# Patient Record
Sex: Male | Born: 1955 | Race: White | Hispanic: No | Smoking: Former smoker
Health system: Southern US, Community
[De-identification: ages and names within clinical notes are randomized; demographics above are authoritative.]

## PROBLEM LIST (undated history)

## (undated) DIAGNOSIS — E7800 Pure hypercholesterolemia, unspecified: Secondary | ICD-10-CM

## (undated) DIAGNOSIS — C61 Malignant neoplasm of prostate: Secondary | ICD-10-CM

## (undated) DIAGNOSIS — F419 Anxiety disorder, unspecified: Secondary | ICD-10-CM

## (undated) DIAGNOSIS — F319 Bipolar disorder, unspecified: Secondary | ICD-10-CM

## (undated) DIAGNOSIS — C3491 Malignant neoplasm of unspecified part of right bronchus or lung: Secondary | ICD-10-CM

## (undated) DIAGNOSIS — I2510 Atherosclerotic heart disease of native coronary artery without angina pectoris: Secondary | ICD-10-CM

## (undated) DIAGNOSIS — E039 Hypothyroidism, unspecified: Secondary | ICD-10-CM

## (undated) DIAGNOSIS — E230 Hypopituitarism: Secondary | ICD-10-CM

## (undated) DIAGNOSIS — F32A Depression, unspecified: Secondary | ICD-10-CM

## (undated) DIAGNOSIS — R972 Elevated prostate specific antigen [PSA]: Secondary | ICD-10-CM

## (undated) DIAGNOSIS — C801 Malignant (primary) neoplasm, unspecified: Secondary | ICD-10-CM

## (undated) DIAGNOSIS — F329 Major depressive disorder, single episode, unspecified: Secondary | ICD-10-CM

## (undated) HISTORY — PX: JOINT REPLACEMENT: SHX530

## (undated) HISTORY — DX: Bipolar disorder, unspecified: F31.9

## (undated) HISTORY — DX: Atherosclerotic heart disease of native coronary artery without angina pectoris: I25.10

## (undated) HISTORY — DX: Malignant neoplasm of unspecified part of right bronchus or lung: C34.91

## (undated) HISTORY — DX: Hypothyroidism, unspecified: E03.9

## (undated) HISTORY — DX: Anxiety disorder, unspecified: F41.9

## (undated) HISTORY — DX: Hypopituitarism: E23.0

## (undated) HISTORY — DX: Pure hypercholesterolemia, unspecified: E78.00

---

## 1998-03-27 ENCOUNTER — Emergency Department (HOSPITAL_COMMUNITY): Admission: EM | Admit: 1998-03-27 | Discharge: 1998-03-27 | Payer: Self-pay | Admitting: Emergency Medicine

## 2013-01-01 ENCOUNTER — Ambulatory Visit
Admission: RE | Admit: 2013-01-01 | Discharge: 2013-01-01 | Disposition: A | Discharge status: Home / Self Care | Payer: Managed Care, Other (non HMO) | Source: Ambulatory Visit | Attending: Neurosurgery | Admitting: Neurosurgery

## 2013-01-01 ENCOUNTER — Other Ambulatory Visit: Payer: Self-pay | Admitting: Neurosurgery

## 2013-01-01 DIAGNOSIS — R1900 Intra-abdominal and pelvic swelling, mass and lump, unspecified site: Secondary | ICD-10-CM

## 2013-01-01 MED ORDER — IOHEXOL 300 MG/ML  SOLN
100.0000 mL | Freq: Once | INTRAMUSCULAR | Status: AC | PRN
  Administered 2013-01-01: 100 mL via INTRAVENOUS

## 2013-01-01 MED ORDER — IOHEXOL 300 MG/ML  SOLN
30.0000 mL | Freq: Once | INTRAMUSCULAR | Status: AC | PRN
  Administered 2013-01-01: 30 mL via ORAL

## 2013-01-05 ENCOUNTER — Other Ambulatory Visit (HOSPITAL_COMMUNITY): Payer: Self-pay | Admitting: Neurosurgery

## 2013-01-05 DIAGNOSIS — M7989 Other specified soft tissue disorders: Secondary | ICD-10-CM

## 2013-06-02 ENCOUNTER — Ambulatory Visit: Payer: Managed Care, Other (non HMO) | Attending: Orthopedic Surgery | Admitting: Physical Therapy

## 2013-06-02 DIAGNOSIS — M25559 Pain in unspecified hip: Secondary | ICD-10-CM | POA: Insufficient documentation

## 2013-06-02 DIAGNOSIS — IMO0001 Reserved for inherently not codable concepts without codable children: Secondary | ICD-10-CM | POA: Insufficient documentation

## 2013-06-02 DIAGNOSIS — R5381 Other malaise: Secondary | ICD-10-CM | POA: Insufficient documentation

## 2013-06-02 DIAGNOSIS — R262 Difficulty in walking, not elsewhere classified: Secondary | ICD-10-CM | POA: Insufficient documentation

## 2013-06-04 ENCOUNTER — Ambulatory Visit: Payer: Managed Care, Other (non HMO) | Admitting: Physical Therapy

## 2013-06-11 ENCOUNTER — Ambulatory Visit: Payer: Managed Care, Other (non HMO) | Admitting: Physical Therapy

## 2013-06-12 ENCOUNTER — Ambulatory Visit: Payer: Managed Care, Other (non HMO) | Admitting: Physical Therapy

## 2013-06-16 ENCOUNTER — Ambulatory Visit: Payer: Managed Care, Other (non HMO) | Admitting: Physical Therapy

## 2013-06-18 ENCOUNTER — Ambulatory Visit: Payer: Managed Care, Other (non HMO) | Admitting: Physical Therapy

## 2013-06-23 ENCOUNTER — Ambulatory Visit: Payer: Managed Care, Other (non HMO) | Attending: Orthopedic Surgery | Admitting: Physical Therapy

## 2013-06-23 DIAGNOSIS — IMO0001 Reserved for inherently not codable concepts without codable children: Secondary | ICD-10-CM | POA: Insufficient documentation

## 2013-06-23 DIAGNOSIS — R262 Difficulty in walking, not elsewhere classified: Secondary | ICD-10-CM | POA: Insufficient documentation

## 2013-06-23 DIAGNOSIS — R5381 Other malaise: Secondary | ICD-10-CM | POA: Insufficient documentation

## 2013-06-23 DIAGNOSIS — M25559 Pain in unspecified hip: Secondary | ICD-10-CM | POA: Insufficient documentation

## 2013-06-25 ENCOUNTER — Ambulatory Visit: Payer: Managed Care, Other (non HMO) | Admitting: Physical Therapy

## 2013-06-30 ENCOUNTER — Encounter: Payer: Managed Care, Other (non HMO) | Admitting: Physical Therapy

## 2013-07-02 ENCOUNTER — Encounter: Payer: Managed Care, Other (non HMO) | Admitting: Physical Therapy

## 2013-07-07 ENCOUNTER — Encounter: Payer: Managed Care, Other (non HMO) | Admitting: Physical Therapy

## 2013-07-07 ENCOUNTER — Ambulatory Visit: Payer: Managed Care, Other (non HMO)

## 2013-07-09 ENCOUNTER — Encounter: Payer: Managed Care, Other (non HMO) | Admitting: Physical Therapy

## 2013-07-13 DIAGNOSIS — E2740 Unspecified adrenocortical insufficiency: Secondary | ICD-10-CM | POA: Insufficient documentation

## 2013-09-07 ENCOUNTER — Ambulatory Visit: Payer: Managed Care, Other (non HMO) | Attending: Medical Oncology | Admitting: Physical Therapy

## 2013-09-07 DIAGNOSIS — IMO0001 Reserved for inherently not codable concepts without codable children: Secondary | ICD-10-CM | POA: Insufficient documentation

## 2013-09-07 DIAGNOSIS — M25669 Stiffness of unspecified knee, not elsewhere classified: Secondary | ICD-10-CM | POA: Insufficient documentation

## 2013-09-07 DIAGNOSIS — C3490 Malignant neoplasm of unspecified part of unspecified bronchus or lung: Secondary | ICD-10-CM | POA: Insufficient documentation

## 2013-09-07 DIAGNOSIS — M25559 Pain in unspecified hip: Secondary | ICD-10-CM | POA: Insufficient documentation

## 2013-09-17 ENCOUNTER — Ambulatory Visit: Payer: Managed Care, Other (non HMO) | Admitting: Physical Therapy

## 2013-09-18 ENCOUNTER — Ambulatory Visit: Payer: Managed Care, Other (non HMO) | Admitting: Physical Therapy

## 2013-09-24 ENCOUNTER — Ambulatory Visit: Payer: Managed Care, Other (non HMO) | Attending: Medical Oncology | Admitting: Physical Therapy

## 2013-09-24 DIAGNOSIS — C3490 Malignant neoplasm of unspecified part of unspecified bronchus or lung: Secondary | ICD-10-CM | POA: Insufficient documentation

## 2013-09-24 DIAGNOSIS — M25669 Stiffness of unspecified knee, not elsewhere classified: Secondary | ICD-10-CM | POA: Insufficient documentation

## 2013-09-24 DIAGNOSIS — M25559 Pain in unspecified hip: Secondary | ICD-10-CM | POA: Insufficient documentation

## 2013-09-24 DIAGNOSIS — IMO0001 Reserved for inherently not codable concepts without codable children: Secondary | ICD-10-CM | POA: Insufficient documentation

## 2013-09-25 ENCOUNTER — Ambulatory Visit: Payer: Managed Care, Other (non HMO) | Admitting: Physical Therapy

## 2013-09-30 ENCOUNTER — Ambulatory Visit: Payer: Managed Care, Other (non HMO) | Admitting: Physical Therapy

## 2013-10-01 ENCOUNTER — Ambulatory Visit: Payer: Managed Care, Other (non HMO) | Admitting: Physical Therapy

## 2013-10-02 ENCOUNTER — Ambulatory Visit: Payer: Managed Care, Other (non HMO) | Admitting: Physical Therapy

## 2013-10-08 ENCOUNTER — Ambulatory Visit: Payer: Managed Care, Other (non HMO) | Admitting: Physical Therapy

## 2013-10-15 ENCOUNTER — Ambulatory Visit: Payer: Managed Care, Other (non HMO) | Admitting: Physical Therapy

## 2013-10-21 ENCOUNTER — Ambulatory Visit: Payer: Managed Care, Other (non HMO) | Attending: Medical Oncology | Admitting: Physical Therapy

## 2013-10-21 DIAGNOSIS — M25669 Stiffness of unspecified knee, not elsewhere classified: Secondary | ICD-10-CM | POA: Insufficient documentation

## 2013-10-21 DIAGNOSIS — C3490 Malignant neoplasm of unspecified part of unspecified bronchus or lung: Secondary | ICD-10-CM | POA: Insufficient documentation

## 2013-10-21 DIAGNOSIS — M25559 Pain in unspecified hip: Secondary | ICD-10-CM | POA: Insufficient documentation

## 2013-10-21 DIAGNOSIS — IMO0001 Reserved for inherently not codable concepts without codable children: Secondary | ICD-10-CM | POA: Insufficient documentation

## 2013-10-22 ENCOUNTER — Ambulatory Visit: Payer: Managed Care, Other (non HMO) | Admitting: Physical Therapy

## 2013-10-29 ENCOUNTER — Ambulatory Visit: Payer: Managed Care, Other (non HMO) | Admitting: Physical Therapy

## 2013-11-04 ENCOUNTER — Ambulatory Visit: Payer: Managed Care, Other (non HMO) | Admitting: Physical Therapy

## 2013-11-12 ENCOUNTER — Ambulatory Visit: Payer: Managed Care, Other (non HMO) | Admitting: Physical Therapy

## 2013-11-18 ENCOUNTER — Ambulatory Visit: Payer: Managed Care, Other (non HMO) | Attending: Medical Oncology | Admitting: Physical Therapy

## 2013-11-18 DIAGNOSIS — C3490 Malignant neoplasm of unspecified part of unspecified bronchus or lung: Secondary | ICD-10-CM | POA: Insufficient documentation

## 2013-11-18 DIAGNOSIS — IMO0001 Reserved for inherently not codable concepts without codable children: Secondary | ICD-10-CM | POA: Insufficient documentation

## 2013-11-18 DIAGNOSIS — M25669 Stiffness of unspecified knee, not elsewhere classified: Secondary | ICD-10-CM | POA: Insufficient documentation

## 2013-11-18 DIAGNOSIS — M25559 Pain in unspecified hip: Secondary | ICD-10-CM | POA: Insufficient documentation

## 2013-11-25 ENCOUNTER — Ambulatory Visit: Payer: Managed Care, Other (non HMO) | Admitting: Physical Therapy

## 2013-11-27 ENCOUNTER — Ambulatory Visit: Payer: Managed Care, Other (non HMO) | Admitting: Physical Therapy

## 2014-09-08 ENCOUNTER — Ambulatory Visit (INDEPENDENT_AMBULATORY_CARE_PROVIDER_SITE_OTHER): Payer: Managed Care, Other (non HMO) | Admitting: Psychiatry

## 2014-09-08 DIAGNOSIS — F0630 Mood disorder due to known physiological condition, unspecified: Secondary | ICD-10-CM

## 2015-04-18 DIAGNOSIS — C799 Secondary malignant neoplasm of unspecified site: Secondary | ICD-10-CM | POA: Insufficient documentation

## 2016-07-13 ENCOUNTER — Inpatient Hospital Stay (HOSPITAL_COMMUNITY)
Admission: EM | Admit: 2016-07-13 | Discharge: 2016-07-14 | DRG: 054 | Disposition: A | Discharge status: Home / Self Care | Payer: Managed Care, Other (non HMO) | Attending: Internal Medicine | Admitting: Internal Medicine

## 2016-07-13 ENCOUNTER — Encounter (HOSPITAL_COMMUNITY): Payer: Self-pay | Admitting: Emergency Medicine

## 2016-07-13 ENCOUNTER — Emergency Department (HOSPITAL_COMMUNITY): Payer: Managed Care, Other (non HMO)

## 2016-07-13 DIAGNOSIS — Z801 Family history of malignant neoplasm of trachea, bronchus and lung: Secondary | ICD-10-CM

## 2016-07-13 DIAGNOSIS — Z85118 Personal history of other malignant neoplasm of bronchus and lung: Secondary | ICD-10-CM

## 2016-07-13 DIAGNOSIS — C3490 Malignant neoplasm of unspecified part of unspecified bronchus or lung: Secondary | ICD-10-CM

## 2016-07-13 DIAGNOSIS — I81 Portal vein thrombosis: Secondary | ICD-10-CM | POA: Diagnosis present

## 2016-07-13 DIAGNOSIS — D72829 Elevated white blood cell count, unspecified: Secondary | ICD-10-CM | POA: Diagnosis present

## 2016-07-13 DIAGNOSIS — Z79899 Other long term (current) drug therapy: Secondary | ICD-10-CM

## 2016-07-13 DIAGNOSIS — S069X0A Unspecified intracranial injury without loss of consciousness, initial encounter: Secondary | ICD-10-CM | POA: Diagnosis not present

## 2016-07-13 DIAGNOSIS — R270 Ataxia, unspecified: Secondary | ICD-10-CM

## 2016-07-13 DIAGNOSIS — C3491 Malignant neoplasm of unspecified part of right bronchus or lung: Secondary | ICD-10-CM | POA: Diagnosis not present

## 2016-07-13 DIAGNOSIS — I10 Essential (primary) hypertension: Secondary | ICD-10-CM | POA: Diagnosis present

## 2016-07-13 DIAGNOSIS — Z923 Personal history of irradiation: Secondary | ICD-10-CM

## 2016-07-13 DIAGNOSIS — R413 Other amnesia: Secondary | ICD-10-CM | POA: Diagnosis present

## 2016-07-13 DIAGNOSIS — Z803 Family history of malignant neoplasm of breast: Secondary | ICD-10-CM

## 2016-07-13 DIAGNOSIS — R260 Ataxic gait: Secondary | ICD-10-CM | POA: Diagnosis present

## 2016-07-13 DIAGNOSIS — C7931 Secondary malignant neoplasm of brain: Secondary | ICD-10-CM | POA: Diagnosis not present

## 2016-07-13 DIAGNOSIS — W109XXA Fall (on) (from) unspecified stairs and steps, initial encounter: Secondary | ICD-10-CM | POA: Diagnosis present

## 2016-07-13 DIAGNOSIS — C7951 Secondary malignant neoplasm of bone: Secondary | ICD-10-CM | POA: Diagnosis present

## 2016-07-13 DIAGNOSIS — G936 Cerebral edema: Secondary | ICD-10-CM | POA: Diagnosis present

## 2016-07-13 DIAGNOSIS — Z66 Do not resuscitate: Secondary | ICD-10-CM | POA: Diagnosis present

## 2016-07-13 DIAGNOSIS — Z7901 Long term (current) use of anticoagulants: Secondary | ICD-10-CM

## 2016-07-13 DIAGNOSIS — Z79890 Hormone replacement therapy (postmenopausal): Secondary | ICD-10-CM

## 2016-07-13 DIAGNOSIS — G893 Neoplasm related pain (acute) (chronic): Secondary | ICD-10-CM | POA: Diagnosis present

## 2016-07-13 DIAGNOSIS — F17290 Nicotine dependence, other tobacco product, uncomplicated: Secondary | ICD-10-CM | POA: Diagnosis present

## 2016-07-13 DIAGNOSIS — R296 Repeated falls: Secondary | ICD-10-CM | POA: Diagnosis present

## 2016-07-13 DIAGNOSIS — Z7952 Long term (current) use of systemic steroids: Secondary | ICD-10-CM

## 2016-07-13 DIAGNOSIS — C787 Secondary malignant neoplasm of liver and intrahepatic bile duct: Secondary | ICD-10-CM | POA: Diagnosis present

## 2016-07-13 DIAGNOSIS — I619 Nontraumatic intracerebral hemorrhage, unspecified: Secondary | ICD-10-CM

## 2016-07-13 DIAGNOSIS — E2740 Unspecified adrenocortical insufficiency: Secondary | ICD-10-CM | POA: Diagnosis present

## 2016-07-13 HISTORY — DX: Malignant neoplasm of unspecified part of unspecified bronchus or lung: C34.90

## 2016-07-13 HISTORY — DX: Malignant (primary) neoplasm, unspecified: C80.1

## 2016-07-13 LAB — I-STAT CHEM 8, ED
BUN: 44 mg/dL — ABNORMAL HIGH (ref 6–20)
CHLORIDE: 102 mmol/L (ref 101–111)
CREATININE: 1.6 mg/dL — AB (ref 0.61–1.24)
Calcium, Ion: 1.02 mmol/L — ABNORMAL LOW (ref 1.15–1.40)
GLUCOSE: 83 mg/dL (ref 65–99)
HCT: 49 % (ref 39.0–52.0)
Hemoglobin: 16.7 g/dL (ref 13.0–17.0)
POTASSIUM: 4.3 mmol/L (ref 3.5–5.1)
Sodium: 136 mmol/L (ref 135–145)
TCO2: 23 mmol/L (ref 0–100)

## 2016-07-13 LAB — COMPREHENSIVE METABOLIC PANEL
ALBUMIN: 4.2 g/dL (ref 3.5–5.0)
ALK PHOS: 47 U/L (ref 38–126)
ALT: 25 U/L (ref 17–63)
AST: 28 U/L (ref 15–41)
Anion gap: 13 (ref 5–15)
BUN: 27 mg/dL — ABNORMAL HIGH (ref 6–20)
CALCIUM: 8.9 mg/dL (ref 8.9–10.3)
CO2: 20 mmol/L — AB (ref 22–32)
CREATININE: 1.54 mg/dL — AB (ref 0.61–1.24)
Chloride: 102 mmol/L (ref 101–111)
GFR calc non Af Amer: 47 mL/min — ABNORMAL LOW (ref >60)
GFR, EST AFRICAN AMERICAN: 55 mL/min — AB (ref >60)
GLUCOSE: 91 mg/dL (ref 65–99)
Potassium: 3.2 mmol/L — ABNORMAL LOW (ref 3.5–5.1)
SODIUM: 135 mmol/L (ref 135–145)
Total Bilirubin: 0.2 mg/dL — ABNORMAL LOW (ref 0.3–1.2)
Total Protein: 7.2 g/dL (ref 6.5–8.1)

## 2016-07-13 LAB — DIFFERENTIAL
Basophils Absolute: 0 10*3/uL (ref 0.0–0.1)
Basophils Relative: 0 %
Eosinophils Absolute: 0 10*3/uL (ref 0.0–0.7)
Eosinophils Relative: 0 %
LYMPHS ABS: 1.8 10*3/uL (ref 0.7–4.0)
LYMPHS PCT: 15 %
MONO ABS: 1 10*3/uL (ref 0.1–1.0)
Monocytes Relative: 9 %
NEUTROS ABS: 9.1 10*3/uL — AB (ref 1.7–7.7)
Neutrophils Relative %: 76 %

## 2016-07-13 LAB — PROTIME-INR
INR: 0.96
PROTHROMBIN TIME: 12.8 s (ref 11.4–15.2)

## 2016-07-13 LAB — CBG MONITORING, ED: Glucose-Capillary: 96 mg/dL (ref 65–99)

## 2016-07-13 LAB — CBC
HEMATOCRIT: 46 % (ref 39.0–52.0)
HEMOGLOBIN: 16.4 g/dL (ref 13.0–17.0)
MCH: 32.4 pg (ref 26.0–34.0)
MCHC: 35.7 g/dL (ref 30.0–36.0)
MCV: 90.9 fL (ref 78.0–100.0)
PLATELETS: 229 10*3/uL (ref 150–400)
RBC: 5.06 MIL/uL (ref 4.22–5.81)
RDW: 12.7 % (ref 11.5–15.5)
WBC: 12 10*3/uL — AB (ref 4.0–10.5)

## 2016-07-13 LAB — APTT: aPTT: 25 seconds (ref 24–36)

## 2016-07-13 LAB — I-STAT TROPONIN, ED: Troponin i, poc: 0 ng/mL (ref 0.00–0.08)

## 2016-07-13 NOTE — Consult Note (Addendum)
Neurology Consultation Reason for Consult: Altered mental status Referring Physician: Sabra Heck, B  CC: Altered mental status  History is obtained from: Patient, family  HPI: Jeffery Cobb is a 60 y.o. male with a history of brain mets from lung cancer, Not currently undergoing treatment. He was in his normal state of health tonight until around 6 PM at which time, his wife for him fall down stairs. He was not confused prior to this, he was not having any difficulty with his gait. it is unclear what caused him to fall. After the fall, he has been having difficulty with his short-term memory and has been having a slightly unsteady gait.   Because of the acute change, he is brought in as a code stroke. CT scan in the emergency department revealed small punctate areas of hyperdensity associated with his metastasis consistent with tiny foci of hemorrhage.   Also of note, he is on anticoagulation due to portal vein thrombosis.    ROS: A 14 point ROS was performed and is negative except as noted in the HPI.   Past medical history: Lung cancer with metastasis to the brain. This was treated with some type of immunotherapy, currently not undergiong treatment.    Family history: No history of similiar   Social History: uses marijuana  Exam: Current vital signs: BP 110/68 (BP Location: Right Arm)   Pulse 73   Temp 98.9 F (37.2 C) (Oral)   Resp 20   Wt 93.5 kg (206 lb 2.1 oz)   SpO2 98%  Vital signs in last 24 hours: Temp:  [98.9 F (37.2 C)] 98.9 F (37.2 C) (11/24 2122) Pulse Rate:  [73] 73 (11/24 2122) Resp:  [20] 20 (11/24 2122) BP: (110)/(68) 110/68 (11/24 2122) SpO2:  [97 %-98 %] 98 % (11/24 2140) Weight:  [93.5 kg (206 lb 2.1 oz)] 93.5 kg (206 lb 2.1 oz) (11/24 2145)   Physical Exam  Constitutional: Appears well-developed and well-nourished.  Psych: Affect appropriate to situation Eyes: No scleral injection HENT: No OP obstrucion Head: Normocephalic.  Cardiovascular:  Normal rate and regular rhythm.  Respiratory: Effort normal and breath sounds normal to anterior ascultation GI: Soft.  No distension. There is no tenderness.  Skin: WDI  Neuro: Mental Status: Patient is awake, alert, oriented to person, place, month, year, and situation. No signs of aphasia or neglect He is able to give # of quarters in $2.75 He has significant short temr memory impairment, unable to remember getting CT scan 15 minutes after it occurred.  Cranial Nerves: II: Visual Fields are full. Pupils are equal, round, and reactive to light.   III,IV, VI: EOMI without ptosis or diploplia.  V: Facial sensation is symmetric to temperature VII: Facial movement is mild left facial weakness.  VIII: hearing is intact to voice X: Uvula elevates symmetrically XI: Shoulder shrug is symmetric. XII: tongue is midline without atrophy or fasciculations.  Motor: Tone is normal. Bulk is normal. 5/5 strength was present on the right, he has mild left arm weakness.  Sensory: Sensation is symmetric to light touch and temperature in the arms and legs. Cerebellar: FNF  intact on right, consistent with weakness on left.    I have reviewed labs in epic and the results pertinent to this consultation are: Leukocytosis, elevated creatinine  I have reviewed the images obtained: CT head- punctate foci of hemorrhage, mostly associated with previous tumors.   Impression: 60 yo M with punctate foci of hemorrhage and MS change after falling down the stairs.  This was unwitnessed and he has no clear head injury, I strongly suspect that he did hit his head falling down. This would explain both symptoms and scan.   Recommendations: 1) Hold anticoagulation, would discuss with neurosurgery timing for resumption.  2) repeat imaging in the morning, would favor MRI brain w/wo given history.   Roland Rack, MD [facility] [phone]  If 7pm- 7am, please page neurology on call as listed in  [place].

## 2016-07-13 NOTE — ED Provider Notes (Signed)
I saw and evaluated the patient, reviewed the resident's note and I agree with the findings and plan.  Pertinent History: The patient is a 60 year old male, he has a known history of metastatic cancer, he has metastatic lesions to his brain, he had a fall this evening down she stares, there was a head injury involved, he had several falls after that as well. The family noticed that he was having some difficulty with confusion, some difficulty with answering questions appropriately, known to have some facial droop as well at baseline.  Pertinent Exam findings: On exam the patient has the ability to follow all of my commands, there is a slight facial droop, normal heart and lung sounds, follows commands  Neurology was consulted, apparently initially the patient was called a code stroke however at this time the patient appears to have more of a traumatic brain injury. There are some spots of what appear some small hemorrhages surrounding the metastatic lesions, neurology request neurosurgery consultation, admission and repeat CT in the morning. The patient is on Lovenox for portal vein thrombosis  D/W Dr. Saintclair Halsted - agrees to no pursue aggressive treatment.  Will need admission and repeat Ct in the morning.    I personally interpreted the EKG as well as the resident and agree with the interpretation on the resident's chart.  Final diagnoses:  Ataxia  Brain metastases ([place])  History of lung cancer  Multiple falls      Noemi Chapel, MD 07/14/16 1440

## 2016-07-13 NOTE — H&P (Signed)
Jeffery Cobb CZY:[id number] DOB: 06/22/1956 DOA: 07/13/2016     PCP: No primary care provider on file.   Outpatient Specialists: Oncology Hematology  At [facility] Patient coming from:    home Lives With family    Chief Complaint: fall and confusion  HPI: Jeffery Cobb is a 60 y.o. 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 He had a total of 3 falls he is unable to recollect what happened has been having abnormal walking wife states he has fallen down the stairs but then may have had some subsequent falls as well. Prior to falling down the stairs he was not confused. It seems that all the difficulties and confusion have started after the initial fall been having trouble with short-term memory Patient has right-sided droop but that's unchanged from baseline secondary to history of stage IV lung cancer metastatic spread to the brain. Patient is currently on immunotherapy only followed at [facility] patient has chronic hip pain secondary to metastatic spread and has been getting anesthetic as well as steroid injections last time was done yesterday.   Initially brought in as a code stroke which was canceled. Plan of care has been discussed with patient and his wife his wife wishes for patient to be currently admitted to the hospital for monitoring. Patient and his wife postdated concentration care should be on comfort and palliation they would not be interested in aggressive interventions that they're concerned the patient is unstable on his feet. 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 He has metastatic disease to the inguinal lymph nodes, liver, bilateral adrenal glands, brain and right femur. Last treatment 08/2014 with surveillance CTs without evidence of relapse. CT scan 04/11/2016 with no evidence of cancer progression, but new finding of nonocclusive main portal vein thrombus. He was begun on therapeutic Lovenox      IN ER:  Temp (24hrs), Avg:98.9 F (37.2 C), Min:98.9 F (37.2 C), Max:98.9 F (37.2 C)      rr 16 BP 96/63  Na 136 K 4.3 cr 1.6 trop 0.0   CT head: Multifocal frontal lobes metastatic disease small foci of hyperdensity in both frontal poles small foci of hemorrhage associated brain metastases Following Medications were ordered in ER: Medications - No data to display   ER provider discussed case with: Neurology who recommended admission and follow-up MRI in a.m. to evaluate for any evidence of progression of bleeding will hold Lovenox for now. Neurosurgery has also been called and also recommend a repeat imaging  Hospitalist was called for admission for intracranial bleeding secondary to metastatic lung cancer after a fall while on Lovenox  Review of Systems:    Pertinent positives include: confusion, gait abnormality  Constitutional:  No weight loss, night sweats, Fevers, chills, fatigue, weight loss  HEENT:  No headaches, Difficulty swallowing,Tooth/dental problems,Sore throat,  No sneezing, itching, ear ache, nasal congestion, post nasal drip,  Cardio-vascular:  No chest pain, Orthopnea, PND, anasarca, dizziness, palpitations.no Bilateral lower extremity swelling  GI:  No heartburn, indigestion, abdominal pain, nausea, vomiting, diarrhea, change in bowel habits,  loss of appetite, melena, blood in stool, hematemesis Resp:  no shortness of breath at rest. No dyspnea on exertion, No excess mucus, no productive cough, No non-productive cough, No coughing up of blood.No change in color of mucus.No  wheezing. Skin:  no rash or lesions. No jaundice GU:  no dysuria, change in color of urine, no urgency or frequency. No straining to urinate.  No flank pain.  Musculoskeletal:  No joint pain or no joint swelling. No decreased range of motion. No back pain.  Psych:  No change in mood or affect. No depression or anxiety. No memory loss.  Neuro: no localizing neurological complaints, no tingling, no weakness, no double vision, no , no slurred speech, no   As per HPI otherwise 10 point review of systems negative.   Past Medical History: Past Medical History:  Diagnosis Date  . Cancer [facility])    lung   History reviewed. No pertinent surgical history.   Social History:  Ambulatory   independently      reports that he quit smoking about 30 years ago. He uses smokeless tobacco. His alcohol and drug histories are not on file.  Allergies:  No Known Allergies     Family History:   Family History  Problem Relation Age of Onset  . Lung cancer Mother   . Breast cancer Sister     Medications: Prior to Admission medications   Medication Sig Start Date End Date Taking? Authorizing Provider  citalopram (CELEXA) 20 MG tablet Take 30 mg by mouth daily. 06/26/16  Yes Historical Provider, MD  enoxaparin (LOVENOX) 80 MG/0.8ML injection Inject 0.8 mLs into the skin every 12 (twelve) hours. 06/25/16  Yes Historical Provider, MD  hydrocortisone (CORTEF) 5 MG tablet Take 20 mg by mouth every morning.  02/08/15  Yes Historical Provider, MD  LORazepam (ATIVAN) 1 MG tablet Take 1 mg by mouth every 8 (eight) hours as needed for anxiety. 06/08/16  Yes Historical Provider, MD  rosuvastatin (CRESTOR) 5 MG tablet Take 5 mg by mouth daily. 06/22/16  Yes Historical Provider, MD  Testosterone (ANDROGEL) 20.25 MG/1.25GM (1.62%) GEL Apply 2 packets topically See admin instructions. To the shoulders and or upper arms once a day 03/07/16  Yes Historical Provider, MD  testosterone cypionate (DEPOTESTOSTERONE  CYPIONATE) 200 MG/ML injection Inject 1 mL into the muscle See admin instructions. Every 10 days 02/03/16  Yes Historical Provider, MD    Physical Exam: Patient Vitals for the past 24 hrs:  BP Temp Temp src Pulse Resp SpO2 Weight  07/13/16 2230 105/71 - - 69 13 92 % -  07/13/16 2200 108/73 - - 67 16 95 % -  07/13/16 2145 - - - - - - 93.5 kg (206 lb 2.1 oz)  07/13/16 2140 - - - - - 98 % -  07/13/16 2130 106/68 - - 71 15 96 % -  07/13/16 2122 110/68 98.9 F (37.2 C) Oral 73 20 97 % -  07/13/16 2100 - - - - - - 93.5 kg (206 lb 2.1 oz)    1. General:  in No Acute distress 2. Psychological: Alert and   Oriented  3. Head/ENT:     Dry Mucous Membranes                          Head Non traumatic, neck supple  Normal   Dentition 4. SKIN:  decreased Skin turgor,  Skin clean Dry and intact no rash 5. Heart: Regular rate and rhythm no Murmur, Rub or gallop 6. Lungs:  Clear to auscultation bilaterally, no wheezes or crackles   7. Abdomen: Soft, non-tender, Non distended 8. Lower extremities: no clubbing, cyanosis, or edema 9. Neurologically   strength 5 out of 5 in all 4 extremities mild right facial droop 10. MSK: Normal range of motion   body mass index is unknown because there is no height or weight on file.  Labs on Admission:   Labs on Admission: I have personally reviewed following labs and imaging studies  CBC:  Recent Labs Lab 07/13/16 2109 07/13/16 2139  WBC 12.0*  --   NEUTROABS 9.1*  --   HGB 16.4 16.7  HCT 46.0 49.0  MCV 90.9  --   PLT 229  --    Basic Metabolic Panel:  Recent Labs Lab 07/13/16 2109 07/13/16 2139  NA 135 136  K 3.2* 4.3  CL 102 102  CO2 20*  --   GLUCOSE 91 83  BUN 27* 44*  CREATININE 1.54* 1.60*  CALCIUM 8.9  --    GFR: CrCl cannot be calculated (Unknown ideal weight.). Liver Function Tests:  Recent Labs Lab 07/13/16 2109  AST 28  ALT 25  ALKPHOS 47  BILITOT 0.2*  PROT 7.2  ALBUMIN 4.2   No results  for input(s): LIPASE, AMYLASE in the last 168 hours. No results for input(s): AMMONIA in the last 168 hours. Coagulation Profile:  Recent Labs Lab 07/13/16 2109  INR 0.96   Cardiac Enzymes: No results for input(s): CKTOTAL, CKMB, CKMBINDEX, TROPONINI in the last 168 hours. BNP (last 3 results) No results for input(s): PROBNP in the last 8760 hours. HbA1C: No results for input(s): HGBA1C in the last 72 hours. CBG:  Recent Labs Lab 07/13/16 2111  GLUCAP 96   Lipid Profile: No results for input(s): CHOL, HDL, LDLCALC, TRIG, CHOLHDL, LDLDIRECT in the last 72 hours. Thyroid Function Tests: No results for input(s): TSH, T4TOTAL, FREET4, T3FREE, THYROIDAB in the last 72 hours. Anemia Panel: No results for input(s): VITAMINB12, FOLATE, FERRITIN, TIBC, IRON, RETICCTPCT in the last 72 hours. Urine analysis: No results found for: COLORURINE, APPEARANCEUR, LABSPEC, PHURINE, GLUCOSEU, HGBUR, BILIRUBINUR, KETONESUR, PROTEINUR, UROBILINOGEN, NITRITE, LEUKOCYTESUR Sepsis Labs: '@LABRCNTIP'$ (procalcitonin:4,lacticidven:4) )No results found for this or any previous visit (from the past 240 hour(s)).     UA  not ordered  No results found for: HGBA1C  CrCl cannot be calculated (Unknown ideal weight.).  BNP (last 3 results) No results for input(s): PROBNP in the last 8760 hours.   ECG REPORT  Independently reviewed Rate:75  Rhythm: NSR ST&T Change: No acute ischemic changes  QTC 436  Filed Weights   07/13/16 2100 07/13/16 2145  Weight: 93.5 kg (206 lb 2.1 oz) 93.5 kg (206 lb 2.1 oz)     Cultures: No results found for: SDES, [place], CULT, REPTSTATUS   Radiological Exams on Admission: Ct Head Code Stroke W/o Cm  Result Date: 07/13/2016 CLINICAL DATA:  Code stroke.  Confusion and memory difficulty EXAM: CT HEAD WITHOUT CONTRAST TECHNIQUE: Contiguous axial images were obtained from the base of the skull through the vertex without intravenous contrast. COMPARISON:  None.  FINDINGS: Brain: There is focal hypoattenuation within the superior right frontal lobe, both frontal poles and the left frontal lobe white matter, and vasogenic edema pattern compatible with the patient's reported brain metastases. There is a small focus  of hyperdensity in the left frontal pole (axial image 25), right frontal pole (axial image 21 close for and at the right vertex (axial image 28) superimposed on these areas of vasogenic edema. No midline shift or mass effect. No hydrocephalus. No extra-axial collection. Vascular: Mild atherosclerotic calcification of the vertebral and internal carotid arteries at the skullbase. Skull: No skull fracture or focal calvarial lesion. Visualized skull base is normal. Sinuses/Orbits: The visualized portions of the paranasal sinuses and mastoid air cells are free of fluid. No advanced mucosal thickening. The visualized orbits are normal. Other: None ASPECTS ([place] Stroke Program Early CT Score) - Ganglionic level infarction (caudate, lentiform nuclei, internal capsule, insula, M1-M3 cortex): 7 - Supraganglionic infarction (M4-M6 cortex): 3 Total score (0-10 with 10 being normal): 10 IMPRESSION: 1. Multifocal invasion edema within both frontal lobes, consistent with reported brain metastases. 2. Small foci of hyperdensity at both frontal poles and at the right vertex are most consistent with small foci of hemorrhage associated with brain metastases. 3. ASPECTS is 10.  No CT evidence of acute cortical infarct. These results were called by telephone at the time of interpretation on 07/13/2016 at 9:30 pm to Dr. Roland Rack, who verbally acknowledged these results. Electronically Signed   By: Ulyses Jarred M.D.   On: 07/13/2016 21:37    Chart has been reviewed    Assessment/Plan  60 y.o. 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 Metastatic lung cancer (metastasis from lung to other site) /. Brain metastases ([place]) check this point would not concentrate on comfort measures only  . ICH (intracerebral hemorrhage) (HCC) -mild patient and family not interested in any interventions but would like to have his ataxia and instability addressed. We'll have physical therapy evaluate obtain MRI in in a.m. per recommendations of neurology to see for is any progression of a bleeding to help prognostication . Portal vein thrombosis hold Lovenox given active intracranial bleeding patient would like his [facility] oncologist to be notified regarding this  . Short-term memory losspatient states this is chronic but per family at this has worsened since a fall Gait instability and ataxia O have physical therapy evaluate for needs at home    Other plan as per orders.  DVT prophylaxis:  SCD      Code Status:  DNR/DNI concentration on comfort measures   as per patient    Family Communication:   Family not  at  Bedside    Disposition Plan:     To home once workup is complete and patient is stable                        Would benefit from PT/OT eval prior to DC   ordered                                   Consults called: neurology and neurosurgery   Admission status:  obs   Level of care      medical floor           I have spent a total of 56 min on this admission   Anais Denslow 07/14/2016, 1:48 AM  [facility]  Pager [phone]   after 2 AM please page floor coverage PA If 7AM-7PM, please contact the day team taking care of the patient  [email or web address]  Password [email or web address]

## 2016-07-13 NOTE — ED Notes (Signed)
Paged neurosurgery

## 2016-07-13 NOTE — ED Provider Notes (Signed)
Jeffery Cobb Provider Note   CSN: [id number] Arrival date & time: 07/13/16  2106     History   Chief Complaint Chief Complaint  Patient presents with  . Code Stroke    HPI Jeffery Cobb is a 60 y.o. male.  The history is provided by the patient, the spouse and medical records. No language interpreter was used.   Patient is a 60 year old male with past medical history of stage IV lung cancer and hypertension who presents via EMS as a code stroke. Per EMS report patient's wife reported that patient's has been off balance and he has had 3 falls starting at 7:30 PM today, including an unwitnessed fall down a flight of stairs. In addition he appears mildly confused and is having some difficulty with short-term memory, which is new. Patient is currently on Lovenox for portal vein thrombus. 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 However patient has not had any treatment in the past 1.5 years.   Past Medical History:  Diagnosis Date  . Cancer [facility])    lung    Patient Active Problem List   Diagnosis Date Noted  . ICH (intracerebral hemorrhage) ([place]) 07/14/2016  . Portal vein thrombosis 07/14/2016  . Short-term memory loss 07/14/2016  . Brain metastases ([place]) 07/14/2016  . Metastatic lung cancer (metastasis from lung to other site) ([place]) 07/13/2016  . Brain metastasis ([place]) 07/13/2016    History reviewed. No pertinent surgical history.     Home Medications    Prior to Admission medications   Medication Sig Start Date End Date Taking? Authorizing Provider  citalopram (CELEXA) 20 MG tablet Take 30 mg by mouth daily. 06/26/16  Yes Historical Provider, MD  enoxaparin (LOVENOX) 80 MG/0.8ML injection Inject 0.8 mLs into the skin every 12 (twelve) hours. 06/25/16  Yes Historical Provider, MD  hydrocortisone (CORTEF) 5 MG tablet Take 20 mg by mouth every morning.  02/08/15  Yes Historical Provider, MD  LORazepam  (ATIVAN) 1 MG tablet Take 1 mg by mouth every 8 (eight) hours as needed for anxiety. 06/08/16  Yes Historical Provider, MD  rosuvastatin (CRESTOR) 5 MG tablet Take 5 mg by mouth daily. 06/22/16  Yes Historical Provider, MD  Testosterone (ANDROGEL) 20.25 MG/1.25GM (1.62%) GEL Apply 2 packets topically See admin instructions. To the shoulders and or upper arms once a day 03/07/16  Yes Historical Provider, MD  testosterone cypionate (DEPOTESTOSTERONE CYPIONATE) 200 MG/ML injection Inject 1 mL into the muscle See admin instructions. Every 10 days 02/03/16  Yes Historical Provider, MD    Family History Family History  Problem Relation Age of Onset  . Lung cancer Mother   . Breast cancer Sister     Social History Social History  Substance Use Topics  . Smoking status: Former Smoker    Quit date: 1987  . Smokeless tobacco: Current User  . Alcohol use Not on file     Allergies   Patient has no known allergies.   Review of Systems Review of Systems  Constitutional: Negative for chills and fever.  HENT: Negative for ear pain and sore throat.   Eyes: Negative for visual disturbance.  Respiratory: Negative for cough and shortness of breath.   Cardiovascular: Negative for chest pain and palpitations.  Gastrointestinal: Negative for abdominal pain and vomiting.  Genitourinary: Negative for dysuria and hematuria.  Musculoskeletal: Positive for gait problem and neck pain. Negative for arthralgias and back pain.  Skin: Negative for color change and rash.  Neurological: Negative for seizures and syncope.  Hematological:       Currently on Lovenox for portal vein thrombosis  Psychiatric/Behavioral: Positive for confusion (difficulty with short-term memory).  All other systems reviewed and are negative.    Physical Exam Updated Vital Signs BP 96/63   Pulse 70   Temp 97.9 F (36.6 C)   Resp 16   Wt 93.5 kg   SpO2 95%   Physical Exam  Constitutional: He is oriented to person, place,  and time. He appears well-developed and well-nourished.  HENT:  Head: Normocephalic and atraumatic.  Eyes: Conjunctivae are normal.  Neck: Neck supple.  TTP over right cervical paraspinal muscles  Cardiovascular: Normal rate and regular rhythm.   No murmur heard. Pulmonary/Chest: Effort normal and breath sounds normal. No respiratory distress.  Abdominal: Soft. There is no tenderness.  Musculoskeletal: He exhibits no edema.  Small bruise over left hip at injection site  Neurological: He is alert and oriented to person, place, and time. He has normal strength. No sensory deficit.  Mild right facial droop (reportedly baseline per wife). Gait not tested due to ataxia.  Skin: Skin is warm and dry.  Psychiatric: He has a normal mood and affect.  Nursing note and vitals reviewed.    ED Treatments / Results  Labs (all labs ordered are listed, but only abnormal results are displayed) Labs Reviewed  CBC - Abnormal; Notable for the following:       Result Value   WBC 12.0 (*)    All other components within normal limits  DIFFERENTIAL - Abnormal; Notable for the following:    Neutro Abs 9.1 (*)    All other components within normal limits  COMPREHENSIVE METABOLIC PANEL - Abnormal; Notable for the following:    Potassium 3.2 (*)    CO2 20 (*)    BUN 27 (*)    Creatinine, Ser 1.54 (*)    Total Bilirubin 0.2 (*)    GFR calc non Af Amer 47 (*)    GFR calc Af Amer 55 (*)    All other components within normal limits  I-STAT CHEM 8, ED - Abnormal; Notable for the following:    BUN 44 (*)    Creatinine, Ser 1.60 (*)    Calcium, Ion 1.02 (*)    All other components within normal limits  PROTIME-INR  APTT  I-STAT TROPOININ, ED  CBG MONITORING, ED    EKG  EKG Interpretation None       Radiology Ct Head Code Stroke W/o Cm  Result Date: 07/13/2016 CLINICAL DATA:  Code stroke.  Confusion and memory difficulty EXAM: CT HEAD WITHOUT CONTRAST TECHNIQUE: Contiguous axial images were  obtained from the base of the skull through the vertex without intravenous contrast. COMPARISON:  None. FINDINGS: Brain: There is focal hypoattenuation within the superior right frontal lobe, both frontal poles and the left frontal lobe white matter, and vasogenic edema pattern compatible with the patient's reported brain metastases. There is a small focus of hyperdensity in the left frontal pole (axial image 25), right frontal pole (axial image 21 close for and at the right vertex (axial image 28) superimposed on these areas of vasogenic edema. No midline shift or mass effect. No hydrocephalus. No extra-axial collection. Vascular: Mild atherosclerotic calcification of the vertebral and internal carotid arteries at the skullbase. Skull: No skull fracture or focal calvarial lesion. Visualized skull base is normal. Sinuses/Orbits: The visualized portions of the paranasal sinuses and mastoid air cells are free of fluid.  No advanced mucosal thickening. The visualized orbits are normal. Other: None ASPECTS ([place] Stroke Program Early CT Score) - Ganglionic level infarction (caudate, lentiform nuclei, internal capsule, insula, M1-M3 cortex): 7 - Supraganglionic infarction (M4-M6 cortex): 3 Total score (0-10 with 10 being normal): 10 IMPRESSION: 1. Multifocal invasion edema within both frontal lobes, consistent with reported brain metastases. 2. Small foci of hyperdensity at both frontal poles and at the right vertex are most consistent with small foci of hemorrhage associated with brain metastases. 3. ASPECTS is 10.  No CT evidence of acute cortical infarct. These results were called by telephone at the time of interpretation on 07/13/2016 at 9:30 pm to Dr. Roland Rack, who verbally acknowledged these results. Electronically Signed   By: Ulyses Jarred M.D.   On: 07/13/2016 21:37    Procedures Procedures (including critical care time)  Medications Ordered in ED Medications - No data to display   Initial  Impression / Assessment and Plan / ED Course  I have reviewed the triage vital signs and the nursing notes.  Pertinent labs & imaging results that were available during my care of the patient were reviewed by me and considered in my medical decision making (see chart for details).  Clinical Course     Patient is a 60 year old male with past medical history of metastatic lung cancer who presents with concern for gait instability falls and altered mental status. Presentation patient is alert and oriented 3. No focal deficits on initial exam other than baseline facial droop. Patient was sent to the CT scanner and had CT head without contrast performed showing edema and small foci of hemorrhage associated with his brain metastases, consistent with TBI. No midline shift. Of note patient is currently anticoagulated on Lovenox for known portal vein thrombosis. EKG sinus rhythm without acute ischemic changes. Glucose within normal limits. Labs with mild leukocytosis, no anemia noted from with that opinion. Leukocytosis may be attributable to patient receiving a steroid injection 2 days ago for hip pain. Mild hypokalemia. Mildly elevated BUN and creatinine. Troponin unremarkable. Patient was evaluated by neurology in the emergency department. Recommended holding Lovenox and admission for repeat imaging tomorrow; also advised discussion of imaging with [facility]. Patient was discussed with Dr. Saintclair Halsted of neurosurgery by telephone who do not recommend any acute intervention. Will defer to neurology regarding Lovenox and further management. Hospitalist was consulted for admission of patient. Patient will be admitted to the [place] floor for further workup and observation.  Patient seen and discussed with Dr. Sabra Heck, ED attending.  Final Clinical Impressions(s) / ED Diagnoses   Final diagnoses:  Ataxia  Brain metastases ([place])  History of lung cancer  Multiple falls    New Prescriptions New Prescriptions   No  medications on file     Gibson Ramp, MD 07/14/16 [id number]    Noemi Chapel, MD 07/14/16 [phone]

## 2016-07-13 NOTE — ED Triage Notes (Signed)
Per EMS: Wife of pt called EMS for acting different than normal. Pt has had increase in falls since 1930. 3 falls in total. Pt has had inablility to recall information, an abnormal gait, no unilateral deficits, and has right sided droop at baseline according to wife. Pt has a history of stage 4 lung cancer that he is currently on immunotherpy for. Pt received a cortisone injection yesterday and has been c/o right sided neck pain for 2 days.   Pt is A&Ox4. Able to follow commands. Still has an inability to recall.

## 2016-07-13 NOTE — ED Notes (Signed)
CANCELLED Isabella

## 2016-07-14 ENCOUNTER — Encounter (HOSPITAL_COMMUNITY): Payer: Self-pay | Admitting: *Deleted

## 2016-07-14 ENCOUNTER — Inpatient Hospital Stay (HOSPITAL_COMMUNITY): Payer: Managed Care, Other (non HMO)

## 2016-07-14 DIAGNOSIS — C787 Secondary malignant neoplasm of liver and intrahepatic bile duct: Secondary | ICD-10-CM | POA: Diagnosis present

## 2016-07-14 DIAGNOSIS — W109XXA Fall (on) (from) unspecified stairs and steps, initial encounter: Secondary | ICD-10-CM | POA: Diagnosis present

## 2016-07-14 DIAGNOSIS — Z801 Family history of malignant neoplasm of trachea, bronchus and lung: Secondary | ICD-10-CM | POA: Diagnosis not present

## 2016-07-14 DIAGNOSIS — R270 Ataxia, unspecified: Secondary | ICD-10-CM | POA: Diagnosis present

## 2016-07-14 DIAGNOSIS — R296 Repeated falls: Secondary | ICD-10-CM | POA: Diagnosis present

## 2016-07-14 DIAGNOSIS — C7931 Secondary malignant neoplasm of brain: Secondary | ICD-10-CM

## 2016-07-14 DIAGNOSIS — Z79890 Hormone replacement therapy (postmenopausal): Secondary | ICD-10-CM | POA: Diagnosis not present

## 2016-07-14 DIAGNOSIS — G893 Neoplasm related pain (acute) (chronic): Secondary | ICD-10-CM | POA: Diagnosis present

## 2016-07-14 DIAGNOSIS — Z803 Family history of malignant neoplasm of breast: Secondary | ICD-10-CM | POA: Diagnosis not present

## 2016-07-14 DIAGNOSIS — G936 Cerebral edema: Secondary | ICD-10-CM | POA: Diagnosis present

## 2016-07-14 DIAGNOSIS — Z923 Personal history of irradiation: Secondary | ICD-10-CM | POA: Diagnosis not present

## 2016-07-14 DIAGNOSIS — Z7901 Long term (current) use of anticoagulants: Secondary | ICD-10-CM | POA: Diagnosis not present

## 2016-07-14 DIAGNOSIS — Z7952 Long term (current) use of systemic steroids: Secondary | ICD-10-CM | POA: Diagnosis not present

## 2016-07-14 DIAGNOSIS — R413 Other amnesia: Secondary | ICD-10-CM | POA: Diagnosis present

## 2016-07-14 DIAGNOSIS — Z79899 Other long term (current) drug therapy: Secondary | ICD-10-CM | POA: Diagnosis not present

## 2016-07-14 DIAGNOSIS — E2740 Unspecified adrenocortical insufficiency: Secondary | ICD-10-CM | POA: Diagnosis present

## 2016-07-14 DIAGNOSIS — F17290 Nicotine dependence, other tobacco product, uncomplicated: Secondary | ICD-10-CM | POA: Diagnosis present

## 2016-07-14 DIAGNOSIS — I81 Portal vein thrombosis: Secondary | ICD-10-CM | POA: Diagnosis not present

## 2016-07-14 DIAGNOSIS — Z66 Do not resuscitate: Secondary | ICD-10-CM | POA: Diagnosis present

## 2016-07-14 DIAGNOSIS — R260 Ataxic gait: Secondary | ICD-10-CM | POA: Diagnosis present

## 2016-07-14 DIAGNOSIS — D72829 Elevated white blood cell count, unspecified: Secondary | ICD-10-CM | POA: Diagnosis present

## 2016-07-14 DIAGNOSIS — C3491 Malignant neoplasm of unspecified part of right bronchus or lung: Secondary | ICD-10-CM | POA: Diagnosis not present

## 2016-07-14 DIAGNOSIS — I10 Essential (primary) hypertension: Secondary | ICD-10-CM | POA: Diagnosis present

## 2016-07-14 DIAGNOSIS — I619 Nontraumatic intracerebral hemorrhage, unspecified: Secondary | ICD-10-CM | POA: Insufficient documentation

## 2016-07-14 DIAGNOSIS — C7951 Secondary malignant neoplasm of bone: Secondary | ICD-10-CM | POA: Diagnosis present

## 2016-07-14 DIAGNOSIS — C3490 Malignant neoplasm of unspecified part of unspecified bronchus or lung: Secondary | ICD-10-CM | POA: Diagnosis present

## 2016-07-14 HISTORY — DX: Secondary malignant neoplasm of brain: C79.31

## 2016-07-14 MED ORDER — ROSUVASTATIN CALCIUM 10 MG PO TABS
5.0000 mg | ORAL_TABLET | Freq: Every day | ORAL | Status: DC
  Administered 2016-07-14: 5 mg via ORAL
  Filled 2016-07-14: qty 1

## 2016-07-14 MED ORDER — CITALOPRAM HYDROBROMIDE 20 MG PO TABS
30.0000 mg | ORAL_TABLET | Freq: Every day | ORAL | Status: DC
  Administered 2016-07-14: 30 mg via ORAL
  Filled 2016-07-14: qty 2
  Filled 2016-07-14: qty 3

## 2016-07-14 MED ORDER — HYDROCODONE-ACETAMINOPHEN 5-325 MG PO TABS
1.0000 | ORAL_TABLET | ORAL | Status: DC | PRN
Start: 2016-07-14 — End: 2016-07-14

## 2016-07-14 MED ORDER — DEXAMETHASONE 2 MG PO TABS: 4.0000 mg | ORAL_TABLET | Freq: Two times a day (BID) | ORAL | 0 refills | Status: DC

## 2016-07-14 MED ORDER — HYDROCORTISONE 20 MG PO TABS
20.0000 mg | ORAL_TABLET | Freq: Every morning | ORAL | Status: DC
  Administered 2016-07-14: 20 mg via ORAL
  Filled 2016-07-14: qty 1

## 2016-07-14 MED ORDER — POTASSIUM CHLORIDE CRYS ER 20 MEQ PO TBCR
40.0000 meq | EXTENDED_RELEASE_TABLET | Freq: Once | ORAL | Status: AC
  Administered 2016-07-14: 40 meq via ORAL
  Filled 2016-07-14: qty 2

## 2016-07-14 MED ORDER — SODIUM CHLORIDE 0.9 % IV SOLN
INTRAVENOUS | Status: AC
  Administered 2016-07-14: 03:00:00 via INTRAVENOUS

## 2016-07-14 MED ORDER — ACETAMINOPHEN 650 MG RE SUPP: 650.0000 mg | Freq: Four times a day (QID) | RECTAL | Status: DC | PRN

## 2016-07-14 MED ORDER — GADOBENATE DIMEGLUMINE 529 MG/ML IV SOLN
15.0000 mL | Freq: Once | INTRAVENOUS | Status: AC | PRN
  Administered 2016-07-14: 15 mL via INTRAVENOUS

## 2016-07-14 MED ORDER — ONDANSETRON HCL 4 MG PO TABS: 4.0000 mg | ORAL_TABLET | Freq: Four times a day (QID) | ORAL | Status: DC | PRN

## 2016-07-14 MED ORDER — ONDANSETRON HCL 4 MG/2ML IJ SOLN: 4.0000 mg | Freq: Four times a day (QID) | INTRAMUSCULAR | Status: DC | PRN

## 2016-07-14 MED ORDER — ACETAMINOPHEN 325 MG PO TABS: 650.0000 mg | ORAL_TABLET | Freq: Four times a day (QID) | ORAL | Status: DC | PRN

## 2016-07-14 MED ORDER — POLYETHYLENE GLYCOL 3350 17 G PO PACK: 17.0000 g | PACK | Freq: Every day | ORAL | Status: DC | PRN

## 2016-07-14 MED ORDER — LORAZEPAM 1 MG PO TABS: 1.0000 mg | ORAL_TABLET | Freq: Three times a day (TID) | ORAL | Status: DC | PRN

## 2016-07-14 NOTE — Progress Notes (Signed)
Neurology Consultation Follow UP Note Reason for Consult: Altered mental status Referring Physician: Sabra Heck, B  CC: Altered mental status  History is obtained from: Patient, family  HPI: Jeffery Cobb is a 60 y.o. male with a history of brain mets from lung cancer, Not currently undergoing treatment. He was in his normal state of health tonight until around 6 PM at which time, his wife for him fall down stairs. He was not confused prior to this, he was not having any difficulty with his gait. it is unclear what caused him to fall. After the fall, he has been having difficulty with his short-term memory and has been having a slightly unsteady gait.   Interval History: -PT notes he feels close to his baseline, he has not had any seizure-like activity and did not endorse any seizure like activity with this episode. He is not having any new neurological symptoms. He sees [facility]. Leonel Ramsay at [facility].  -MRI showing areas of hemosiderin deposition in areas of treated lesions, this was also seen on a scan 05/10/13  Exam: Current vital signs: BP 112/64 (BP Location: Right Arm)   Pulse 73   Temp 98.1 F (36.7 C) (Oral)   Resp 16   Ht 5' 1.2" (1.554 m)   Wt 87.4 kg (192 lb 9.6 oz)   SpO2 96%   BMI 36.15 kg/m  Vital signs in last 24 hours: Temp:  [97.9 F (36.6 C)-98.9 F (37.2 C)] 98.1 F (36.7 C) (11/25 0404) Pulse Rate:  [64-77] 73 (11/25 0404) Resp:  [13-20] 16 (11/25 0404) BP: (96-122)/(63-73) 112/64 (11/25 0404) SpO2:  [92 %-98 %] 96 % (11/25 0404) Weight:  [87.4 kg (192 lb 9.6 oz)-93.5 kg (206 lb 2.1 oz)] 87.4 kg (192 lb 9.6 oz) (11/25 0144)   Physical Exam  Constitutional: Appears well-developed and well-nourished.  Psych: Affect appropriate to situation Eyes: No scleral injection HENT: No OP obstrucion Head: Normocephalic.  Cardiovascular: Normal rate and regular rhythm.  Respiratory: Effort normal and breath sounds normal to anterior ascultation GI: Soft.  No  distension. There is no tenderness.  Skin: WDI  Neuro: Mental Status: Patient is awake, alert, oriented to person, place, month, year, and situation. No signs of aphasia or neglect He has significant short temr memory impairment, unable to remember getting CT scan 15 minutes after it occurred.  Cranial Nerves: II: Visual Fields are full. Pupils are equal, round, and reactive to light.   III,IV, VI: EOMI without ptosis or diploplia.  V: Facial sensation is symmetric to temperature VII: Facial movement is mild left facial weakness.  VIII: hearing is intact to voice X: Uvula elevates symmetrically XI: Shoulder shrug is symmetric. XII: tongue is midline without atrophy or fasciculations.  Motor: Tone is normal. Bulk is normal. 5/5 throughout  With exception of 4+ of L hip flexion.  Sensory: Sensation is symmetric to light touch and temperature in the arms and legs. Cerebellar: FNF  intact on right, consistent with weakness on left.   I have reviewed labs in epic and the results pertinent to this consultation are: No new labs for review   I have reviewed the images obtained:   MRI Brain w/wout 07/14/16: Multiple areas of metastatic disease with associated cerebral edema, hemosiderin deposition in these lesions noted on SWI.   Impression: 60 yo M with history of treated metastatic cancer followed by radiation oncology at [facility]. Reviewed multiple care everywhere reports from his previous MRIs, a report from 05/10/13 noted hemosiderin deposition in previously treated  areas, however, unable to personally review these images. It is likely that these changes seen on CT scan and MRI are consistent with known treated metastatic disease.   Discussed risk and benefits of remaining off of anticoagulation until he can follow up with his radiation oncologist vs. Re-starting. PT has decided to remain on anticoagulation given the hemosiderin likely represents chronic deposition.   Given the edema and  worsening symptoms we will start on a decadron taper, pt can taper off early by radiation oncologist next week if he so chooses.   Stroke 9-1-1 reviewed including when to call 9-1-1 including worse headache of life, sudden onset slurred speech, aphasia, dysarthria, hemi-body weakness or sensory deficits.   Recommendations: -PT to follow up with radiation oncology at [facility], next appt is next week -Decadron Taper:   -'4MG'$  BID x 5D then  -'2MG'$  BID x 5D then  -2MD Daily thereafter  Neurology to sign off at this time.   Loney Hering, D.O.  [facility] [phone]  If 7pm- 7am, please page neurology on call as listed in [place].

## 2016-07-14 NOTE — ED Notes (Signed)
Jeffery Cobb (wife of pt) #: [phone]

## 2016-07-14 NOTE — Discharge Instructions (Signed)
Take decadron 4 mg twice daily for 5 days then 2 mg twice daily for 5 days then 1 tablet daily for 5 days then return to taking hydrocortisone

## 2016-07-14 NOTE — Progress Notes (Signed)
\  PT Cancellation Note  Patient Details Name: Berl Bonfanti MRN: [id number] DOB: [birth year]   Cancelled Treatment:    Reason Eval/Treat Not Completed: Other (comment) (Pt discharging) Per RN pt is getting ready to discharge home. Unable to complete evaluation   Scheryl Marten PT, DPT  [phone]  07/14/2016, 2:38 PM

## 2016-07-14 NOTE — Discharge Summary (Signed)
Physician Discharge Summary  Jeffery Cobb OIN:[id number] DOB: May 19, 1956 DOA: 07/13/2016  PCP: No primary care provider on file.  Admit date: 07/13/2016 Discharge date: 07/14/2016  Admitted From: home Disposition:  home  Recommendations for Outpatient Follow-up:  1. Follow up with [facility] primary team next week as scheduled 2. Patient placed on decadron taper  Home Health: none Equipment/Devices: none  Discharge Condition: stable CODE STATUS: DNR Diet recommendation: regular  HPI: Per Dr. Roel Cluck,  Jeffery Cobb is a 60 y.o. 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 He had a total of 3 falls he is unable to recollect what happened has been having abnormal walking wife states he has fallen down the stairs but then may have had some subsequent falls as well. Prior to falling down the stairs he was not confused. It seems that all the difficulties and confusion have started after the initial fall been having trouble with short-term memory Patient has right-sided droop but that's unchanged from baseline secondary to history of stage IV lung cancer metastatic spread to the brain. Patient is currently on immunotherapy only followed at [facility] patient has chronic hip pain secondary to metastatic spread and has been getting anesthetic as well as steroid injections last time was done yesterday. Initially brought in as a code stroke which was canceled. Plan of care has been discussed with patient and his wife his wife wishes for patient to be currently admitted to the hospital for monitoring. Patient and his wife postdated concentration care should be on comfort and palliation they would not be interested in aggressive interventions that they're concerned  the patient is unstable on his feet. Family and patient both confirm that they wish him to be DO NOT RESUSCITATE DO NOT INTUBATE and concentrate on comfort measures Regarding pertinent Chronic problems: diagnosed with stage IV adenocarcinoma of the lung 12/2012, right lung primary. He has metastatic disease to the inguinal lymph nodes, liver, bilateral adrenal glands, brain and right femur. Last treatment 08/2014 with surveillance CTs without evidence of relapse. CT scan 04/11/2016 with no evidence of cancer progression, but new finding of nonocclusive main portal vein thrombus. He was begun on therapeutic Lovenox   Hospital Course: Discharge Diagnoses:  Active Problems:   Metastatic lung cancer (metastasis from lung to other site) [facility])   Brain metastasis ([place])   Portal vein thrombosis   Short-term memory loss   Brain metastases ([place])  Fall - Patient was admitted to the hospital with fall in the setting of losing his balance. He denied any syncopal episodes or loss of consciousness. When he was evaluated in the emergency room, he underwent a CT scan of the brain which showed concern for small foci of hemorrhage associated with brain metastasis. Neurology was consulted and have evaluated patient. They recommended an MRI, which showed scattered enhancing cerebral metastatic disease with edema, without any significant mass effect, and cerebral lesions are associated with hemosiderin, no mention of acute hemorrhage. I discussed findings with on-call neurologist Dr. Golden Hurter, will place patient on Decadron to help with edema, and he sees no significant contraindications to resuming anticoagulation. Patient has close outpatient follow-up next week at [facility] with his oncologist. Currently patient is not getting any treatment for his stage IV metastatic disease, and his main focus for his care  is comfort, discussed with patient and offered palliative care consultation or hospice set up at home however he declined   and chooses to be discharged home to be with his family. He returned to baseline, wishes to be discharged home. He'll be sent home in guarded condition given his advanced disease and very poor prognosis. Portal vein thrombosis - resume Lovenox  Stage IV metastatic lung disease - comfort care  Adrenal insufficiency - hold hydrocortisone while he is on Decadron, when he finishes a taper patient was instructed to go back on his hydrocortisone    Discharge Instructions     Medication List    STOP taking these medications   hydrocortisone 5 MG tablet Commonly known as:  CORTEF     TAKE these medications   ANDROGEL 20.25 MG/1.25GM (1.62%) Gel Generic drug:  Testosterone Apply 2 packets topically See admin instructions. To the shoulders and or upper arms once a day   citalopram 20 MG tablet Commonly known as:  CELEXA Take 30 mg by mouth daily.   dexamethasone 2 MG tablet Commonly known as:  DECADRON Take 2 tablets (4 mg total) by mouth 2 (two) times daily with a meal. 4 mg twice daily for 5 days then 2 mg twice daily for 5 days then 1 tablet daily for 5 days   enoxaparin 80 MG/0.8ML injection Commonly known as:  LOVENOX Inject 0.8 mLs into the skin every 12 (twelve) hours.   LORazepam 1 MG tablet Commonly known as:  ATIVAN Take 1 mg by mouth every 8 (eight) hours as needed for anxiety.   rosuvastatin 5 MG tablet Commonly known as:  CRESTOR Take 5 mg by mouth daily.   testosterone cypionate 200 MG/ML injection Commonly known as:  DEPOTESTOSTERONE CYPIONATE Inject 1 mL into the muscle See admin instructions. Every 10 days      Follow-up Information    [facility] / [facility]rology. Schedule an appointment as soon as possible for a visit in 1 week(s).          No Known Allergies  Consultations:  Neurology   Procedures/Studies:  Mr Jeri Cos Wo Contrast  Result Date: 07/14/2016 CLINICAL DATA:  60 year old male with stage IV lung cancer. Brain and skeletal metastases.  Confusion and memory difficulty. Abnormal noncontrast head CT suggesting metastatic related cerebral edema. Subsequent encounter. EXAM: MRI HEAD WITHOUT AND WITH CONTRAST TECHNIQUE: Multiplanar, multiecho pulse sequences of the brain and surrounding structures were obtained without and with intravenous contrast. CONTRAST:  69m MULTIHANCE GADOBENATE DIMEGLUMINE 529 MG/ML IV SOLN COMPARISON:  Head CT without contrast 07/13/2016. FINDINGS: Brain: No restricted diffusion or evidence of acute infarction. No ventriculomegaly. No intracranial mass effect. Basilar cisterns remain patent. Negative pituitary and cervicomedullary junction. Scattered small enhancing brain masses ranging from punctate (posterior left frontal lobe series 13, image 37) to 18 mm (right superior frontal gyrus series 14, image 17). Each of the metastatic lesions also demonstrate significant hemosiderin deposition on susceptibility weighted imaging. Associated regional T2 and FLAIR hyperintensity which most resembles vasogenic edema. Mild regional mass effect in the edematous areas. Seven discrete cerebral lesions are identified, although that suspected in the medial anterior left frontal lobe on series 8, image 60 has neither enhancement or surrounding edema. There is also a single 4-5 mm enhancing right superior cerebellar metastasis (series 13, image 17) which is not associated with surrounding edema or convincing blood products. No leptomeningeal or pachymeningeal involvement identified. No extra-axial or intraventricular set no evidence of extra-axial or intraventricular hemorrhage. Vascular: Major intracranial  vascular flow voids are preserved. Skull and upper cervical spine: Mildly heterogeneous calvarium bone marrow signal but no destructive osseous lesion identified. Negative visualized cervical spinal cord. Bone marrow signal in the cervical spine appears normal. Sinuses/Orbits: Normal orbits soft tissues. Visualized paranasal sinuses and  mastoids are stable and well pneumatized. Other: Visible internal auditory structures appear normal. Negative scalp soft tissues. IMPRESSION: 1. Scattered enhancing cerebral metastatic disease associated with mild regional edema but no significant intracranial mass effect. Each cerebral lesion has associated hemosiderin, which may indicate these are treated metastases (e.g. with radiosurgery). One such lesion in the medial anterior left frontal lobe (series 8, image 60) has no associated enhancement or edema. There are 6 enhancing and edematous supratentorial metastases, ranging from punctate to 18 mm. 2. Superimposed small enhancing 4 mm right superior cerebellar metastasis without associated hemosiderin or edema. 3. No other acute intracranial abnormality. No acute infarct or extra-axial hemorrhage. Electronically Signed   By: Genevie Ann M.D.   On: 07/14/2016 12:00   Ct Head Code Stroke W/o Cm  Result Date: 07/13/2016 CLINICAL DATA:  Code stroke.  Confusion and memory difficulty EXAM: CT HEAD WITHOUT CONTRAST TECHNIQUE: Contiguous axial images were obtained from the base of the skull through the vertex without intravenous contrast. COMPARISON:  None. FINDINGS: Brain: There is focal hypoattenuation within the superior right frontal lobe, both frontal poles and the left frontal lobe white matter, and vasogenic edema pattern compatible with the patient's reported brain metastases. There is a small focus of hyperdensity in the left frontal pole (axial image 25), right frontal pole (axial image 21 close for and at the right vertex (axial image 28) superimposed on these areas of vasogenic edema. No midline shift or mass effect. No hydrocephalus. No extra-axial collection. Vascular: Mild atherosclerotic calcification of the vertebral and internal carotid arteries at the skullbase. Skull: No skull fracture or focal calvarial lesion. Visualized skull base is normal. Sinuses/Orbits: The visualized portions of the  paranasal sinuses and mastoid air cells are free of fluid. No advanced mucosal thickening. The visualized orbits are normal. Other: None ASPECTS ([place] Stroke Program Early CT Score) - Ganglionic level infarction (caudate, lentiform nuclei, internal capsule, insula, M1-M3 cortex): 7 - Supraganglionic infarction (M4-M6 cortex): 3 Total score (0-10 with 10 being normal): 10 IMPRESSION: 1. Multifocal invasion edema within both frontal lobes, consistent with reported brain metastases. 2. Small foci of hyperdensity at both frontal poles and at the right vertex are most consistent with small foci of hemorrhage associated with brain metastases. 3. ASPECTS is 10.  No CT evidence of acute cortical infarct. These results were called by telephone at the time of interpretation on 07/13/2016 at 9:30 pm to Dr. Roland Rack, who verbally acknowledged these results. Electronically Signed   By: Ulyses Jarred M.D.   On: 07/13/2016 21:37      Subjective: - no chest pain, shortness of breath, no abdominal pain, nausea or vomiting.   Discharge Exam: Vitals:   07/14/16 0147 07/14/16 0404  BP: 122/71 112/64  Pulse: 64 73  Resp: 16 16  Temp: 98.6 F (37 C) 98.1 F (36.7 C)   Vitals:   07/14/16 0015 07/14/16 0144 07/14/16 0147 07/14/16 0404  BP: 107/69  122/71 112/64  Pulse: 77  64 73  Resp: '17  16 16  '$ Temp:   98.6 F (37 C) 98.1 F (36.7 C)  TempSrc:    Oral  SpO2: 94%  98% 96%  Weight:  87.4 kg (192 lb 9.6 oz)  Height:  5' 1.2" (1.554 m)     General: Pt is alert, awake, not in acute distress Cardiovascular: RRR, S1/S2 +, no rubs, no gallops Respiratory: CTA bilaterally, no wheezing, no rhonchi Abdominal: Soft, NT, ND, bowel sounds +   The results of significant diagnostics from this hospitalization (including imaging, microbiology, ancillary and laboratory) are listed below for reference.     Microbiology: No results found for this or any previous visit (from the past 240 hour(s)).    Labs: BNP (last 3 results) No results for input(s): BNP in the last 8760 hours. Basic Metabolic Panel:  Recent Labs Lab 07/13/16 2109 07/13/16 2139  NA 135 136  K 3.2* 4.3  CL 102 102  CO2 20*  --   GLUCOSE 91 83  BUN 27* 44*  CREATININE 1.54* 1.60*  CALCIUM 8.9  --    Liver Function Tests:  Recent Labs Lab 07/13/16 2109  AST 28  ALT 25  ALKPHOS 47  BILITOT 0.2*  PROT 7.2  ALBUMIN 4.2   No results for input(s): LIPASE, AMYLASE in the last 168 hours. No results for input(s): AMMONIA in the last 168 hours. CBC:  Recent Labs Lab 07/13/16 2109 07/13/16 2139  WBC 12.0*  --   NEUTROABS 9.1*  --   HGB 16.4 16.7  HCT 46.0 49.0  MCV 90.9  --   PLT 229  --    Cardiac Enzymes: No results for input(s): CKTOTAL, CKMB, CKMBINDEX, TROPONINI in the last 168 hours. BNP: Invalid input(s): POCBNP CBG:  Recent Labs Lab 07/13/16 2111  GLUCAP 96    Time coordinating discharge: > 30 minutes  SIGNED:  Marzetta Board, MD  [facility] 07/14/2016, 3:20 PM Pager [phone]  If 7PM-7AM, please contact night-coverage [email or web address] Password [email or web address]

## 2016-07-14 NOTE — Progress Notes (Signed)
Jeffery Cobb to be D/C'd Home per MD order.  Discussed with the patient and all questions fully answered.  VSS, Skin clean, dry and intact without evidence of skin break down, no evidence of skin tears noted. IV catheter discontinued intact. Site without signs and symptoms of complications. Dressing and pressure applied.  An After Visit Summary was printed and given to the patient. Patient received prescription.  D/c education completed with patient/family including follow up instructions, medication list, d/c activities limitations if indicated, with other d/c instructions as indicated by MD - patient able to verbalize understanding, all questions fully answered.   Patient instructed to return to ED, call 911, or call MD for any changes in condition.   Patient escorted via [place], and D/C home via private auto.  [name] 07/14/2016 2:25 PM

## 2016-07-14 NOTE — ED Notes (Signed)
Called report. Lexine Baton, RN is waiting to talk to the MD to determine if pt is appropriate for the floor. RN is concerned with hemorrhage around pt's METs and is concerned about tele possibility.

## 2017-01-07 DIAGNOSIS — C3490 Malignant neoplasm of unspecified part of unspecified bronchus or lung: Secondary | ICD-10-CM | POA: Insufficient documentation

## 2017-03-21 ENCOUNTER — Encounter: Payer: Self-pay | Admitting: Physical Therapy

## 2017-03-21 ENCOUNTER — Ambulatory Visit: Payer: Managed Care, Other (non HMO) | Attending: Orthopedic Surgery | Admitting: Physical Therapy

## 2017-03-21 DIAGNOSIS — R2689 Other abnormalities of gait and mobility: Secondary | ICD-10-CM | POA: Insufficient documentation

## 2017-03-21 DIAGNOSIS — M6281 Muscle weakness (generalized): Secondary | ICD-10-CM | POA: Insufficient documentation

## 2017-03-21 NOTE — Patient Instructions (Addendum)
Curl-Up: Phase 1    With arms at sides, tilt pelvis to flatten back. Raise head and shoulders from floor. Use arms to support trunk if necessary. Repeat _15-30___ times per set. Do _1___ sets per session. Do _1___ sessions per day.  http://orth.exer.us/136   Copyright  VHI. All rights reserved.   Diagonal Curl-Up: Phase 1    With arms at sides, tilt pelvis to flatten back. Raise head and shoulders, rotating to left side as shoulder blades clear floor. Repeat __15__ times per set. Do __1__ sets per session. Do ___1_ sessions per day.  http://orth.exer.us/138   Copyright  VHI. All rights reserved.  ABDUCTION: Standing (Active)    Stand, feet flat. Lift right leg out to side. Use _0__ lbs. Complete _10__ sets of __3_ repetitions. Perform _1__ sessions per day. Do not tilt your trunk.  http://gtsc.exer.us/110   Copyright  VHI. All rights reserved.  Back Leg Kick    Swing leg back as far as possible. Return to center. Repeat with other leg. Repeat _10___ times. 3 sets.  Do __1__ sessions per day. Do not tilt body forward. Go slowly and keep knee straight.  http://gt2.exer.us/483   Copyright  VHI. All rights reserved.  [facility] [address], [place], [place] [address] Phone # [phone] Fax [phone]

## 2017-03-21 NOTE — Therapy (Signed)
[facility] [address], [place], [place], [address] Phone: [phone]   Fax:  [phone]  Physical Therapy Evaluation  Patient Details  Name: Jeffery Cobb MRN: [id number] Date of Birth: May 17, 1956 Referring Provider: Dr. Lockie Mola  Encounter Date: 03/21/2017      PT End of Session - 03/21/17 1444    Visit Number 1   Date for PT Re-Evaluation 05/16/17   Authorization Type [employer]   PT Start Time 1350   PT Stop Time 1440   PT Time Calculation (min) 50 min   Activity Tolerance Patient tolerated treatment well   Behavior During Therapy [facility] for tasks assessed/performed      Past Medical History:  Diagnosis Date  . Cancer ([place])    lung    Past Surgical History:  Procedure Laterality Date  . JOINT REPLACEMENT Left 6.20/2018   hip    There were no vitals filed for this visit.       Subjective Assessment - 03/21/17 1404    Subjective Patient had left hip replacement on 02/06/2017,  Patient was in the hospital for 3 days. Patient went home to a HEP that he followed. I saw the MD last Tuesday and wanted me to start therapy.  Patient had a posterior hip replacement.    Patient Stated Goals strengthen the left hip and improve walking   Currently in Pain? No/denies            [facility] PT Assessment - 03/21/17 0001      Assessment   Medical Diagnosis posterior total hip arthroplasty   Referring Provider Dr. Mayford Knife Eward   Onset Date/Surgical Date 02/06/17   Prior Therapy yes     Precautions   Precautions Posterior Hip   Precaution Comments cancer precautions; Posterior hip precautions     Restrictions   Weight Bearing Restrictions No     Balance Screen   Has the patient fallen in the past 6 months No   Has the patient had a decrease in activity level because of a fear of falling?  No   Is the patient reluctant to leave their home because of a fear of falling?  No     Home  Environment   Living Environment Private residence   Type of [place] Access Stairs to enter   Entrance Stairs-Number of Steps 4   Home Layout Two level     Prior Function   Level of Independence Independent     Cognition   Overall Cognitive Status Within Functional Limits for tasks assessed     Posture/Postural Control   Posture/Postural Control No significant limitations     ROM / Strength   AROM / PROM / Strength AROM;PROM;Strength     AROM   Left Hip ABduction 0     PROM   Left Hip Flexion 90   Left Hip External Rotation  20   Left Hip ABduction 20     Strength   Left Hip Flexion 4/5   Left Hip Extension 4/5   Left Hip ABduction 3-/5   Left Knee Flexion 5/5   Left Knee Extension 4/5     Ambulation/Gait   Ambulation/Gait Yes   Gait Pattern Decreased weight shift to left;Trendelenburg  left leg will adduct; right knee will buckle   Stairs Yes   Stair Management Technique Alternating pattern;One rail Left  and holds onto the wall  Objective measurements completed on examination: See above findings.                  PT Education - 03/21/17 1438    Education provided Yes   Education Details abdominal exercise; hip strength   Person(s) Educated Patient   Methods Explanation;Demonstration;Verbal cues;Handout   Comprehension Returned demonstration;Verbalized understanding          PT Short Term Goals - 03/21/17 1452      PT SHORT TERM GOAL #1   Title independent with intial HEP   Time 4   Period Weeks   Status New   Target Date 04/18/17     PT SHORT TERM GOAL #2   Title able to recite left total hip precautions without verbal cues   Time 4   Period Weeks   Status New   Target Date 04/18/17     PT SHORT TERM GOAL #3   Title understand how to use a cane in right hand due to left knee buckling with walking   Time 4   Period Weeks   Status New   Target Date 04/18/17           PT Long Term Goals -  03/21/17 1453      PT LONG TERM GOAL #1   Title independent with HEP and how to progress   Time 8   Period Weeks   Status New   Target Date 05/16/17     PT LONG TERM GOAL #2   Title understand how to exercise at the gym safely  while protecting the left hip    Time 8   Period Weeks   Status New   Target Date 04/18/17     PT LONG TERM GOAL #3   Title walk with no left hip adduction due to left hip strength >/= 4/5   Time 8   Period Weeks   Status New   Target Date 04/18/17     PT LONG TERM GOAL #4   Title ability to walk in the store for 30-45 minutes due to increased endurance and left hip strength without a cane   Time 8   Period Weeks   Status New   Target Date 04/18/17                Plan - 03/21/17 1445    Clinical Impression Statement Patient is a 61 year old male s/p left posterior hip replacement on 02/06/2017.  Patient was in the hospital for 3 days then did his HEP for several weeks.  Patient is using a cane at home and just stopped using his walker.  Patient ambulates with left hip slighlty adducted, left knee will buckle at times, and decreased weightbear on the left.  Left hip strength is flexion 4/5, abduction 3-/5, extension 4/5.  Left knee extension is 4/5. Surgical site is healed with decreased mobility.  Pateint needed verbal cues to understand his hip precautions.  Patient reports no pain.  Patient will benefit from skilled therapy to improve left hip and knee strength to improve gait and decrease strain in left hip.    History and Personal Factors relevant to plan of care: s/p left posterior hip replacement 02/06/2017; metastatic lung cancer to the bone; pins in right hip   Clinical Presentation Stable   Clinical Presentation due to: stable condition   Clinical Decision Making Low   Rehab Potential Good   Clinical Impairments Affecting Rehab Potential s/p left total replacement posteriorly with  precautions of no crossing leg, no IR, and no hip flexion  past 90 degrees   PT Frequency 1x / week   PT Duration 8 weeks   PT Treatment/Interventions Therapeutic activities;Therapeutic exercise;Neuromuscular re-education;Patient/family education;Scar mobilization;Manual techniques;Energy conservation;Gait training   PT Next Visit Plan scar tissue massage and education; left hip and knee strength; instruct on how to use muscle stimulator for left knee buckling   PT Home Exercise Plan review total hip precautions   Consulted and Agree with Plan of Care Patient      Patient will benefit from skilled therapeutic intervention in order to improve the following deficits and impairments:  Decreased mobility, Decreased strength, Difficulty walking, Decreased range of motion, Decreased endurance  Visit Diagnosis: Muscle weakness (generalized) - Plan: PT plan of care cert/re-cert  Other abnormalities of gait and mobility - Plan: PT plan of care cert/re-cert     Problem List Patient Active Problem List   Diagnosis Date Noted  . ICH (intracerebral hemorrhage) ([place]) 07/14/2016  . Portal vein thrombosis 07/14/2016  . Short-term memory loss 07/14/2016  . Brain metastases ([place]) 07/14/2016  . Metastatic lung cancer (metastasis from lung to other site) ([place]) 07/13/2016  . Brain metastasis ([place]) 07/13/2016    Earlie Counts, PT 03/21/17 2:58 PM   [facility] [address] [place], [place], [address] Phone: [phone]   Fax:  [phone]  Name: Zarius Furr MRN: [id number] Date of Birth: 04/19/1956

## 2017-03-27 ENCOUNTER — Ambulatory Visit: Payer: Managed Care, Other (non HMO) | Admitting: Physical Therapy

## 2017-03-27 ENCOUNTER — Encounter: Payer: Self-pay | Admitting: Physical Therapy

## 2017-03-27 DIAGNOSIS — M6281 Muscle weakness (generalized): Secondary | ICD-10-CM | POA: Diagnosis not present

## 2017-03-27 DIAGNOSIS — R2689 Other abnormalities of gait and mobility: Secondary | ICD-10-CM

## 2017-03-27 NOTE — Patient Instructions (Signed)
Clam Shell 45 Degrees    Lying with hips and knees bent 45, one pillow between knees and ankles. Lift knee. Be sure pelvis does not roll backward. Do not arch back. Do _30__ times, each leg, _1__ times per day.  http://ss.exer.us/74   Copyright  VHI. All rights reserved.  KNEE: Extension, Long Arc Quads - Sitting    Raise leg until knee is straight. _10__ reps per set, 3___ sets per day, _1__ days per week 1#  On ankle Copyright  VHI. All rights reserved.  Hamstring Strengthening    Holding support, lift right heel toward buttocks. Use __3__ lbs on ankle. Hold __1__ seconds. Repeat __30__ times. Do __1__ sessions per day.  http://gt2.exer.us/281   Copyright  VHI. All rights reserved.  [facility] [address], [place], [place] [address] Phone # [phone] Fax [phone]

## 2017-03-27 NOTE — Therapy (Signed)
[facility] [address], [place], [place], [address] Phone: [phone]   Fax:  [phone]  Physical Therapy Treatment  Patient Details  Name: Jeffery Cobb MRN: [id number] Date of Birth: 1956/06/07 Referring Provider: Dr. Lockie Mola  Encounter Date: 03/27/2017      PT End of Session - 03/27/17 0923    Visit Number 2   Date for PT Re-Evaluation 05/16/17   Authorization Type [employer]   PT Start Time 0845   PT Stop Time 0925   PT Time Calculation (min) 40 min   Activity Tolerance Patient tolerated treatment well   Behavior During Therapy [facility] for tasks assessed/performed      Past Medical History:  Diagnosis Date  . Cancer ([place])    lung    Past Surgical History:  Procedure Laterality Date  . JOINT REPLACEMENT Left 6.20/2018   hip    There were no vitals filed for this visit.      Subjective Assessment - 03/27/17 0848    Subjective No changes since last visit. I have been using the Tens everyday and exercises. I have no pain since using the unit. My knee has not gave away.    Patient Stated Goals strengthen the left hip and improve walking   Currently in Pain? No/denies                         [facility] Adult PT Treatment/Exercise - 03/27/17 0001      Knee/Hip Exercises: Aerobic   Nustep level 2 ; 8 min; seat #13; no arms     Knee/Hip Exercises: Standing   Knee Flexion Strengthening;Left;3 sets;10 reps   Knee Flexion Limitations --  3# keeping hip in neutral   Rocker Board 1 minute   Rebounder 3 ways 1 min each   Other Standing Knee Exercises lean on high mat left one leg 10x 2  harder on the right   Other Standing Knee Exercises stand on flat side of BOSU ball and keep balance then 15 mini squats keeping pelvis leveled     Knee/Hip Exercises: Seated   Long Arc Quad Strengthening;Left;3 sets;10 reps;Weights   Long Arc Quad Weight 1 lbs.     Knee/Hip Exercises: Sidelying    Clams left no weight with assistance 3x10                PT Education - 03/27/17 0928    Education provided Yes   Education Details hip and knee strengthening   Person(s) Educated Patient   Methods Explanation;Demonstration;Verbal cues;Handout   Comprehension Returned demonstration;Verbalized understanding          PT Short Term Goals - 03/27/17 0930      PT SHORT TERM GOAL #1   Title independent with intial HEP   Time 4   Period Weeks   Status Achieved     PT SHORT TERM GOAL #2   Title able to recite left total hip precautions without verbal cues   Time 4   Period Weeks   Status On-going     PT SHORT TERM GOAL #3   Title understand how to use a cane in right hand due to left knee buckling with walking   Time 4   Period Weeks   Status On-going           PT Long Term Goals - 03/21/17 1453      PT LONG TERM GOAL #1   Title independent  with HEP and how to progress   Time 8   Period Weeks   Status New   Target Date 05/16/17     PT LONG TERM GOAL #2   Title understand how to exercise at the gym safely  while protecting the left hip    Time 8   Period Weeks   Status New   Target Date 04/18/17     PT LONG TERM GOAL #3   Title walk with no left hip adduction due to left hip strength >/= 4/5   Time 8   Period Weeks   Status New   Target Date 04/18/17     PT LONG TERM GOAL #4   Title ability to walk in the store for 30-45 minutes due to increased endurance and left hip strength without a cane   Time 8   Period Weeks   Status New   Target Date 04/18/17               Plan - 03/27/17 0240    Clinical Impression Statement Patient has difficulty with keeping his hips leveled during mini squats when on the BOSU ball.  Patient is able to fully extend his right knee with LAQ when using a 1#.  Patient needs assistance with clams on the left.  Patient has not met goals due to just starting therapy.  Patient will benefit from skilled therapy to  improve left hip and knee strength to improve gait and decrease strain in left hip.    Rehab Potential Good   Clinical Impairments Affecting Rehab Potential s/p left total replacement posteriorly with precautions of no crossing leg, no IR, and no hip flexion past 90 degrees   PT Frequency 1x / week   PT Duration 8 weeks   PT Treatment/Interventions Therapeutic activities;Therapeutic exercise;Neuromuscular re-education;Patient/family education;Scar mobilization;Manual techniques;Energy conservation;Gait training   PT Next Visit Plan scar tissue massage and education; left hip and knee strength;   PT Home Exercise Plan progress as needed   Recommended Other Services MD signed initial cert   Consulted and Agree with Plan of Care Patient      Patient will benefit from skilled therapeutic intervention in order to improve the following deficits and impairments:  Decreased mobility, Decreased strength, Difficulty walking, Decreased range of motion, Decreased endurance  Visit Diagnosis: Muscle weakness (generalized)  Other abnormalities of gait and mobility     Problem List Patient Active Problem List   Diagnosis Date Noted  . ICH (intracerebral hemorrhage) ([place]) 07/14/2016  . Portal vein thrombosis 07/14/2016  . Short-term memory loss 07/14/2016  . Brain metastases ([place]) 07/14/2016  . Metastatic lung cancer (metastasis from lung to other site) ([place]) 07/13/2016  . Brain metastasis ([place]) 07/13/2016    Earlie Counts, PT 03/27/17 9:31 AM   [facility] [address] [place], [place], [address] Phone: [phone]   Fax:  [phone]  Name: Axzel Rockhill MRN: [id number] Date of Birth: 1956/08/10

## 2017-03-29 ENCOUNTER — Inpatient Hospital Stay (HOSPITAL_COMMUNITY)
Admission: EM | Admit: 2017-03-29 | Discharge: 2017-04-01 | DRG: 372 | Disposition: A | Discharge status: Home / Self Care | Payer: Managed Care, Other (non HMO) | Attending: Internal Medicine | Admitting: Internal Medicine

## 2017-03-29 ENCOUNTER — Emergency Department (HOSPITAL_COMMUNITY): Payer: Managed Care, Other (non HMO)

## 2017-03-29 ENCOUNTER — Encounter (HOSPITAL_COMMUNITY): Payer: Self-pay

## 2017-03-29 DIAGNOSIS — C3490 Malignant neoplasm of unspecified part of unspecified bronchus or lung: Secondary | ICD-10-CM | POA: Diagnosis present

## 2017-03-29 DIAGNOSIS — T380X5A Adverse effect of glucocorticoids and synthetic analogues, initial encounter: Secondary | ICD-10-CM | POA: Diagnosis present

## 2017-03-29 DIAGNOSIS — A0471 Enterocolitis due to Clostridium difficile, recurrent: Secondary | ICD-10-CM | POA: Diagnosis not present

## 2017-03-29 DIAGNOSIS — Z79899 Other long term (current) drug therapy: Secondary | ICD-10-CM

## 2017-03-29 DIAGNOSIS — Z66 Do not resuscitate: Secondary | ICD-10-CM | POA: Diagnosis present

## 2017-03-29 DIAGNOSIS — E2740 Unspecified adrenocortical insufficiency: Secondary | ICD-10-CM | POA: Diagnosis present

## 2017-03-29 DIAGNOSIS — Z7901 Long term (current) use of anticoagulants: Secondary | ICD-10-CM

## 2017-03-29 DIAGNOSIS — A0472 Enterocolitis due to Clostridium difficile, not specified as recurrent: Secondary | ICD-10-CM | POA: Diagnosis not present

## 2017-03-29 DIAGNOSIS — D899 Disorder involving the immune mechanism, unspecified: Secondary | ICD-10-CM | POA: Diagnosis present

## 2017-03-29 DIAGNOSIS — Z7952 Long term (current) use of systemic steroids: Secondary | ICD-10-CM

## 2017-03-29 DIAGNOSIS — C7931 Secondary malignant neoplasm of brain: Secondary | ICD-10-CM | POA: Diagnosis present

## 2017-03-29 DIAGNOSIS — C7951 Secondary malignant neoplasm of bone: Secondary | ICD-10-CM | POA: Diagnosis present

## 2017-03-29 DIAGNOSIS — K5100 Ulcerative (chronic) pancolitis without complications: Secondary | ICD-10-CM | POA: Diagnosis present

## 2017-03-29 DIAGNOSIS — F17210 Nicotine dependence, cigarettes, uncomplicated: Secondary | ICD-10-CM | POA: Diagnosis present

## 2017-03-29 DIAGNOSIS — N183 Chronic kidney disease, stage 3 (moderate): Secondary | ICD-10-CM | POA: Diagnosis present

## 2017-03-29 DIAGNOSIS — Z79890 Hormone replacement therapy (postmenopausal): Secondary | ICD-10-CM

## 2017-03-29 DIAGNOSIS — E876 Hypokalemia: Secondary | ICD-10-CM | POA: Diagnosis present

## 2017-03-29 DIAGNOSIS — Z96642 Presence of left artificial hip joint: Secondary | ICD-10-CM | POA: Diagnosis present

## 2017-03-29 DIAGNOSIS — Z85118 Personal history of other malignant neoplasm of bronchus and lung: Secondary | ICD-10-CM

## 2017-03-29 HISTORY — DX: Major depressive disorder, single episode, unspecified: F32.9

## 2017-03-29 HISTORY — DX: Depression, unspecified: F32.A

## 2017-03-29 LAB — C DIFFICILE QUICK SCREEN W PCR REFLEX
C DIFFICLE (CDIFF) ANTIGEN: POSITIVE — AB
C Diff toxin: NEGATIVE

## 2017-03-29 LAB — URINALYSIS, ROUTINE W REFLEX MICROSCOPIC
BILIRUBIN URINE: NEGATIVE
GLUCOSE, UA: NEGATIVE mg/dL
Ketones, ur: 20 mg/dL — AB
LEUKOCYTES UA: NEGATIVE
NITRITE: NEGATIVE
PROTEIN: 100 mg/dL — AB
SPECIFIC GRAVITY, URINE: 1.024 (ref 1.005–1.030)
pH: 5 (ref 5.0–8.0)

## 2017-03-29 LAB — CBC
HEMATOCRIT: 45 % (ref 39.0–52.0)
HEMOGLOBIN: 16 g/dL (ref 13.0–17.0)
MCH: 30.4 pg (ref 26.0–34.0)
MCHC: 35.6 g/dL (ref 30.0–36.0)
MCV: 85.6 fL (ref 78.0–100.0)
Platelets: 323 10*3/uL (ref 150–400)
RBC: 5.26 MIL/uL (ref 4.22–5.81)
RDW: 13.9 % (ref 11.5–15.5)
WBC: 24.3 10*3/uL — AB (ref 4.0–10.5)

## 2017-03-29 LAB — COMPREHENSIVE METABOLIC PANEL
ALBUMIN: 3.7 g/dL (ref 3.5–5.0)
ALK PHOS: 56 U/L (ref 38–126)
ALT: 13 U/L — ABNORMAL LOW (ref 17–63)
ANION GAP: 11 (ref 5–15)
AST: 17 U/L (ref 15–41)
BILIRUBIN TOTAL: 0.8 mg/dL (ref 0.3–1.2)
BUN: 20 mg/dL (ref 6–20)
CALCIUM: 8.3 mg/dL — AB (ref 8.9–10.3)
CO2: 25 mmol/L (ref 22–32)
Chloride: 97 mmol/L — ABNORMAL LOW (ref 101–111)
Creatinine, Ser: 1.65 mg/dL — ABNORMAL HIGH (ref 0.61–1.24)
GFR, EST AFRICAN AMERICAN: 50 mL/min — AB (ref >60)
GFR, EST NON AFRICAN AMERICAN: 43 mL/min — AB (ref >60)
Glucose, Bld: 87 mg/dL (ref 65–99)
POTASSIUM: 3.2 mmol/L — AB (ref 3.5–5.1)
Sodium: 133 mmol/L — ABNORMAL LOW (ref 135–145)
TOTAL PROTEIN: 7.5 g/dL (ref 6.5–8.1)

## 2017-03-29 LAB — LIPASE, BLOOD: Lipase: 24 U/L (ref 11–51)

## 2017-03-29 MED ORDER — IOPAMIDOL (ISOVUE-300) INJECTION 61%
100.0000 mL | Freq: Once | INTRAVENOUS | Status: AC | PRN
  Administered 2017-03-29: 80 mL via INTRAVENOUS

## 2017-03-29 MED ORDER — IOPAMIDOL (ISOVUE-300) INJECTION 61%
INTRAVENOUS | Status: AC
  Filled 2017-03-29: qty 100

## 2017-03-29 NOTE — ED Provider Notes (Signed)
[facility] DEPT Provider Note   CSN: [id number] Arrival date & time: 03/29/17  1501     History   Chief Complaint Chief Complaint  Patient presents with  . Diarrhea  . Abdominal Pain    HPI Jeffery Cobb is a 61 y.o. male who  has a past medical history of Cancer ([place]), Adfrenal insufficiency ON hydrocortisone and  had C-diff infection recently. He was treated with abx and sxs resolved. Last wed. He began to feel fatigued and left work early. That nigh he began having loose mucousy and watery stools hourly. He has had about 12 watery stools daily. He denies melena or hematochezia. Denies fever, chills or other constitutional sxs. He has had nausea and retching without vomiting. He has not had solid foods for the past 2 days but has taken fluids. He denies contacts with similar sxs, ingestion of suspect foods or water, history of similar sxs, recent foreign travel . He has  No abdominal shx. He says that his sxs feel exactly the same as previous C-diff.   HPI  Past Medical History:  Diagnosis Date  . Cancer [facility])    lung    Patient Active Problem List   Diagnosis Date Noted  . ICH (intracerebral hemorrhage) ([place]) 07/14/2016  . Portal vein thrombosis 07/14/2016  . Short-term memory loss 07/14/2016  . Brain metastases ([place]) 07/14/2016  . Metastatic lung cancer (metastasis from lung to other site) ([place]) 07/13/2016  . Brain metastasis ([place]) 07/13/2016    Past Surgical History:  Procedure Laterality Date  . JOINT REPLACEMENT Left 6.20/2018   hip       Home Medications    Prior to Admission medications   Medication Sig Start Date End Date Taking? Authorizing Provider  buPROPion (ZYBAN) 150 MG 12 hr tablet Take 150 mg by mouth 2 (two) times daily.    [provider]  citalopram (CELEXA) 20 MG tablet Take 30 mg by mouth daily. 06/26/16   [provider]  dexamethasone (DECADRON) 2 MG tablet Take 2 tablets (4 mg total) by mouth 2 (two) times daily  with a meal. 4 mg twice daily for 5 days then 2 mg twice daily for 5 days then 1 tablet daily for 5 days Patient not taking: Reported on 03/21/2017 07/14/16   Caren Griffins, MD  enoxaparin (LOVENOX) 80 MG/0.8ML injection Inject 0.8 mLs into the skin every 12 (twelve) hours. 06/25/16   [provider]  hydrocortisone (CORTEF) 5 MG tablet Take 5 mg by mouth daily.    [provider]  lamoTRIgine (LAMICTAL) 200 MG tablet Take 200 mg by mouth daily.    [provider]  LORazepam (ATIVAN) 1 MG tablet Take 1 mg by mouth every 8 (eight) hours as needed for anxiety. 06/08/16   [provider]  rosuvastatin (CRESTOR) 5 MG tablet Take 5 mg by mouth daily. 06/22/16   [provider]  Testosterone (ANDROGEL) 20.25 MG/1.25GM (1.62%) GEL Apply 2 packets topically See admin instructions. To the shoulders and or upper arms once a day 03/07/16   [provider]  testosterone cypionate (DEPOTESTOSTERONE CYPIONATE) 200 MG/ML injection Inject 1 mL into the muscle See admin instructions. Every 10 days 02/03/16   [provider]    Family History Family History  Problem Relation Age of Onset  . Lung cancer Mother   . Breast cancer Sister     Social History Social History  Substance Use Topics  . Smoking status: Former Smoker    Quit date:  1987  . Smokeless tobacco: Current User  . Alcohol use No     Allergies   Patient has no known allergies.   Review of Systems Review of Systems Ten systems reviewed and are negative for acute change, except as noted in the HPI.    Physical Exam Updated Vital Signs BP 111/70 (BP Location: Left Arm)   Pulse 89   Temp 98.3 F (36.8 C) (Oral)   Resp 14   SpO2 94%   Physical Exam  Constitutional: He appears well-developed and well-nourished. No distress.  HENT:  Head: Normocephalic and atraumatic.  Dry oral mucosa  Eyes: Conjunctivae are normal. No scleral icterus.  Neck: Normal range of motion.  Neck supple.  Cardiovascular: Normal rate, regular rhythm and normal heart sounds.   Pulmonary/Chest: Effort normal and breath sounds normal. No respiratory distress.  Abdominal: Soft. He exhibits no distension. There is tenderness. There is guarding.  BL lower abdominal quadrants  Musculoskeletal: He exhibits no edema.  Neurological: He is alert.  Skin: Skin is warm and dry. He is not diaphoretic.  Psychiatric: His behavior is normal.  Nursing note and vitals reviewed.    ED Treatments / Results  Labs (all labs ordered are listed, but only abnormal results are displayed) Labs Reviewed  COMPREHENSIVE METABOLIC PANEL - Abnormal; Notable for the following:       Result Value   Sodium 133 (*)    Potassium 3.2 (*)    Chloride 97 (*)    Creatinine, Ser 1.65 (*)    Calcium 8.3 (*)    ALT 13 (*)    GFR calc non Af Amer 43 (*)    GFR calc Af Amer 50 (*)    All other components within normal limits  CBC - Abnormal; Notable for the following:    WBC 24.3 (*)    All other components within normal limits  URINALYSIS, ROUTINE W REFLEX MICROSCOPIC - Abnormal; Notable for the following:    APPearance HAZY (*)    Hgb urine dipstick SMALL (*)    Ketones, ur 20 (*)    Protein, ur 100 (*)    Bacteria, UA RARE (*)    Squamous Epithelial / LPF 0-5 (*)    All other components within normal limits  C DIFFICILE QUICK SCREEN W PCR REFLEX  LIPASE, BLOOD    EKG  EKG Interpretation None       Radiology No results found.  Procedures Procedures (including critical care time)  Medications Ordered in ED Medications - No data to display   Initial Impression / Assessment and Plan / ED Course  I have reviewed the triage vital signs and the nursing notes.  Pertinent labs & imaging results that were available during my care of the patient were reviewed by me and considered in my medical decision making (see chart for details).  Clinical Course as of Mar 30 8  Fri Mar 29, 2017  2143  WBC: (!) 24.3 [AH]  2312 Positive c-diff.  C Diff antigen: Marland Kitchen POSITIVE [AH]    Clinical Course User Index [AH] Margarita Mail, PA-C    Patient with recurrent C. Difficile pancolitis. No evidence of bowel perforation or peritonitis. Patient will need inpatient treatment. He has been treated with oral Flagyl. I discussed the case with Dr. Ernesto Rutherford who will admit the patient. Patient agrees with admission and plan for care. He declines pain medication or antinausea medicine.  Final Clinical Impressions(s) / ED Diagnoses   Final diagnoses:  None  New Prescriptions New Prescriptions   No medications on file     Ned Grace 03/30/17 0010    Carmin Muskrat, MD 03/30/17 [phone]

## 2017-03-29 NOTE — ED Triage Notes (Signed)
Pt having ongoing abdominal pain and diarrhea since Wednesday.  Recent tx of antibiotics.  [facility] sent her for eval.  Elevated WBC

## 2017-03-30 ENCOUNTER — Encounter (HOSPITAL_COMMUNITY): Payer: Self-pay

## 2017-03-30 DIAGNOSIS — Z79890 Hormone replacement therapy (postmenopausal): Secondary | ICD-10-CM | POA: Diagnosis not present

## 2017-03-30 DIAGNOSIS — C7951 Secondary malignant neoplasm of bone: Secondary | ICD-10-CM | POA: Diagnosis present

## 2017-03-30 DIAGNOSIS — N183 Chronic kidney disease, stage 3 (moderate): Secondary | ICD-10-CM | POA: Diagnosis present

## 2017-03-30 DIAGNOSIS — D899 Disorder involving the immune mechanism, unspecified: Secondary | ICD-10-CM | POA: Diagnosis present

## 2017-03-30 DIAGNOSIS — C3490 Malignant neoplasm of unspecified part of unspecified bronchus or lung: Secondary | ICD-10-CM | POA: Diagnosis present

## 2017-03-30 DIAGNOSIS — Z66 Do not resuscitate: Secondary | ICD-10-CM | POA: Diagnosis present

## 2017-03-30 DIAGNOSIS — F17210 Nicotine dependence, cigarettes, uncomplicated: Secondary | ICD-10-CM | POA: Diagnosis present

## 2017-03-30 DIAGNOSIS — Z85118 Personal history of other malignant neoplasm of bronchus and lung: Secondary | ICD-10-CM | POA: Diagnosis not present

## 2017-03-30 DIAGNOSIS — Z96642 Presence of left artificial hip joint: Secondary | ICD-10-CM | POA: Diagnosis present

## 2017-03-30 DIAGNOSIS — E876 Hypokalemia: Secondary | ICD-10-CM | POA: Diagnosis present

## 2017-03-30 DIAGNOSIS — C7931 Secondary malignant neoplasm of brain: Secondary | ICD-10-CM | POA: Diagnosis present

## 2017-03-30 DIAGNOSIS — Z7952 Long term (current) use of systemic steroids: Secondary | ICD-10-CM | POA: Diagnosis not present

## 2017-03-30 DIAGNOSIS — A0472 Enterocolitis due to Clostridium difficile, not specified as recurrent: Secondary | ICD-10-CM

## 2017-03-30 DIAGNOSIS — K5100 Ulcerative (chronic) pancolitis without complications: Secondary | ICD-10-CM | POA: Diagnosis not present

## 2017-03-30 DIAGNOSIS — Z7901 Long term (current) use of anticoagulants: Secondary | ICD-10-CM | POA: Diagnosis not present

## 2017-03-30 DIAGNOSIS — A0471 Enterocolitis due to Clostridium difficile, recurrent: Secondary | ICD-10-CM | POA: Diagnosis present

## 2017-03-30 DIAGNOSIS — Z79899 Other long term (current) drug therapy: Secondary | ICD-10-CM | POA: Diagnosis not present

## 2017-03-30 DIAGNOSIS — E2740 Unspecified adrenocortical insufficiency: Secondary | ICD-10-CM | POA: Diagnosis present

## 2017-03-30 DIAGNOSIS — T380X5A Adverse effect of glucocorticoids and synthetic analogues, initial encounter: Secondary | ICD-10-CM | POA: Diagnosis present

## 2017-03-30 LAB — BASIC METABOLIC PANEL
ANION GAP: 15 (ref 5–15)
BUN: 19 mg/dL (ref 6–20)
CHLORIDE: 96 mmol/L — AB (ref 101–111)
CO2: 23 mmol/L (ref 22–32)
Calcium: 8.3 mg/dL — ABNORMAL LOW (ref 8.9–10.3)
Creatinine, Ser: 1.52 mg/dL — ABNORMAL HIGH (ref 0.61–1.24)
GFR, EST AFRICAN AMERICAN: 55 mL/min — AB (ref >60)
GFR, EST NON AFRICAN AMERICAN: 48 mL/min — AB (ref >60)
GLUCOSE: 82 mg/dL (ref 65–99)
POTASSIUM: 2.9 mmol/L — AB (ref 3.5–5.1)
Sodium: 134 mmol/L — ABNORMAL LOW (ref 135–145)

## 2017-03-30 LAB — CBC
HEMATOCRIT: 40.1 % (ref 39.0–52.0)
Hemoglobin: 14.1 g/dL (ref 13.0–17.0)
MCH: 29.4 pg (ref 26.0–34.0)
MCHC: 35.2 g/dL (ref 30.0–36.0)
MCV: 83.7 fL (ref 78.0–100.0)
Platelets: 269 10*3/uL (ref 150–400)
RBC: 4.79 MIL/uL (ref 4.22–5.81)
RDW: 13.7 % (ref 11.5–15.5)
WBC: 18.2 10*3/uL — ABNORMAL HIGH (ref 4.0–10.5)

## 2017-03-30 LAB — HIV ANTIBODY (ROUTINE TESTING W REFLEX): HIV Screen 4th Generation wRfx: NONREACTIVE

## 2017-03-30 MED ORDER — VANCOMYCIN 50 MG/ML ORAL SOLUTION
125.0000 mg | Freq: Four times a day (QID) | ORAL | Status: DC
  Administered 2017-03-30 – 2017-04-01 (×10): 125 mg via ORAL
  Filled 2017-03-30 (×12): qty 2.5

## 2017-03-30 MED ORDER — LAMOTRIGINE 100 MG PO TABS
200.0000 mg | ORAL_TABLET | Freq: Every day | ORAL | Status: DC
  Administered 2017-03-30 – 2017-04-01 (×3): 200 mg via ORAL
  Filled 2017-03-30 (×4): qty 2

## 2017-03-30 MED ORDER — METRONIDAZOLE IN NACL 5-0.79 MG/ML-% IV SOLN
500.0000 mg | Freq: Three times a day (TID) | INTRAVENOUS | Status: DC
  Administered 2017-03-30 – 2017-03-31 (×5): 500 mg via INTRAVENOUS
  Filled 2017-03-30 (×10): qty 100

## 2017-03-30 MED ORDER — BUPROPION HCL ER (SR) 150 MG PO TB12
150.0000 mg | ORAL_TABLET | Freq: Every day | ORAL | Status: DC
  Administered 2017-03-30 – 2017-04-01 (×3): 150 mg via ORAL
  Filled 2017-03-30 (×4): qty 1

## 2017-03-30 MED ORDER — ACETAMINOPHEN 650 MG RE SUPP: 650.0000 mg | Freq: Four times a day (QID) | RECTAL | Status: DC | PRN

## 2017-03-30 MED ORDER — OXYCODONE-ACETAMINOPHEN 5-325 MG PO TABS
1.0000 | ORAL_TABLET | Freq: Four times a day (QID) | ORAL | Status: DC | PRN
  Administered 2017-03-30 – 2017-04-01 (×4): 2 via ORAL
  Filled 2017-03-30 (×4): qty 2

## 2017-03-30 MED ORDER — HYDROCORTISONE 20 MG PO TABS
20.0000 mg | ORAL_TABLET | Freq: Every day | ORAL | Status: DC
  Administered 2017-03-30 – 2017-04-01 (×3): 20 mg via ORAL
  Filled 2017-03-30 (×3): qty 1

## 2017-03-30 MED ORDER — POTASSIUM CHLORIDE IN NACL 20-0.9 MEQ/L-% IV SOLN
INTRAVENOUS | Status: DC
  Administered 2017-03-30 – 2017-03-31 (×5): via INTRAVENOUS
  Filled 2017-03-30 (×4): qty 1000

## 2017-03-30 MED ORDER — POTASSIUM CHLORIDE CRYS ER 20 MEQ PO TBCR
40.0000 meq | EXTENDED_RELEASE_TABLET | Freq: Once | ORAL | Status: AC
  Administered 2017-03-30: 40 meq via ORAL
  Filled 2017-03-30: qty 2

## 2017-03-30 MED ORDER — ACETAMINOPHEN 325 MG PO TABS
650.0000 mg | ORAL_TABLET | Freq: Four times a day (QID) | ORAL | Status: DC | PRN
  Administered 2017-03-30: 650 mg via ORAL
  Filled 2017-03-30: qty 2

## 2017-03-30 MED ORDER — HEPARIN SODIUM (PORCINE) 5000 UNIT/ML IJ SOLN
5000.0000 [IU] | Freq: Three times a day (TID) | INTRAMUSCULAR | Status: DC
  Administered 2017-03-30 – 2017-04-01 (×7): 5000 [IU] via SUBCUTANEOUS
  Filled 2017-03-30 (×7): qty 1

## 2017-03-30 NOTE — Progress Notes (Signed)
Dr. Cruzita Lederer aware via phone pt c/o increased back pain with no relief with Tylenol. Md aware pt tolerating diet with no NV. See new orders entered by MD.

## 2017-03-30 NOTE — H&P (Addendum)
PCP:  Sadie Haber Physician Onc: @ [facility] Code status: DNR  Chief Complaint:  diarrhea  HPI: This a 61 year old male who was diagnosed with C. difficile a few weeks ago. History 2010 days of by mouth Flagyl. He says his are went home for approximately a week but return on Wednesday. He has copious diarrhea, it is no decreases he stopped eating and tingling for the past 3 days. Reports abdominal pain located in the left lower quadrant as well as the right lower quadrant. His discomfort is crampy in nature. He reports chills but no fevers, nausea or vomiting. The patient has history of lung cancer with brain metastases. Due to his treatment he is addrenal insufficient and he is in chronic hydrocortisone therapy. He is do not this the past 3 years.  Review of Systems:  The patient denies anorexia, fever, weight loss,, vision loss, decreased hearing, hoarseness, chest pain, syncope, dyspnea on exertion, peripheral edema, balance deficits, hemoptysis, abdominal pain, diarrhea, melena, hematochezia, severe indigestion/heartburn, hematuria, incontinence, genital sores, muscle weakness, suspicious skin lesions, transient blindness, difficulty walking, depression, unusual weight change, abnormal bleeding, enlarged lymph nodes, angioedema, and breast masses.  Past Medical History: Past Medical History:  Diagnosis Date  . Cancer ([place])    lung   Past Surgical History:  Procedure Laterality Date  . JOINT REPLACEMENT Left 6.20/2018   hip    Medications: Prior to Admission medications   Medication Sig Start Date End Date Taking? Authorizing Provider  buPROPion (ZYBAN) 150 MG 12 hr tablet Take 150 mg by mouth daily.    Yes [provider]  lamoTRIgine (LAMICTAL) 200 MG tablet Take 200 mg by mouth daily.   Yes [provider]    Allergies:  No Known Allergies  Social History:  reports that he quit smoking about 31 years ago. He uses smokeless tobacco. He reports that he does not drink  alcohol or use drugs.  Family History: Family History  Problem Relation Age of Onset  . Lung cancer Mother   . Breast cancer Sister     Physical Exam: Vitals:   03/29/17 1527 03/29/17 1921 03/29/17 2143 03/29/17 2358  BP: 117/86 111/70 133/72 128/69  Pulse: 97 89 91 68  Resp: 18 14 14 14   Temp: 98.3 F (36.8 C)     TempSrc: Oral     SpO2: 98% 94% 94% 95%    General:  Alert and oriented times three, well developed and nourished, no acute distress Eyes: PERRLA, pink conjunctiva, no scleral icterus ENT: Moist oral mucosa, neck supple, no thyromegaly Lungs: clear to ascultation, no wheeze, no crackles, no use of accessory muscles Cardiovascular: regular rate and rhythm, no regurgitation, no gallops, no murmurs. No carotid bruits, no JVD Abdomen: soft, positive BS, mild-tendernrss to palpation, non-distended, no organomegaly, not an acute abdomen GU: not examined Neuro: CN II - XII grossly intact, sensation intact Musculoskeletal: strength 5/5 all extremities, no clubbing, cyanosis or edema Skin: no rash, no subcutaneous crepitation, no decubitus Psych: appropriate patient   Labs on Admission:   Recent Labs  03/29/17 1549  NA 133*  K 3.2*  CL 97*  CO2 25  GLUCOSE 87  BUN 20  CREATININE 1.65*  CALCIUM 8.3*    Recent Labs  03/29/17 1549  AST 17  ALT 13*  ALKPHOS 56  BILITOT 0.8  PROT 7.5  ALBUMIN 3.7    Recent Labs  03/29/17 1549  LIPASE 24    Recent Labs  03/29/17 1549  WBC 24.3*  HGB 16.0  HCT 45.0  MCV 85.6  PLT 323   No results for input(s): CKTOTAL, CKMB, CKMBINDEX, TROPONINI in the last 72 hours. Invalid input(s): POCBNP No results for input(s): DDIMER in the last 72 hours. No results for input(s): HGBA1C in the last 72 hours. No results for input(s): CHOL, HDL, LDLCALC, TRIG, CHOLHDL, LDLDIRECT in the last 72 hours. No results for input(s): TSH, T4TOTAL, T3FREE, THYROIDAB in the last 72 hours.  Invalid input(s): FREET3 No results for  input(s): VITAMINB12, FOLATE, FERRITIN, TIBC, IRON, RETICCTPCT in the last 72 hours.  Micro Results: Recent Results (from the past 240 hour(s))  C difficile quick scan w PCR reflex     Status: Abnormal   Collection Time: 03/29/17  8:04 PM  Result Value Ref Range Status   C Diff antigen POSITIVE (A) NEGATIVE Final   C Diff toxin NEGATIVE NEGATIVE Final   C Diff interpretation Results are indeterminate. See PCR results.  Final     Radiological Exams on Admission: Ct Abdomen Pelvis W Contrast  Result Date: 03/29/2017 CLINICAL DATA:  Abdominal pain and diarrhea since Wednesday. Recent course of antibiotics. Leukocytosis. History of metastatic lung cancer with brain metastasis. EXAM: CT ABDOMEN AND PELVIS WITH CONTRAST TECHNIQUE: Multidetector CT imaging of the abdomen and pelvis was performed using the standard protocol following bolus administration of intravenous contrast. CONTRAST:  69mL ISOVUE-300 IOPAMIDOL (ISOVUE-300) INJECTION 61% COMPARISON:  01/01/2013 CT FINDINGS: Lower chest: Right lower lobe bronchiectasis with subpleural atelectasis and/or scarring. Coronary arteriosclerosis noted of the top-normal sized heart. No pericardial effusion. No dominant mass identified at the lung bases. Hepatobiliary: The hypodense right hepatic lobe lesions are less prominent or no longer present on current exam likely representing post treatment change. The largest is currently 1.9 x 1.8 cm with central calcification versus 5.9 x 4.7 cm in 2014. The more medial lesion is no longer identified. No new hepatic lesion or biliary dilatation is noted. The gallbladder is physiologically distended without stones. Pancreas: Normal Spleen: Normal Adrenals/Urinary Tract: No adrenal mass. The kidneys enhance homogeneously with small 12 mm and 4 mm cysts suspected within the lower pole the right kidney. Tiny hypodensities are also seen in the lower pole the left kidney measuring up to 4 mm. No nephrolithiasis nor  obstructive uropathy. The urinary bladder is physiologically distended. Stomach/Bowel: There is diffuse transmural thickening with mucosal enhancement from cecum through rectum consistent with a pain colitis and proctitis. No bowel perforation. No small bowel dilatation. The stomach is contracted. No abscess. Vascular/Lymphatic: Aortoiliac atherosclerosis without aneurysm. No lymphadenopathy by CT size criteria. Reproductive: Prostate is unremarkable. Other: Right femoral nail fixation and a left total hip arthroplasty are noted with metallic streak artifacts limiting assessment of the lower pelvis. Scarring is seen overlying the lateral aspect of the left hip. No inguinal adenopathy is noted. Musculoskeletal: No acute nor suspicious osseous abnormality. Mild degenerate change at L5-S1. IMPRESSION: 1. There is moderate diffuse transmural thickening and inflammation noted of the colon from cecum to rectum. Findings are in keeping with proctocolitis. No bowel perforation or abscess. No bowel obstruction. 2. Post treatment appearance of the right hepatic lobe. 3. Small bilateral renal cysts some of which are too small to further characterize. 4. Coronary arteriosclerosis and aortic atherosclerosis. 5. Right lower lobe subsegmental scarring and/or atelectasis with bronchiectasis. Electronically Signed   By: Ashley Royalty M.D.   On: 03/29/2017 23:25    Assessment/Plan Present on Admission: . Pancolitis ([place]) -Admit to MedSurg -By mouth Vanco and IV Flagyl ordered -IV fluid  hydration  . C. difficile colitis -See above  Hypokalemia -Replete in IV fluid  Immunocompromised patient -Due to chronic steroid use. Unable to discontinue steroid due to his adrenal insufficiency -Will need extended treatment for his C. difficile  addrenal insufficiency -Home meds resumed  Chronic kidney injury -Basically at baseline. IV fluid hydration ordered. Carefully potassium added -repeat BMP in AM  . Brain metastasis  ([place]) -aware  H/o Lung cancer -aware  Felis Quillin 03/30/2017, 12:02 AM

## 2017-03-30 NOTE — Progress Notes (Signed)
Patient seen and examined this morning, admitted overnight by Dr. Ernesto Rutherford, in brief this is a pleasant 61 year old gentleman with a recent diagnosis of C. difficile about one half weeks ago, placed on metronidazole which resulted in improvement in his diarrhea and abdominal discomfort.  About 4-5 days after he finished the metronidazole, patient had return of his symptoms with abdominal cramping, decreased appetite, nausea and vomiting as well as multiple watery bowel movements   Pancolitis due to clostridium difficile -Treated as an outpatient with metronidazole twice daily for 10 days -Placed on oral vancomycin as well as IV Flagyl, continue -Minimal abdominal pain to palpation, no guarding  Hypokalemia -Continue to replete  Renal insufficiency -Resulted in the setting of chronic steroid use, continue home regimen  Chronic kidney disease stage III -Creatinine at baseline  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 Cruzita Lederer, MD [facility] [phone]  If 7PM-7AM, please contact night-coverage [email or web address] Password [email or web address]

## 2017-03-31 DIAGNOSIS — K5100 Ulcerative (chronic) pancolitis without complications: Secondary | ICD-10-CM

## 2017-03-31 DIAGNOSIS — Z85118 Personal history of other malignant neoplasm of bronchus and lung: Secondary | ICD-10-CM

## 2017-03-31 LAB — BASIC METABOLIC PANEL
Anion gap: 9 (ref 5–15)
BUN: 12 mg/dL (ref 6–20)
CHLORIDE: 103 mmol/L (ref 101–111)
CO2: 26 mmol/L (ref 22–32)
Calcium: 8.3 mg/dL — ABNORMAL LOW (ref 8.9–10.3)
Creatinine, Ser: 1.19 mg/dL (ref 0.61–1.24)
GFR calc Af Amer: >60 mL/min (ref >60)
GFR calc non Af Amer: >60 mL/min (ref >60)
Glucose, Bld: 91 mg/dL (ref 65–99)
POTASSIUM: 3.2 mmol/L — AB (ref 3.5–5.1)
SODIUM: 138 mmol/L (ref 135–145)

## 2017-03-31 LAB — CBC
HCT: 37.3 % — ABNORMAL LOW (ref 39.0–52.0)
HEMOGLOBIN: 13.1 g/dL (ref 13.0–17.0)
MCH: 29.8 pg (ref 26.0–34.0)
MCHC: 35.1 g/dL (ref 30.0–36.0)
MCV: 84.8 fL (ref 78.0–100.0)
Platelets: 307 10*3/uL (ref 150–400)
RBC: 4.4 MIL/uL (ref 4.22–5.81)
RDW: 13.9 % (ref 11.5–15.5)
WBC: 9.8 10*3/uL (ref 4.0–10.5)

## 2017-03-31 MED ORDER — POTASSIUM CHLORIDE CRYS ER 20 MEQ PO TBCR
40.0000 meq | EXTENDED_RELEASE_TABLET | Freq: Once | ORAL | Status: AC
  Administered 2017-03-31: 40 meq via ORAL
  Filled 2017-03-31: qty 2

## 2017-03-31 NOTE — Progress Notes (Signed)
PROGRESS NOTE  Jeffery Cobb VQM:[id number] DOB: 09/25/1955 DOA: 03/29/2017 PCP: System, Pcp Not In   LOS: 1 day   Brief Narrative / Interim history: 61 year old gentleman with stage IV lung cancer, chronic kidney disease stage III, adrenal insufficiency, and a recent diagnosis of C. difficile about one half weeks ago, placed on metronidazole which resulted in improvement in his diarrhea and abdominal discomfort.  About 4-5 days after he finished the metronidazole, patient had return of his symptoms with abdominal cramping, decreased appetite, nausea and vomiting as well as multiple watery bowel movements  Assessment & Plan: Principal Problem:   Pancolitis ([place]) Active Problems:   Brain metastasis (HCC)   C. 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 Still with watery BMs however fewer  Objective: Vitals:   03/30/17 1400 03/30/17 1427 03/30/17 2301 03/31/17 0633  BP: 116/62  (!) 110/58 111/67  Pulse: 72  74 70  Resp: 16  18 18   Temp: 98.5 F (36.9 C)  97.7 F (36.5 C) 98.1  F (36.7 C)  TempSrc: Oral  Oral Oral  SpO2: 98%  96% 99%  Weight:      Height:  5\' 10"  (1.778 m)      Intake/Output Summary (Last 24 hours) at 03/31/17 1249 Last data filed at 03/31/17 0600  Gross per 24 hour  Intake             2100 ml  Output                2 ml  Net             2098 ml   Filed Weights   03/30/17 0119  Weight: 83.6 kg (184 lb 4.9 oz)    Examination:  Vitals:   03/30/17 1400 03/30/17 1427 03/30/17 2301 03/31/17 0633  BP: 116/62  (!) 110/58 111/67  Pulse: 72  74 70  Resp: 16  18 18   Temp: 98.5 F (36.9 C)  97.7 F (36.5 C) 98.1 F (36.7 C)  TempSrc: Oral  Oral Oral  SpO2: 98%  96% 99%  Weight:      Height:  5\' 10"  (1.778 m)      Constitutional: NAD Eyes: lids and conjunctivae normal Respiratory: clear to auscultation bilaterally, no wheezing, no crackles. Normal respiratory effort.  Cardiovascular: Regular rate and rhythm, no murmurs / rubs / gallops. No LE edema.  Abdomen: no tenderness. Bowel sounds positive.  Musculoskeletal: no clubbing / cyanosis.  Neurologic: non focal    Data Reviewed: I have independently reviewed following labs and imaging studies   CBC:  Recent Labs Lab 03/29/17 1549 03/30/17 0555 03/31/17 1035  WBC 24.3* 18.2* 9.8  HGB 16.0 14.1 13.1  HCT 45.0 40.1 37.3*  MCV 85.6 83.7 84.8  PLT 323 269 242   Basic Metabolic Panel:  Recent Labs Lab 03/29/17 1549 03/30/17 0555 03/31/17 1035  NA 133* 134* 138  K 3.2* 2.9* 3.2*  CL 97* 96* 103  CO2 25 23 26   GLUCOSE 87 82 91  BUN 20 19 12   CREATININE 1.65* 1.52* 1.19  CALCIUM 8.3* 8.3* 8.3*   GFR: Estimated Creatinine Clearance: 67.3 mL/min (by C-G formula based on SCr of 1.19 mg/dL). Liver Function Tests:  Recent Labs Lab 03/29/17 1549  AST 17  ALT 13*  ALKPHOS 56  BILITOT 0.8  PROT 7.5  ALBUMIN 3.7    Recent Labs Lab 03/29/17 1549  LIPASE 24   No results for input(s): AMMONIA in the last 168 hours. Coagulation Profile: No results for  input(s): INR, PROTIME in the last 168 hours. Cardiac Enzymes: No results for input(s): CKTOTAL, CKMB, CKMBINDEX, TROPONINI in the last 168 hours. BNP (last 3 results) No results for input(s): PROBNP in the last 8760 hours. HbA1C: No results for input(s): HGBA1C in the last 72 hours. CBG: No results for input(s): GLUCAP in the last 168 hours. Lipid Profile: No results for input(s): CHOL, HDL, LDLCALC, TRIG, CHOLHDL, LDLDIRECT in the last 72 hours. Thyroid Function Tests: No results for input(s): TSH, T4TOTAL, FREET4, T3FREE, THYROIDAB in the last 72 hours. Anemia Panel: No results for input(s): VITAMINB12, FOLATE, FERRITIN, TIBC, IRON, RETICCTPCT in the last 72 hours. Urine analysis:    Component Value Date/Time   COLORURINE YELLOW 03/29/2017 1922   APPEARANCEUR HAZY (A) 03/29/2017 1922   LABSPEC 1.024 03/29/2017 1922   PHURINE 5.0 03/29/2017 1922   GLUCOSEU NEGATIVE 03/29/2017 1922   HGBUR SMALL (A) 03/29/2017 [place] NEGATIVE 03/29/2017 1922   KETONESUR 20 (A) 03/29/2017 1922   PROTEINUR 100 (A) 03/29/2017 1922   NITRITE NEGATIVE 03/29/2017 1922   LEUKOCYTESUR NEGATIVE 03/29/2017 1922   Sepsis Labs: Invalid input(s): PROCALCITONIN, LACTICIDVEN  Recent Results (from the past 240 hour(s))  C difficile quick scan w PCR reflex     Status: Abnormal   Collection Time: 03/29/17  8:04 PM  Result Value Ref Range Status   C Diff antigen POSITIVE (A) NEGATIVE Final   C Diff toxin NEGATIVE NEGATIVE Final   C Diff interpretation Results are indeterminate. See PCR results.  Final      Radiology Studies: Ct Abdomen Pelvis W Contrast  Result Date: 03/29/2017 CLINICAL DATA:  Abdominal pain and diarrhea since Wednesday. Recent course of antibiotics. Leukocytosis. History of metastatic lung cancer with brain metastasis. EXAM: CT ABDOMEN AND PELVIS WITH CONTRAST TECHNIQUE: Multidetector CT imaging of the abdomen and pelvis was performed using the standard protocol following  bolus administration of intravenous contrast. CONTRAST:  78mL ISOVUE-300 IOPAMIDOL (ISOVUE-300) INJECTION 61% COMPARISON:  01/01/2013 CT FINDINGS: Lower chest: Right lower lobe bronchiectasis with subpleural atelectasis and/or scarring. Coronary arteriosclerosis noted of the top-normal sized heart. No pericardial effusion. No dominant mass identified at the lung bases. Hepatobiliary: The hypodense right hepatic lobe lesions are less prominent or no longer present on current exam likely representing post treatment change. The largest is currently 1.9 x 1.8 cm with central calcification versus 5.9 x 4.7 cm in 2014. The more medial lesion is no longer identified. No new hepatic lesion or biliary dilatation is noted. The gallbladder is physiologically distended without stones. Pancreas: Normal Spleen: Normal Adrenals/Urinary Tract: No adrenal mass. The kidneys enhance  homogeneously with small 12 mm and 4 mm cysts suspected within the lower pole the right kidney. Tiny hypodensities are also seen in the lower pole the left kidney measuring up to 4 mm. No nephrolithiasis nor obstructive uropathy. The urinary bladder is physiologically distended. Stomach/Bowel: There is diffuse transmural thickening with mucosal enhancement from cecum through rectum consistent with a pain colitis and proctitis. No bowel perforation. No small bowel dilatation. The stomach is contracted. No abscess. Vascular/Lymphatic: Aortoiliac atherosclerosis without aneurysm. No lymphadenopathy by CT size criteria. Reproductive: Prostate is unremarkable. Other: Right femoral nail fixation and a left total hip arthroplasty are noted with metallic streak artifacts limiting assessment of the lower pelvis. Scarring is seen overlying the lateral aspect of the left hip. No inguinal adenopathy is noted. Musculoskeletal: No acute nor suspicious osseous abnormality. Mild degenerate change at L5-S1. IMPRESSION: 1. There is moderate diffuse transmural thickening and  inflammation noted of the colon from cecum to rectum. Findings are in keeping with proctocolitis. No bowel perforation or abscess. No bowel obstruction. 2. Post treatment appearance of the right hepatic lobe. 3. Small bilateral renal cysts some of which are too small to further characterize. 4. Coronary arteriosclerosis and aortic atherosclerosis. 5. Right lower lobe subsegmental scarring and/or atelectasis with bronchiectasis. Electronically Signed   By: Ashley Royalty M.D.   On: 03/29/2017 23:25     Scheduled Meds: . buPROPion  150 mg Oral Daily  . heparin  5,000 Units Subcutaneous Q8H  . hydrocortisone  20 mg Oral Daily  . lamoTRIgine  200 mg Oral Daily  . potassium chloride  40 mEq Oral Once  . vancomycin  125 mg Oral Q6H   Continuous Infusions: . 0.9 % NaCl with KCl 20 mEq / L 100 mL/hr at 03/31/17 0100  . metronidazole Stopped (03/31/17 0715)      Marzetta Board, MD, PhD [facility] Pager [phone]  If 7PM-7AM, please contact night-coverage [email or web address] Password [email or web address] 03/31/2017, 12:49 PM

## 2017-04-01 LAB — BASIC METABOLIC PANEL
Anion gap: 8 (ref 5–15)
BUN: 12 mg/dL (ref 6–20)
CALCIUM: 8.6 mg/dL — AB (ref 8.9–10.3)
CO2: 27 mmol/L (ref 22–32)
CREATININE: 1.24 mg/dL (ref 0.61–1.24)
Chloride: 103 mmol/L (ref 101–111)
GFR calc non Af Amer: >60 mL/min (ref >60)
Glucose, Bld: 94 mg/dL (ref 65–99)
Potassium: 2.8 mmol/L — ABNORMAL LOW (ref 3.5–5.1)
SODIUM: 138 mmol/L (ref 135–145)

## 2017-04-01 MED ORDER — POTASSIUM CHLORIDE CRYS ER 20 MEQ PO TBCR
30.0000 meq | EXTENDED_RELEASE_TABLET | ORAL | Status: AC
  Administered 2017-04-01 (×3): 30 meq via ORAL
  Filled 2017-04-01 (×3): qty 1

## 2017-04-01 MED ORDER — SACCHAROMYCES BOULARDII 250 MG PO CAPS: 250.0000 mg | ORAL_CAPSULE | Freq: Two times a day (BID) | ORAL | 0 refills | Status: DC

## 2017-04-01 MED ORDER — VANCOMYCIN HCL 125 MG PO CAPS: 125.0000 mg | ORAL_CAPSULE | Freq: Four times a day (QID) | ORAL | 0 refills | Status: DC

## 2017-04-01 MED ORDER — SACCHAROMYCES BOULARDII 250 MG PO CAPS
250.0000 mg | ORAL_CAPSULE | Freq: Two times a day (BID) | ORAL | Status: DC
  Administered 2017-04-01: 250 mg via ORAL
  Filled 2017-04-01: qty 1

## 2017-04-01 MED ORDER — POTASSIUM CHLORIDE ER 20 MEQ PO TBCR: 40.0000 meq | EXTENDED_RELEASE_TABLET | Freq: Every day | ORAL | 0 refills | Status: DC

## 2017-04-01 NOTE — Discharge Summary (Signed)
Physician Discharge Summary  Jeffery Cobb WYO:[id number] DOB: 05/02/1956 DOA: 03/29/2017  PCP: System, Pcp Not In  Admit date: 03/29/2017 Discharge date: 04/01/2017  Admitted From: home Disposition:  home  Recommendations for Outpatient Follow-up:  1. Follow up with PCP in 1-2 weeks 2. Please obtain BMP/CBC in one week  Home Health: none Equipment/Devices: none  Discharge Condition: stable CODE STATUS: DNR Diet recommendation: regular  HPI: Per Dr. Claria Dice, This a 61 year old male who was diagnosed with C. difficile a few weeks ago. History 2010 days of by mouth Flagyl. He says his are went home for approximately a week but return on Wednesday. He has copious diarrhea, it is no decreases he stopped eating and tingling for the past 3 days. Reports abdominal pain located in the left lower quadrant as well as the right lower quadrant. His discomfort is crampy in nature. He reports chills but no fevers, nausea or vomiting. The patient has history of lung cancer with brain metastases. Due to his treatment he is addrenal insufficient and he is in chronic hydrocortisone therapy. He is do not this the past 3 years.  Hospital Course: Discharge Diagnoses:  Principal Problem:   Pancolitis [facility]) Active Problems:   Brain metastasis (HCC)   C. difficile colitis   H/O: lung cancer   Pancolitis due to clostridium difficile -Treated as an outpatientwith metronidazole twice daily for 10 days, recurring as soon as he finished his antibiotics. He was placed initially on oral vancomycin and IV Flagyl given significant abdominal pain.  He improved, his diarrhea slowed down, and his abdominal pain has resolved.  He was able to eat a regular diet without nausea and vomiting.  He is able to tolerate p.o. intake, and was discharged home in stable condition to complete a total 14 day of p.o. vancomycin Hypokalemia -due to diarrhea, repleted on the day of discharge.  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   citalopram 20 MG tablet Commonly known as:  CELEXA Take 20 mg by mouth daily.   lamoTRIgine 200 MG tablet Commonly known as:  LAMICTAL Take 200 mg by mouth daily.   LORazepam 1 MG tablet Commonly known as:  ATIVAN Take 1 mg by mouth every 8 (eight) hours.   Potassium Chloride ER 20 MEQ Tbcr Take 40 mEq by mouth daily.   rosuvastatin 5 MG tablet Commonly known as:  CRESTOR Take 5 mg by mouth daily.   saccharomyces boulardii 250 MG capsule Commonly known as:  FLORASTOR Take 1 capsule (250 mg total) by mouth 2 (two) times daily.   testosterone cypionate 100 MG/ML injection Commonly known as:  DEPOTESTOTERONE CYPIONATE Inject 200 mg into the muscle every 14 (fourteen) days. For IM use only   vancomycin 125 MG capsule Commonly known as:  VANCOCIN Take 1 capsule (125 mg total) by mouth 4 (four) times daily.       No Known Allergies  Consultations:  None   Procedures/Studies:  Ct Abdomen Pelvis W Contrast  Result Date: 03/29/2017 CLINICAL DATA:  Abdominal pain and diarrhea since Wednesday. Recent course of antibiotics. Leukocytosis. History of metastatic lung cancer with brain metastasis. EXAM: CT ABDOMEN AND PELVIS WITH CONTRAST TECHNIQUE: Multidetector CT imaging of the abdomen and pelvis was performed using the standard protocol following bolus administration of intravenous contrast. CONTRAST:  10mL ISOVUE-300 IOPAMIDOL (ISOVUE-300) INJECTION 61% COMPARISON:  01/01/2013 CT FINDINGS: Lower  chest: Right lower lobe bronchiectasis with subpleural atelectasis and/or scarring. Coronary arteriosclerosis noted of the top-normal sized heart. No pericardial effusion. No dominant mass identified at the lung bases. Hepatobiliary: The hypodense right hepatic lobe lesions are less prominent or no longer present on current exam likely representing post treatment change. The largest is currently 1.9 x 1.8 cm with central calcification versus 5.9 x 4.7 cm in 2014. The more medial lesion is no longer identified. No new hepatic lesion or biliary dilatation is noted. The gallbladder is physiologically distended without stones. Pancreas: Normal Spleen: Normal Adrenals/Urinary Tract: No adrenal mass. The kidneys enhance homogeneously with small 12 mm and 4 mm cysts suspected within the lower pole the right kidney. Tiny hypodensities are also seen in the lower pole the left kidney measuring up to 4 mm. No nephrolithiasis nor obstructive uropathy. The urinary bladder is physiologically distended. Stomach/Bowel: There is diffuse transmural thickening with mucosal enhancement from cecum through rectum consistent with a pain colitis and proctitis. No bowel perforation. No small bowel dilatation. The stomach is contracted. No abscess. Vascular/Lymphatic: Aortoiliac atherosclerosis without aneurysm. No lymphadenopathy by CT size criteria. Reproductive: Prostate is unremarkable. Other: Right femoral nail fixation and a left total hip arthroplasty are noted with metallic streak artifacts limiting assessment of the lower pelvis. Scarring is seen overlying the lateral aspect of the left hip. No inguinal adenopathy is noted. Musculoskeletal: No acute nor suspicious osseous abnormality. Mild degenerate change at L5-S1. IMPRESSION: 1. There is moderate diffuse transmural thickening and inflammation noted of the colon from cecum to rectum. Findings are in keeping with proctocolitis. No bowel perforation or abscess. No bowel obstruction.  2. Post treatment appearance of the right hepatic lobe. 3. Small bilateral renal cysts some of which are too small to further characterize. 4. Coronary arteriosclerosis and aortic atherosclerosis. 5. Right lower lobe subsegmental scarring and/or atelectasis with bronchiectasis. Electronically Signed   By: Ashley Royalty M.D.   On: 03/29/2017 23:25     Subjective: - no chest pain, shortness of breath, no abdominal pain, nausea or vomiting.   Discharge Exam: Vitals:   03/31/17 2127 04/01/17 0640  BP: 97/67 111/70  Pulse: 65 69  Resp: 18 18  Temp: 97.8 F (36.6 C) 98.3 F (36.8 C)  SpO2: 99% 99%   Vitals:   03/31/17 0633 03/31/17 1400 03/31/17 2127 04/01/17 0640  BP: 111/67 104/63 97/67 111/70  Pulse: 70 73 65 69  Resp: 18 18 18 18   Temp: 98.1 F (36.7 C) 98.1 F (36.7 C) 97.8 F (36.6 C) 98.3 F (36.8 C)  TempSrc: Oral Oral Oral Oral  SpO2: 99% 97% 99% 99%  Weight:      Height:        General: Pt is alert, awake, not in acute distress Cardiovascular: RRR, S1/S2 +, no rubs, no gallops Respiratory: CTA bilaterally, no wheezing, no rhonchi Abdominal: Soft, NT, ND, bowel sounds + Extremities: no edema, no cyanosis    The results of significant diagnostics from this hospitalization (including imaging, microbiology, ancillary and laboratory) are listed below for reference.     Microbiology: Recent Results (from the past 240 hour(s))  C  difficile quick scan w PCR reflex     Status: Abnormal   Collection Time: 03/29/17  8:04 PM  Result Value Ref Range Status   C Diff antigen POSITIVE (A) NEGATIVE Final   C Diff toxin NEGATIVE NEGATIVE Final   C Diff interpretation Results are indeterminate. See PCR results.  Final     Labs: BNP (last 3 results) No results for input(s): BNP in the last 8760 hours. Basic Metabolic Panel:  Recent Labs Lab 03/29/17 1549 03/30/17 0555 03/31/17 1035 04/01/17 0517  NA 133* 134* 138 138  K 3.2* 2.9* 3.2* 2.8*  CL 97* 96* 103 103  CO2  25 23 26 27   GLUCOSE 87 82 91 94  BUN 20 19 12 12   CREATININE 1.65* 1.52* 1.19 1.24  CALCIUM 8.3* 8.3* 8.3* 8.6*   Liver Function Tests:  Recent Labs Lab 03/29/17 1549  AST 17  ALT 13*  ALKPHOS 56  BILITOT 0.8  PROT 7.5  ALBUMIN 3.7    Recent Labs Lab 03/29/17 1549  LIPASE 24   No results for input(s): AMMONIA in the last 168 hours. CBC:  Recent Labs Lab 03/29/17 1549 03/30/17 0555 03/31/17 1035  WBC 24.3* 18.2* 9.8  HGB 16.0 14.1 13.1  HCT 45.0 40.1 37.3*  MCV 85.6 83.7 84.8  PLT 323 269 307   Cardiac Enzymes: No results for input(s): CKTOTAL, CKMB, CKMBINDEX, TROPONINI in the last 168 hours. BNP: Invalid input(s): POCBNP CBG: No results for input(s): GLUCAP in the last 168 hours. D-Dimer No results for input(s): DDIMER in the last 72 hours. Hgb A1c No results for input(s): HGBA1C in the last 72 hours. Lipid Profile No results for input(s): CHOL, HDL, LDLCALC, TRIG, CHOLHDL, LDLDIRECT in the last 72 hours. Thyroid function studies No results for input(s): TSH, T4TOTAL, T3FREE, THYROIDAB in the last 72 hours.  Invalid input(s): FREET3 Anemia work up No results for input(s): VITAMINB12, FOLATE, FERRITIN, TIBC, IRON, RETICCTPCT in the last 72 hours. Urinalysis    Component Value Date/Time   COLORURINE YELLOW 03/29/2017 1922   APPEARANCEUR HAZY (A) 03/29/2017 1922   LABSPEC 1.024 03/29/2017 1922   PHURINE 5.0 03/29/2017 1922   GLUCOSEU NEGATIVE 03/29/2017 1922   HGBUR SMALL (A) 03/29/2017 [place] NEGATIVE 03/29/2017 1922   KETONESUR 20 (A) 03/29/2017 1922   PROTEINUR 100 (A) 03/29/2017 1922   NITRITE NEGATIVE 03/29/2017 1922   LEUKOCYTESUR NEGATIVE 03/29/2017 1922   Sepsis Labs Invalid input(s): PROCALCITONIN,  WBC,  LACTICIDVEN Microbiology Recent Results (from the past 240 hour(s))  C difficile quick scan w PCR reflex     Status: Abnormal   Collection Time: 03/29/17  8:04 PM  Result Value Ref Range Status   C Diff antigen  POSITIVE (A) NEGATIVE Final   C Diff toxin NEGATIVE NEGATIVE Final   C Diff interpretation Results are indeterminate. See PCR results.  Final     Time coordinating discharge: 25 minutes  SIGNED:  Marzetta Board, MD  [facility] 04/01/2017, 1:05 PM Pager [phone]  If 7PM-7AM, please contact night-coverage [email or web address] Password [email or web address]

## 2017-04-01 NOTE — Progress Notes (Signed)
Reviewed AVS and discharge summary w/pt. Pt verbalized understanding and questions were answered to his satisfaction. Pt discharged home in stable condition w/pain controlled and all belongings via ambulation.

## 2017-04-01 NOTE — Discharge Instructions (Signed)
Follow with PCP in 1 week. Have your renal function and potassium levels rechecked   Please request your Primary MD to go over all Hospital Tests and Procedure/Radiological results at the follow up, please get all Hospital records sent to your Prim MD by signing hospital release before you go home.  If you had Pneumonia of Lung problems at the Hospital: Please get a 2 view Chest X ray done in 6-8 weeks after hospital discharge or sooner if instructed by your Primary MD.  If you have Congestive Heart Failure: Please call your Cardiologist or Primary MD anytime you have any of the following symptoms:  1) 3 pound weight gain in 24 hours or 5 pounds in 1 week  2) shortness of breath, with or without a dry hacking cough  3) swelling in the hands, feet or stomach  4) if you have to sleep on extra pillows at night in order to breathe  Follow cardiac low salt diet and 1.5 lit/day fluid restriction.  If you have diabetes Accuchecks 4 times/day, Once in AM empty stomach and then before each meal. Log in all results and show them to your primary doctor at your next visit. If any glucose reading is under 80 or above 300 call your primary MD immediately.  If you have Seizure/Convulsions/Epilepsy: Please do not drive, operate heavy machinery, participate in activities at heights or participate in high speed sports until you have seen by Primary MD or a Neurologist and advised to do so again.  If you had Gastrointestinal Bleeding: Please ask your Primary MD to check a complete blood count within one week of discharge or at your next visit. Your endoscopic/colonoscopic biopsies that are pending at the time of discharge, will also need to followed by your Primary MD.  Get Medicines reviewed and adjusted. Please take all your medications with you for your next visit with your Primary MD  Please request your Primary MD to go over all hospital tests and procedure/radiological results at the follow up,  please ask your Primary MD to get all Hospital records sent to his/her office.  If you experience worsening of your admission symptoms, develop shortness of breath, life threatening emergency, suicidal or homicidal thoughts you must seek medical attention immediately by calling 911 or calling your MD immediately  if symptoms less severe.  You must read complete instructions/literature along with all the possible adverse reactions/side effects for all the Medicines you take and that have been prescribed to you. Take any new Medicines after you have completely understood and accpet all the possible adverse reactions/side effects.   Do not drive or operate heavy machinery when taking Pain medications.   Do not take more than prescribed Pain, Sleep and Anxiety Medications  Special Instructions: If you have smoked or chewed Tobacco  in the last 2 yrs please stop smoking, stop any regular Alcohol  and or any Recreational drug use.  Wear Seat belts while driving.  Please note You were cared for by a hospitalist during your hospital stay. If you have any questions about your discharge medications or the care you received while you were in the hospital after you are discharged, you can call the unit and asked to speak with the hospitalist on call if the hospitalist that took care of you is not available. Once you are discharged, your primary care physician will handle any further medical issues. Please note that NO REFILLS for any discharge medications will be authorized once you are discharged, as it  is imperative that you return to your primary care physician (or establish a relationship with a primary care physician if you do not have one) for your aftercare needs so that they can reassess your need for medications and monitor your lab values.  You can reach the hospitalist office at phone [phone] or fax [phone]   If you do not have a primary care physician, you can call [phone] for a  physician referral.  Activity: As tolerated with Full fall precautions use walker/cane & assistance as needed  Diet: regular  Disposition Home

## 2017-04-03 ENCOUNTER — Ambulatory Visit: Payer: Managed Care, Other (non HMO)

## 2017-04-03 DIAGNOSIS — R2689 Other abnormalities of gait and mobility: Secondary | ICD-10-CM

## 2017-04-03 DIAGNOSIS — M6281 Muscle weakness (generalized): Secondary | ICD-10-CM

## 2017-04-03 NOTE — Therapy (Signed)
[facility] [address] [place], [place], [address] Phone: [phone]   Fax:  [phone]  Physical Therapy Treatment  Patient Details  Name: Jeffery Cobb MRN: [id number] Date of Birth: Jun 03, 1956 Referring Provider: Dr. Lockie Mola  Encounter Date: 04/03/2017      PT End of Session - 04/03/17 0933    Visit Number 3   Date for PT Re-Evaluation 05/16/17   PT Start Time 0847   PT Stop Time 0929   PT Time Calculation (min) 42 min   Activity Tolerance Patient tolerated treatment well   Behavior During Therapy [facility] for tasks assessed/performed      Past Medical History:  Diagnosis Date  . Cancer ([place])    lung  . Depression     Past Surgical History:  Procedure Laterality Date  . JOINT REPLACEMENT Left 6.20/2018   hip    There were no vitals filed for this visit.      Subjective Assessment - 04/03/17 0852    Subjective Pt was in the hospital for 3 days due to c-diff.  Pt has fatigue.     Patient Stated Goals strengthen the left hip and improve walking   Currently in Pain? No/denies                         [facility] Adult PT Treatment/Exercise - 04/03/17 0001      Knee/Hip Exercises: Aerobic   Stationary Bike level 2x 8 minutes  PT present to discuss progress     Knee/Hip Exercises: Machines for Strengthening   Total Gym Leg Press 50# bil 3x10, Lt only, 15# Lt 3x10  seat 8     Knee/Hip Exercises: Standing   Knee Flexion Strengthening;Left;3 sets;10 reps   Knee Flexion Limitations --  3# keeping hip in neutral   Rocker Board 3 minutes   Rebounder 3 ways 1 min each   Other Standing Knee Exercises stand on flat side of BOSU ball and keep balance then 15 mini squats keeping pelvis leveled     Knee/Hip Exercises: Seated   Long Arc Quad Strengthening;Left;3 sets;10 reps;Weights   Long Arc Quad Weight 3 lbs.                  PT Short Term Goals - 04/03/17 8101       PT SHORT TERM GOAL #2   Title able to recite left total hip precautions without verbal cues   Time 4   Period Weeks   Status On-going     PT SHORT TERM GOAL #3   Title understand how to use a cane in right hand due to left knee buckling with walking   Baseline pt not using a cane, hyperextension of Lt knee with standing   Time 4   Period Weeks   Status On-going           PT Long Term Goals - 03/21/17 1453      PT LONG TERM GOAL #1   Title independent with HEP and how to progress   Time 8   Period Weeks   Status New   Target Date 05/16/17     PT LONG TERM GOAL #2   Title understand how to exercise at the gym safely  while protecting the left hip    Time 8   Period Weeks   Status New   Target Date 04/18/17     PT LONG TERM GOAL #3  Title walk with no left hip adduction due to left hip strength >/= 4/5   Time 8   Period Weeks   Status New   Target Date 04/18/17     PT LONG TERM GOAL #4   Title ability to walk in the store for 30-45 minutes due to increased endurance and left hip strength without a cane   Time 8   Period Weeks   Status New   Target Date 04/18/17               Plan - 04/03/17 0900    Clinical Impression Statement Pt with lapse in treatment due to hospitilization due to bacterial infection.  Pt with fatigue today due to being in hospital.  Pt not using assistive device.  Pt with Lt knee hyperextension with all standing activity.  PT provided verbal cues to reduce hyperextension with exercise.  Pt able to verbalize all Lt hip precautions.  Pt will continue to benefit from skilled PT for LE strength, endurance and gait.     Rehab Potential Good   Clinical Impairments Affecting Rehab Potential s/p left total replacement posteriorly with precautions of no crossing leg, no IR, and no hip flexion past 90 degrees   PT Frequency 1x / week   PT Duration 8 weeks   PT Treatment/Interventions Therapeutic activities;Therapeutic  exercise;Neuromuscular re-education;Patient/family education;Scar mobilization;Manual techniques;Energy conservation;Gait training   PT Next Visit Plan scar tissue massage and education; left hip and knee strength;   Consulted and Agree with Plan of Care Patient      Patient will benefit from skilled therapeutic intervention in order to improve the following deficits and impairments:  Decreased mobility, Decreased strength, Difficulty walking, Decreased range of motion, Decreased endurance  Visit Diagnosis: Muscle weakness (generalized)  Other abnormalities of gait and mobility     Problem List Patient Active Problem List   Diagnosis Date Noted  . Pancolitis ([place]) 03/29/2017  . C. difficile colitis 03/29/2017  . H/O: lung cancer 03/29/2017  . ICH (intracerebral hemorrhage) ([place]) 07/14/2016  . Portal vein thrombosis 07/14/2016  . Short-term memory loss 07/14/2016  . Brain metastases ([place]) 07/14/2016  . Metastatic lung cancer (metastasis from lung to other site) ([place]) 07/13/2016  . Brain metastasis ([place]) 07/13/2016     Sigurd Sos, PT 04/03/17 9:35 AM  [facility] [address] [place], [place], [address] Phone: [phone]   Fax:  [phone]  Name: Wayde Gopaul MRN: [id number] Date of Birth: 1956-03-29

## 2017-04-11 ENCOUNTER — Ambulatory Visit: Payer: Managed Care, Other (non HMO) | Admitting: Physical Therapy

## 2017-04-11 ENCOUNTER — Encounter: Payer: Self-pay | Admitting: Physical Therapy

## 2017-04-11 DIAGNOSIS — M6281 Muscle weakness (generalized): Secondary | ICD-10-CM

## 2017-04-11 DIAGNOSIS — R2689 Other abnormalities of gait and mobility: Secondary | ICD-10-CM

## 2017-04-11 NOTE — Therapy (Signed)
[facility] [address] [place], [place], [address] Phone: [phone]   Fax:  [phone]  Physical Therapy Treatment  Patient Details  Name: Jeffery Cobb MRN: [id number] Date of Birth: June 14, 1956 Referring Provider: Dr. Lockie Mola  Encounter Date: 04/11/2017      PT End of Session - 04/11/17 0929    Visit Number 4   Date for PT Re-Evaluation 05/16/17   Authorization Type [employer]   PT Start Time 0847   PT Stop Time 0926   PT Time Calculation (min) 39 min   Activity Tolerance Patient tolerated treatment well   Behavior During Therapy [facility] for tasks assessed/performed      Past Medical History:  Diagnosis Date  . Cancer ([place])    lung  . Depression     Past Surgical History:  Procedure Laterality Date  . JOINT REPLACEMENT Left 6.20/2018   hip    There were no vitals filed for this visit.      Subjective Assessment - 04/11/17 0851    Subjective I have been into the hospital for 2 sessions and on an oral antibiotic.  I have been using my TENS unit on the left thigh and back which is helping my pain.  The problem I have is leg extensions.    Patient Stated Goals strengthen the left hip and improve walking   Currently in Pain? No/denies                         [facility] Adult PT Treatment/Exercise - 04/11/17 0001      Knee/Hip Exercises: Aerobic   Nustep level 3 ; 8 min; seat #13; no arms     Knee/Hip Exercises: Standing   Rebounder 3 ways 1 min each                PT Education - 04/11/17 0928    Education provided Yes   Education Details hip  and knee strengthening   Person(s) Educated Patient   Methods Explanation;Demonstration;Verbal cues;Handout   Comprehension Returned demonstration;Verbalized understanding          PT Short Term Goals - 04/11/17 0853      PT SHORT TERM GOAL #1   Title independent with intial HEP   Time 4   Period Weeks   Status  Achieved     PT SHORT TERM GOAL #2   Title able to recite left total hip precautions without verbal cues   Time 4   Period Weeks   Status Achieved           PT Long Term Goals - 03/21/17 1453      PT LONG TERM GOAL #1   Title independent with HEP and how to progress   Time 8   Period Weeks   Status New   Target Date 05/16/17     PT LONG TERM GOAL #2   Title understand how to exercise at the gym safely  while protecting the left hip    Time 8   Period Weeks   Status New   Target Date 04/18/17     PT LONG TERM GOAL #3   Title walk with no left hip adduction due to left hip strength >/= 4/5   Time 8   Period Weeks   Status New   Target Date 04/18/17     PT LONG TERM GOAL #4   Title ability to walk in the store for 30-45  minutes due to increased endurance and left hip strength without a cane   Time 8   Period Weeks   Status New   Target Date 04/18/17               Plan - 04/11/17 0930    Clinical Impression Statement Patient is exercising 4 times per week at the gym.  Patient is using the pool for exercise using the arms and now will start using his legs.  Patient has to change how he exercises the hip and knees due to weakness.  Patient will benefit from skilled therapy for LE strength, endurance and gait.    Rehab Potential Good   Clinical Impairments Affecting Rehab Potential s/p left total replacement posteriorly with precautions of no crossing leg, no IR, and no hip flexion past 90 degrees   PT Frequency 1x / week   PT Duration 8 weeks   PT Treatment/Interventions Therapeutic activities;Therapeutic exercise;Neuromuscular re-education;Patient/family education;Scar mobilization;Manual techniques;Energy conservation;Gait training   PT Next Visit Plan scar tissue massage and education; left hip and knee strength;   PT Home Exercise Plan progress as needed   Consulted and Agree with Plan of Care Patient      Patient will benefit from skilled therapeutic  intervention in order to improve the following deficits and impairments:  Decreased mobility, Decreased strength, Difficulty walking, Decreased range of motion, Decreased endurance  Visit Diagnosis: Muscle weakness (generalized)  Other abnormalities of gait and mobility     Problem List Patient Active Problem List   Diagnosis Date Noted  . Pancolitis ([place]) 03/29/2017  . C. difficile colitis 03/29/2017  . H/O: lung cancer 03/29/2017  . ICH (intracerebral hemorrhage) ([place]) 07/14/2016  . Portal vein thrombosis 07/14/2016  . Short-term memory loss 07/14/2016  . Brain metastases ([place]) 07/14/2016  . Metastatic lung cancer (metastasis from lung to other site) ([place]) 07/13/2016  . Brain metastasis ([place]) 07/13/2016    Earlie Counts, PT 04/11/17 9:33 AM   [facility] [address], [place], [place], [address] Phone: [phone]   Fax:  [phone]  Name: Jeffery Cobb MRN: [id number] Date of Birth: [birth year]

## 2017-04-11 NOTE — Patient Instructions (Addendum)
EXTENSION: Sitting - Resistance Band (Active)    Sit with feet flat. Against red resistance band that is criss crossed around foot, straighten right knee. Complete _3__ sets of _10__ repetitions. Perform __1_ sessions per day.  Copyright  VHI. All rights reserved.  FLEXION: Sitting - Resistance Band (Active)    Sit with right leg extended. Against yellow resistance band, bend knee and draw foot backward. Complete _3__ sets of _10__ repetitions. Perform _1__ sessions per day.  http://gtsc.exer.us/230   Copyright  VHI. All rights reserved.    There are multiple ways to do this exercise.  In this demonstration the subject used a stability ball.  You can also do this exercise leaning up against a table, wall, or in a plank position.  Using a stability ball, place the ball on the ground and lay on top of it with your stomach.  To balance, place hands on the ground.  Keep both legs straight.  Next, raise on leg towards the ceiling until you cannot go any farther.  Then come back down in a slow controlled movement.  Alternate legs, and be sure to keep them straight throughout the entire exercise.  To increase intensity add cuff weights around the ankles.      While lying face down over a ball, support your self with your feet and hands. Next, slowly raise up one arm and opposite leg.   Return arm and leg back to floor and then raise up the opposite arm/leg.    While lying on the floor, place an exercise ball under your lower legs and then raise up your buttocks.    While lying on the floor, place an exercise ball under your lower legs and then raise up your buttocks.   While holding this position raise a leg up off the ball towards the ceiling then lower back to the ball. Alternate this to the other leg and repeat.    Start Position:   Heels on ball, knees at 90 degrees  End Position:   Raise your bottom up while stabilizing your core  Do not let your back  arch!  [facility] [address], [place], [place] [address] Phone # [phone] Fax [phone]

## 2017-04-17 ENCOUNTER — Ambulatory Visit: Payer: Managed Care, Other (non HMO) | Admitting: Physical Therapy

## 2017-04-17 ENCOUNTER — Encounter: Payer: Self-pay | Admitting: Physical Therapy

## 2017-04-17 DIAGNOSIS — R2689 Other abnormalities of gait and mobility: Secondary | ICD-10-CM

## 2017-04-17 DIAGNOSIS — M6281 Muscle weakness (generalized): Secondary | ICD-10-CM

## 2017-04-17 NOTE — Therapy (Signed)
[facility] [address], [place], [place], [address] Phone: [phone]   Fax:  [phone]  Physical Therapy Treatment  Patient Details  Name: Jeffery Cobb MRN: [id number] Date of Birth: [birth year] Referring Provider: Dr. Lockie Mola  Encounter Date: 04/17/2017      PT End of Session - 04/17/17 0835    Visit Number 4   Date for PT Re-Evaluation 05/16/17   Authorization Type [employer]   PT Start Time 0800   PT Stop Time 0838   PT Time Calculation (min) 38 min   Activity Tolerance Patient tolerated treatment well   Behavior During Therapy [facility] for tasks assessed/performed      Past Medical History:  Diagnosis Date  . Cancer ([place])    lung  . Depression     Past Surgical History:  Procedure Laterality Date  . JOINT REPLACEMENT Left 6.20/2018   hip    There were no vitals filed for this visit.      Subjective Assessment - 04/17/17 0805    Subjective I saw my orthopedist and he said I am doing well. I am using my legs more in the pool and building up my endurance. I used to do 70 laps and now on 20 laps.    Patient Stated Goals strengthen the left hip and improve walking   Currently in Pain? No/denies                         [facility] Adult PT Treatment/Exercise - 04/17/17 0001      Knee/Hip Exercises: Aerobic   Stationary Bike level 4x 8 minutes  PT present to discuss progress   Elliptical level 2x 4 min going back and forth every minute     Knee/Hip Exercises: Standing   Hip Abduction Left;1 set;Knee straight   Abduction Limitations leading with heel; red theraband   Hip Extension 1 set;Left;Stengthening;20 reps   Extension Limitations contract buttocks before extending; red band   Lateral Step Up 20 reps;Hand Hold: 2   Lateral Step Up Limitations on round side of BOSU ball   Forward Step Up 15 reps;Hand Hold: 1   Forward Step Up Limitations on round side of BOSU ball   Step  Down 1 set;20 reps;Hand Hold: 2   Step Down Limitations on 6 inch step   Rebounder 3 ways 1 min each   Other Standing Knee Exercises stand on flat side of BOSU ball holding the yellow plyoball and move forward/backward, sideways, diagonals                  PT Short Term Goals - 04/17/17 2094      PT SHORT TERM GOAL #3   Title understand how to use a cane in right hand due to left knee buckling with walking   Baseline pt not using a cane, hyperextension of Lt knee with standing   Time 4   Period Weeks   Status Achieved           PT Long Term Goals - 03/21/17 1453      PT LONG TERM GOAL #1   Title independent with HEP and how to progress   Time 8   Period Weeks   Status New   Target Date 05/16/17     PT LONG TERM GOAL #2   Title understand how to exercise at the gym safely  while protecting the left hip    Time 8  Period Weeks   Status New   Target Date 04/18/17     PT LONG TERM GOAL #3   Title walk with no left hip adduction due to left hip strength >/= 4/5   Time 8   Period Weeks   Status New   Target Date 04/18/17     PT LONG TERM GOAL #4   Title ability to walk in the store for 30-45 minutes due to increased endurance and left hip strength without a cane   Time 8   Period Weeks   Status New   Target Date 04/18/17               Plan - 04/17/17 0809    Clinical Impression Statement Patient is able to walk with his left knee not buckling as much. Patient has learned exercises that are more challenging.  Patient has weakness with hip abduction.  Patient is able to use his legs more in the pool . Patient will benefit from skilled therapy from skilled therapy for LE strength, endurance and gait.    Rehab Potential Good   Clinical Impairments Affecting Rehab Potential s/p left total replacement posteriorly with precautions of no crossing leg, no IR, and no hip flexion past 90 degrees   PT Frequency 1x / week   PT Duration 8 weeks   PT  Treatment/Interventions Therapeutic activities;Therapeutic exercise;Neuromuscular re-education;Patient/family education;Scar mobilization;Manual techniques;Energy conservation;Gait training   PT Next Visit Plan  left hip and knee strength;   PT Home Exercise Plan progress as needed   Consulted and Agree with Plan of Care Patient      Patient will benefit from skilled therapeutic intervention in order to improve the following deficits and impairments:  Decreased mobility, Decreased strength, Difficulty walking, Decreased range of motion, Decreased endurance, Impaired sensation  Visit Diagnosis: Muscle weakness (generalized)  Other abnormalities of gait and mobility     Problem List Patient Active Problem List   Diagnosis Date Noted  . Pancolitis ([place]) 03/29/2017  . C. difficile colitis 03/29/2017  . H/O: lung cancer 03/29/2017  . ICH (intracerebral hemorrhage) ([place]) 07/14/2016  . Portal vein thrombosis 07/14/2016  . Short-term memory loss 07/14/2016  . Brain metastases ([place]) 07/14/2016  . Metastatic lung cancer (metastasis from lung to other site) ([place]) 07/13/2016  . Brain metastasis ([place]) 07/13/2016    Earlie Counts, PT 04/17/17 8:41 AM   [facility] [address] [place], [place], [address] Phone: [phone]   Fax:  [phone]  Name: Jeffery Cobb MRN: [id number] Date of Birth: March 21, 1956

## 2017-04-26 ENCOUNTER — Encounter: Payer: Self-pay | Admitting: Physical Therapy

## 2017-04-26 ENCOUNTER — Ambulatory Visit: Payer: Managed Care, Other (non HMO) | Attending: Orthopedic Surgery | Admitting: Physical Therapy

## 2017-04-26 DIAGNOSIS — M6281 Muscle weakness (generalized): Secondary | ICD-10-CM | POA: Diagnosis present

## 2017-04-26 DIAGNOSIS — R2689 Other abnormalities of gait and mobility: Secondary | ICD-10-CM | POA: Diagnosis present

## 2017-04-26 NOTE — Therapy (Signed)
[facility] [address] [place], [place], [address] Phone: [phone]   Fax:  [phone]  Physical Therapy Treatment  Patient Details  Name: Jeffery Cobb MRN: [id number] Date of Birth: 1956/07/13 Referring Provider: Dr. Lockie Mola  Encounter Date: 04/26/2017      PT End of Session - 04/26/17 1027    Visit Number 5   Date for PT Re-Evaluation 05/16/17   Authorization Type [employer]   PT Start Time 1020  came late   PT Stop Time 1058   PT Time Calculation (min) 38 min   Activity Tolerance Patient tolerated treatment well   Behavior During Therapy [facility] for tasks assessed/performed      Past Medical History:  Diagnosis Date  . Cancer ([place])    lung  . Depression     Past Surgical History:  Procedure Laterality Date  . JOINT REPLACEMENT Left 6.20/2018   hip    There were no vitals filed for this visit.      Subjective Assessment - 04/26/17 1026    Subjective I had a CT scan and everything is good.  My hip is a little tight. I start traveling in an airplane. It will be alot of walking and renting cars.     Patient Stated Goals strengthen the left hip and improve walking   Currently in Pain? No/denies   Multiple Pain Sites No                         [facility] Adult PT Treatment/Exercise - 04/26/17 0001      Knee/Hip Exercises: Aerobic   Stationary Bike level 4x 8 minutes  PT present to discuss progress   Elliptical level 2x 4 min going back and forth every minute     Knee/Hip Exercises: Machines for Strengthening   Total Gym Leg Press 50# bil 3x10, Lt only, 30# Lt 3x10  seat 8     Knee/Hip Exercises: Standing   Hip Abduction Left;1 set;Knee straight   Abduction Limitations leading with heel; red theraband   Hip Extension 1 set;Left;Stengthening;20 reps   Extension Limitations contract buttocks before extending; red band   Lateral Step Up 20 reps;Hand Hold: 2   Lateral Step  Up Limitations on round side of BOSU ball   Forward Step Up Left;20 reps;Hand Hold: 1   Forward Step Up Limitations on round side of BOSU ball   Step Down 1 set;20 reps;Hand Hold: 2   Step Down Limitations on 6 inch step   Other Standing Knee Exercises stand on flat side of BOSU ball holding the yellow plyoball and move forward/backward, sideways, diagonals                  PT Short Term Goals - 04/17/17 3875      PT SHORT TERM GOAL #3   Title understand how to use a cane in right hand due to left knee buckling with walking   Baseline pt not using a cane, hyperextension of Lt knee with standing   Time 4   Period Weeks   Status Achieved           PT Long Term Goals - 04/26/17 1057      PT LONG TERM GOAL #1   Title independent with HEP and how to progress   Time 8   Period Weeks   Status On-going     PT LONG TERM GOAL #2   Title understand how  to exercise at the gym safely  while protecting the left hip    Time 8   Period Weeks   Status On-going     PT LONG TERM GOAL #3   Title walk with no left hip adduction due to left hip strength >/= 4/5   Time 8   Period Weeks   Status On-going     PT LONG TERM GOAL #4   Title ability to walk in the store for 30-45 minutes due to increased endurance and left hip strength without a cane   Time 8   Period Weeks   Status On-going               Plan - 04/26/17 1052    Clinical Impression Statement Patient has traveled for the first time this weekend. It was a challenge with all of the walking, pulling the luggage and getting in and out of different cars.  Buckling of left knee is 90% better.  I was able to do the escalators.  I have trouble when I do not lift my left heel up as much I scouff it on the floor. Patient will benefit from skilled therapy for LE strength, endurance and gait.    Rehab Potential Good   Clinical Impairments Affecting Rehab Potential s/p left total replacement posteriorly with precautions  of no crossing leg, no IR, and no hip flexion past 90 degrees   PT Frequency 1x / week   PT Duration 8 weeks   PT Treatment/Interventions Therapeutic activities;Therapeutic exercise;Neuromuscular re-education;Patient/family education;Scar mobilization;Manual techniques;Energy conservation;Gait training   PT Next Visit Plan  left hip and knee strength;   PT Home Exercise Plan progress as needed   Consulted and Agree with Plan of Care Patient      Patient will benefit from skilled therapeutic intervention in order to improve the following deficits and impairments:  Decreased mobility, Decreased strength, Difficulty walking, Decreased range of motion, Decreased endurance, Impaired sensation  Visit Diagnosis: Muscle weakness (generalized)  Other abnormalities of gait and mobility     Problem List Patient Active Problem List   Diagnosis Date Noted  . Pancolitis ([place]) 03/29/2017  . C. difficile colitis 03/29/2017  . H/O: lung cancer 03/29/2017  . ICH (intracerebral hemorrhage) ([place]) 07/14/2016  . Portal vein thrombosis 07/14/2016  . Short-term memory loss 07/14/2016  . Brain metastases ([place]) 07/14/2016  . Metastatic lung cancer (metastasis from lung to other site) ([place]) 07/13/2016  . Brain metastasis ([place]) 07/13/2016    Earlie Counts, PT 04/26/17 10:58 AM   [facility] [address] [place], [place], [address] Phone: [phone]   Fax:  [phone]  Name: Jeffery Cobb MRN: [id number] Date of Birth: 04/01/1956

## 2017-04-30 ENCOUNTER — Ambulatory Visit: Payer: Managed Care, Other (non HMO) | Admitting: Physical Therapy

## 2017-04-30 ENCOUNTER — Encounter: Payer: Self-pay | Admitting: Physical Therapy

## 2017-04-30 DIAGNOSIS — M6281 Muscle weakness (generalized): Secondary | ICD-10-CM | POA: Diagnosis not present

## 2017-04-30 DIAGNOSIS — R2689 Other abnormalities of gait and mobility: Secondary | ICD-10-CM

## 2017-04-30 NOTE — Therapy (Signed)
[facility] [address], [place], [place], [address] Phone: [phone]   Fax:  [phone]  Physical Therapy Treatment  Patient Details  Name: Jeffery Cobb MRN: [id number] Date of Birth: 10-Aug-1956 Referring Provider: Dr. Lockie Mola  Encounter Date: 04/30/2017      PT End of Session - 04/30/17 0805    Visit Number 6   Date for PT Re-Evaluation 05/16/17   Authorization Type [employer]   PT Start Time 0800   PT Stop Time 0840   PT Time Calculation (min) 40 min   Activity Tolerance Patient tolerated treatment well   Behavior During Therapy [facility] for tasks assessed/performed      Past Medical History:  Diagnosis Date  . Cancer ([place])    lung  . Depression     Past Surgical History:  Procedure Laterality Date  . JOINT REPLACEMENT Left 6.20/2018   hip    There were no vitals filed for this visit.      Subjective Assessment - 04/30/17 0805    Subjective I am doing good.    Patient Stated Goals strengthen the left hip and improve walking   Currently in Pain? No/denies                         [facility] Adult PT Treatment/Exercise - 04/30/17 0001      Knee/Hip Exercises: Aerobic   Stationary Bike 7 min; random program; 7 min     Knee/Hip Exercises: Machines for Strengthening   Total Gym Leg Press 60# bil 3x10, Lt only, 30# Lt 3x10  seat 8     Knee/Hip Exercises: Standing   Hip Abduction Left;1 set;Knee straight   Abduction Limitations leading with heel; red theraband   Hip Extension 1 set;Left;Stengthening;20 reps   Extension Limitations contract buttocks before extending; red band   Lateral Step Up 20 reps;Hand Hold: 2   Lateral Step Up Limitations on round side of BOSU ball   Forward Step Up Left;20 reps;Hand Hold: 1   Forward Step Up Limitations on round side of BOSU ball   Step Down 1 set;20 reps;Hand Hold: 2   Step Down Limitations on 6 inch step   Rebounder bouncing on  trampoline 1 min; side to side with bounce 1 min; forward and backward with bounce 1 min each   Other Standing Knee Exercises stand on flat side of BOSU ball holding the yellow plyoball and move forward/backward, sideways, diagonals; stand on falt side with eyes closed keeping balance                  PT Short Term Goals - 04/17/17 0839      PT SHORT TERM GOAL #3   Title understand how to use a cane in right hand due to left knee buckling with walking   Baseline pt not using a cane, hyperextension of Lt knee with standing   Time 4   Period Weeks   Status Achieved           PT Long Term Goals - 04/30/17 0840      PT LONG TERM GOAL #1   Title independent with HEP and how to progress   Time 8   Period Weeks   Status On-going     PT LONG TERM GOAL #2   Title understand how to exercise at the gym safely  while protecting the left hip    Period Weeks   Status Achieved  PT LONG TERM GOAL #3   Title walk with no left hip adduction due to left hip strength >/= 4/5   Time 8   Period Weeks   Status On-going     PT LONG TERM GOAL #4   Title ability to walk in the store for 30-45 minutes due to increased endurance and left hip strength without a cane   Time 8   Period Weeks   Status Achieved               Plan - 04/30/17 0141    Clinical Impression Statement Patient has difficulty with control of left quadriceps. Patient has decreased balance when on flat BOSU with eyes closed. Patient is progressing with his strength.  Patient will benefit from skilled therapy to improve lower extremity strength, endurance and gait.    Rehab Potential Good   Clinical Impairments Affecting Rehab Potential s/p left total replacement posteriorly with precautions of no crossing leg, no IR, and no hip flexion past 90 degrees   PT Frequency 1x / week   PT Duration 8 weeks   PT Treatment/Interventions Therapeutic activities;Therapeutic exercise;Neuromuscular  re-education;Patient/family education;Scar mobilization;Manual techniques;Energy conservation;Gait training   PT Next Visit Plan  left hip and knee strength;Prepare for discharge in 2 weeks   PT Home Exercise Plan progress as needed   Consulted and Agree with Plan of Care Patient      Patient will benefit from skilled therapeutic intervention in order to improve the following deficits and impairments:  Decreased mobility, Decreased strength, Difficulty walking, Decreased range of motion, Decreased endurance, Impaired sensation  Visit Diagnosis: Muscle weakness (generalized)  Other abnormalities of gait and mobility     Problem List Patient Active Problem List   Diagnosis Date Noted  . Pancolitis ([place]) 03/29/2017  . C. difficile colitis 03/29/2017  . H/O: lung cancer 03/29/2017  . ICH (intracerebral hemorrhage) ([place]) 07/14/2016  . Portal vein thrombosis 07/14/2016  . Short-term memory loss 07/14/2016  . Brain metastases ([place]) 07/14/2016  . Metastatic lung cancer (metastasis from lung to other site) ([place]) 07/13/2016  . Brain metastasis ([place]) 07/13/2016    Earlie Counts, PT 04/30/17 8:42 AM   [facility] [address] [place], [place], [address] Phone: [phone]   Fax:  [phone]  Name: Jeffery Cobb MRN: [id number] Date of Birth: 04/14/1956

## 2017-05-07 ENCOUNTER — Encounter: Payer: Managed Care, Other (non HMO) | Admitting: Physical Therapy

## 2017-05-10 ENCOUNTER — Encounter: Payer: Self-pay | Admitting: Physical Therapy

## 2017-05-10 ENCOUNTER — Ambulatory Visit: Payer: Managed Care, Other (non HMO) | Admitting: Physical Therapy

## 2017-05-10 DIAGNOSIS — M6281 Muscle weakness (generalized): Secondary | ICD-10-CM | POA: Diagnosis not present

## 2017-05-10 DIAGNOSIS — R2689 Other abnormalities of gait and mobility: Secondary | ICD-10-CM

## 2017-05-10 NOTE — Therapy (Signed)
[facility] [address], [place], [place], [address] Phone: [phone]   Fax:  [phone]  Physical Therapy Treatment  Patient Details  Name: Jeffery Cobb MRN: [id number] Date of Birth: 12-Jul-1956 Referring Provider: Dr. Lockie Mola  Encounter Date: 05/10/2017      PT End of Session - 05/10/17 0809    Visit Number 7   Date for PT Re-Evaluation 05/16/17   Authorization Type [employer]   PT Start Time 0802   PT Stop Time 0841   PT Time Calculation (min) 39 min   Activity Tolerance Patient tolerated treatment well   Behavior During Therapy [facility] for tasks assessed/performed      Past Medical History:  Diagnosis Date  . Cancer ([place])    lung  . Depression     Past Surgical History:  Procedure Laterality Date  . JOINT REPLACEMENT Left 6.20/2018   hip    There were no vitals filed for this visit.      Subjective Assessment - 05/10/17 0806    Subjective I am doing well.    Patient Stated Goals strengthen the left hip and improve walking   Currently in Pain? No/denies   Multiple Pain Sites No                         [facility] Adult PT Treatment/Exercise - 05/10/17 0001      Knee/Hip Exercises: Machines for Strengthening   Total Gym Leg Press 60# bil 3x10, Lt only, 30# Lt 3x10  seat 8     Knee/Hip Exercises: Standing   Hip Abduction Left;Knee straight;2 sets  stand on foam   Abduction Limitations leading with heel; red theraband   Hip Extension Left;Stengthening;20 reps;2 sets  stand on foam   Extension Limitations contract buttocks before extending; red band   Rebounder stand with feel together eyes closed hold 15 sec x3                  PT Short Term Goals - 04/17/17 6945      PT SHORT TERM GOAL #3   Title understand how to use a cane in right hand due to left knee buckling with walking   Baseline pt not using a cane, hyperextension of Lt knee with standing   Time 4   Period Weeks   Status Achieved           PT Long Term Goals - 05/10/17 0388      PT LONG TERM GOAL #1   Title independent with HEP and how to progress   Time 8   Period Weeks   Status On-going     PT LONG TERM GOAL #2   Title understand how to exercise at the gym safely  while protecting the left hip    Time 8   Period Weeks   Status Achieved     PT LONG TERM GOAL #3   Title walk with no left hip adduction due to left hip strength >/= 4/5   Time 8   Period Weeks   Status On-going     PT LONG TERM GOAL #4   Title ability to walk in the store for 30-45 minutes due to increased endurance and left hip strength without a cane   Time 8   Period Weeks   Status Achieved               Plan - 05/10/17 8280  Clinical Impression Statement Patient is advancing his exercise with changing his surface and eyes closed.  Afterwards, patient felt his muscle tighten more and stronger.  Patient works out regularly to increase strength.  Patient is working toward his goals and ready for discharge next week. Patient will benefit from skilled therapy  to improve lower extremity strength, endurance and gait.    Rehab Potential Good   Clinical Impairments Affecting Rehab Potential s/p left total replacement posteriorly with precautions of no crossing leg, no IR, and no hip flexion past 90 degrees   PT Frequency 1x / week   PT Duration 8 weeks   PT Next Visit Plan  left hip and knee strength;Prepare for discharge in next week.    PT Home Exercise Plan progress as needed   Consulted and Agree with Plan of Care Patient      Patient will benefit from skilled therapeutic intervention in order to improve the following deficits and impairments:  Decreased mobility, Decreased strength, Difficulty walking, Decreased range of motion, Decreased endurance, Impaired sensation  Visit Diagnosis: Muscle weakness (generalized)  Other abnormalities of gait and mobility     Problem  List Patient Active Problem List   Diagnosis Date Noted  . Pancolitis ([place]) 03/29/2017  . C. difficile colitis 03/29/2017  . H/O: lung cancer 03/29/2017  . ICH (intracerebral hemorrhage) ([place]) 07/14/2016  . Portal vein thrombosis 07/14/2016  . Short-term memory loss 07/14/2016  . Brain metastases ([place]) 07/14/2016  . Metastatic lung cancer (metastasis from lung to other site) ([place]) 07/13/2016  . Brain metastasis ([place]) 07/13/2016    Earlie Counts, PT 05/10/17 8:42 AM   [facility] [address] [place], [place], [address] Phone: [phone]   Fax:  [phone]  Name: Jeffery Cobb MRN: [id number] Date of Birth: 1956/06/19

## 2017-05-14 ENCOUNTER — Ambulatory Visit: Payer: Self-pay | Admitting: Physical Therapy

## 2017-05-16 ENCOUNTER — Encounter: Payer: Self-pay | Admitting: Physical Therapy

## 2017-05-20 ENCOUNTER — Encounter: Payer: Self-pay | Admitting: Physical Therapy

## 2017-05-20 ENCOUNTER — Ambulatory Visit: Payer: Managed Care, Other (non HMO) | Attending: Orthopedic Surgery | Admitting: Physical Therapy

## 2017-05-20 DIAGNOSIS — M6281 Muscle weakness (generalized): Secondary | ICD-10-CM

## 2017-05-20 DIAGNOSIS — R2689 Other abnormalities of gait and mobility: Secondary | ICD-10-CM | POA: Diagnosis present

## 2017-05-20 NOTE — Therapy (Signed)
[facility] [address], [place], [place], [address] Phone: [phone]   Fax:  [phone]  Physical Therapy Treatment  Patient Details  Name: Jeffery Cobb MRN: [id number] Date of Birth: 1956-01-05 Referring Provider: Dr. Deloris Ping  Encounter Date: 05/20/2017      PT End of Session - 05/20/17 1626    Visit Number 8   Authorization Type [employer]   PT Start Time 1696   PT Stop Time 7893  left early because we covered everything   PT Time Calculation (min) 26 min   Activity Tolerance Patient tolerated treatment well   Behavior During Therapy [facility] for tasks assessed/performed      Past Medical History:  Diagnosis Date  . Cancer ([place])    lung  . Depression     Past Surgical History:  Procedure Laterality Date  . JOINT REPLACEMENT Left 6.20/2018   hip    There were no vitals filed for this visit.      Subjective Assessment - 05/20/17 1625    Subjective I am feleing good and ready for discharge.    Patient Stated Goals strengthen the left hip and improve walking   Currently in Pain? No/denies            [facility] PT Assessment - 05/20/17 0001      Assessment   Medical Diagnosis posterior total hip arthroplasty   Referring Provider Dr. Deloris Ping   Onset Date/Surgical Date 02/06/17   Prior Therapy yes     Precautions   Precautions Posterior Hip   Precaution Comments cancer precautions; Posterior hip precautions     Restrictions   Weight Bearing Restrictions No     Balance Screen   Has the patient fallen in the past 6 months No   Has the patient had a decrease in activity level because of a fear of falling?  No   Is the patient reluctant to leave their home because of a fear of falling?  No     Home Environment   Living Environment Private residence   Type of [place] Access Stairs to enter   Entrance Stairs-Number of Steps 4   Home Layout Two level     Prior Function   Level of Independence Independent     Cognition   Overall Cognitive Status Within Functional Limits for tasks assessed     AROM   Left Hip ABduction 0     PROM   Left Hip Flexion 90   Left Hip External Rotation  30   Left Hip ABduction 20     Strength   Left Hip Flexion 4+/5   Left Hip Extension 4+/5   Left Hip ABduction 3+/5   Left Knee Extension 4+/5                     [facility] Adult PT Treatment/Exercise - 05/20/17 0001      Exercises   Exercises Other Exercises   Other Exercises  dicussed with patient on progressing his program and going over his exercises at the gym     Knee/Hip Exercises: Aerobic   Elliptical level 2x 4 min going back and forth every minute     Knee/Hip Exercises: Machines for Strengthening   Total Gym Leg Press 60# bil 3x10, Lt only, 30# Lt 3x10  seat 8                  PT Short Term  Goals - 04/17/17 0839      PT SHORT TERM GOAL #3   Title understand how to use a cane in right hand due to left knee buckling with walking   Baseline pt not using a cane, hyperextension of Lt knee with standing   Time 4   Period Weeks   Status Achieved           PT Long Term Goals - 05/20/17 1645      PT LONG TERM GOAL #1   Title independent with HEP and how to progress   Time 8   Period Weeks   Status Achieved     PT LONG TERM GOAL #2   Title understand how to exercise at the gym safely  while protecting the left hip    Time 8   Period Weeks   Status Achieved     PT LONG TERM GOAL #3   Title walk with no left hip adduction due to left hip strength >/= 4/5   Time 8   Period Weeks   Status Achieved     PT LONG TERM GOAL #4   Title ability to walk in the store for 30-45 minutes due to increased endurance and left hip strength without a cane   Time 8   Period Weeks   Status Achieved               Plan - 05/20/17 1644    Clinical Impression Statement Patient has met his goals. Patient has  increased strength in bilateral legs.  Patient is independent with his HEP and understands how to progress himself with the gym program.  Patient has resumed all of his activities.  Patient is ready for discharge.    Rehab Potential Good   Clinical Impairments Affecting Rehab Potential s/p left total replacement posteriorly with precautions of no crossing leg, no IR, and no hip flexion past 90 degrees   PT Treatment/Interventions Therapeutic activities;Therapeutic exercise;Neuromuscular re-education;Patient/family education;Scar mobilization;Manual techniques;Energy conservation;Gait training   PT Next Visit Plan this visit discharge to HEP   PT Home Exercise Plan Current HEP   Consulted and Agree with Plan of Care Patient      Patient will benefit from skilled therapeutic intervention in order to improve the following deficits and impairments:  Decreased mobility, Decreased strength, Difficulty walking, Decreased range of motion, Decreased endurance, Impaired sensation  Visit Diagnosis: Muscle weakness (generalized)  Other abnormalities of gait and mobility     Problem List Patient Active Problem List   Diagnosis Date Noted  . Pancolitis ([place]) 03/29/2017  . C. difficile colitis 03/29/2017  . H/O: lung cancer 03/29/2017  . ICH (intracerebral hemorrhage) ([place]) 07/14/2016  . Portal vein thrombosis 07/14/2016  . Short-term memory loss 07/14/2016  . Brain metastases ([place]) 07/14/2016  . Metastatic lung cancer (metastasis from lung to other site) ([place]) 07/13/2016  . Brain metastasis ([place]) 07/13/2016    Earlie Counts, PT 05/20/17 4:47 PM   [facility] [address], [place], [place], [address] Phone: [phone]   Fax:  [phone]  Name: Jeffery Cobb MRN: [id number] Date of Birth: 08-22-1955  PHYSICAL THERAPY DISCHARGE SUMMARY  Visits from Start of Care: 8  Current functional level related to goals / functional  outcomes: See above. Met all of his goals   Remaining deficits: See above   Education / Equipment: HEP  Plan: Patient agrees to discharge.  Patient goals were met. Patient is being discharged due to meeting the  stated rehab goals.  Thank you for the referral. Earlie Counts, PT 05/20/17 4:47 PM  ?????

## 2017-12-29 DIAGNOSIS — I609 Nontraumatic subarachnoid hemorrhage, unspecified: Secondary | ICD-10-CM | POA: Insufficient documentation

## 2019-03-08 IMAGING — CT CT ABD-PELV W/ CM
2 of 5 series · 15 of 46 positions shown, 17 images · IV contrast (ISOVUE)
Comparison: 01/01/2013 CT

CLINICAL DATA: Abdominal pain and diarrhea since [REDACTED]. Recent
course of antibiotics. Leukocytosis. History of metastatic lung
cancer with brain metastasis.

EXAM:
CT ABDOMEN AND PELVIS WITH CONTRAST
TECHNIQUE: Multidetector CT imaging of the abdomen and pelvis was performed
using the standard protocol following bolus administration of
intravenous contrast.
CONTRAST:  80mL [id number] IOPAMIDOL ([id number]) INJECTION 61%

[Series 2: abd/pel with · axial · 0.88mm/px · z∈[+1105,+1520]mm · 12 of 95 slices shown, 14 images]
[im 6/95  soft-tissue]
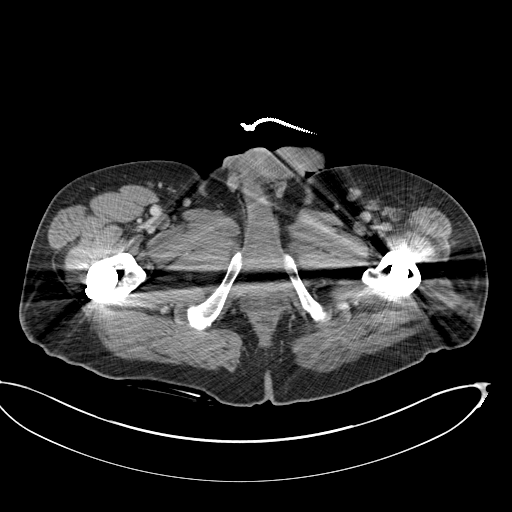
[im 6/95  bone]
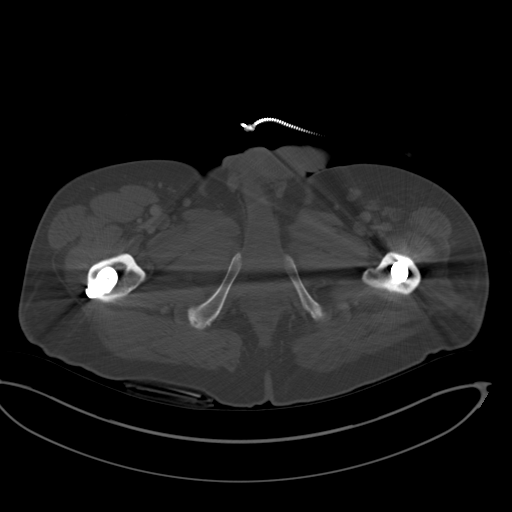
[im 17/95  soft-tissue]
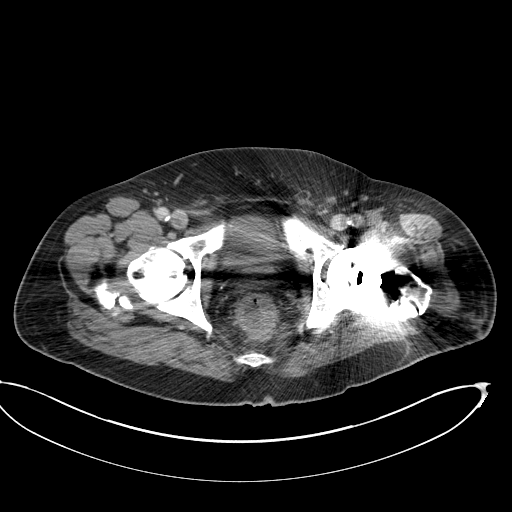
[im 23/95  soft-tissue]
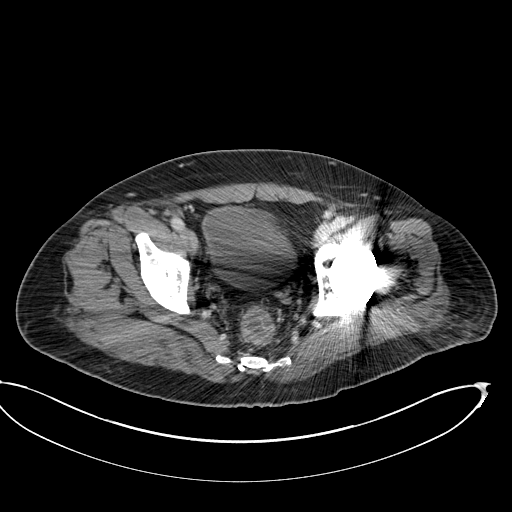
[im 28/95  soft-tissue]
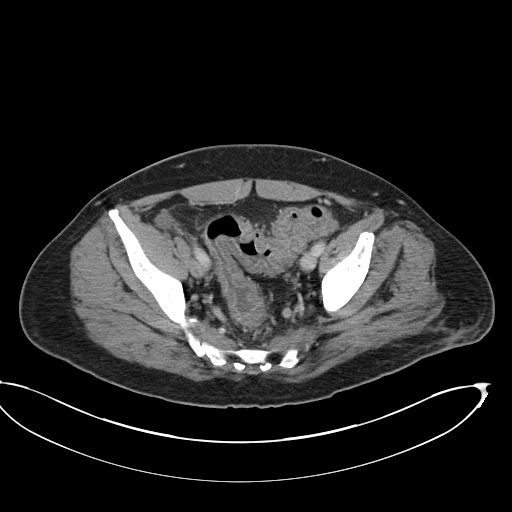
[im 39/95  soft-tissue]
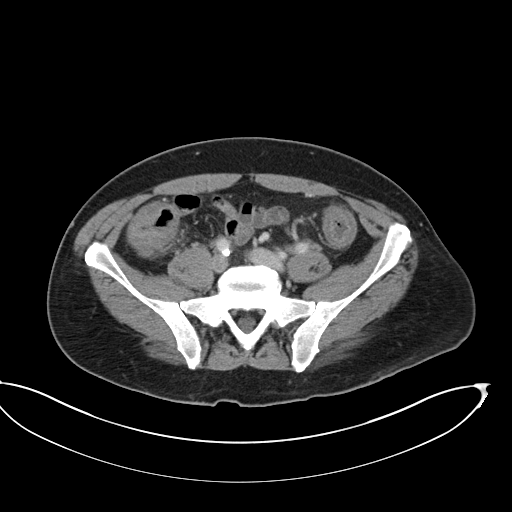
[im 45/95  soft-tissue]
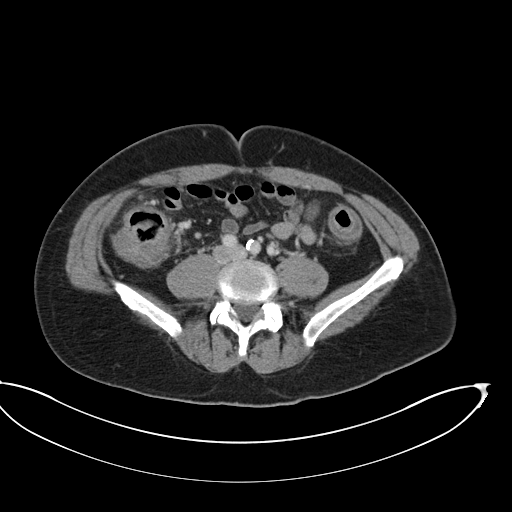
[im 50/95  soft-tissue]
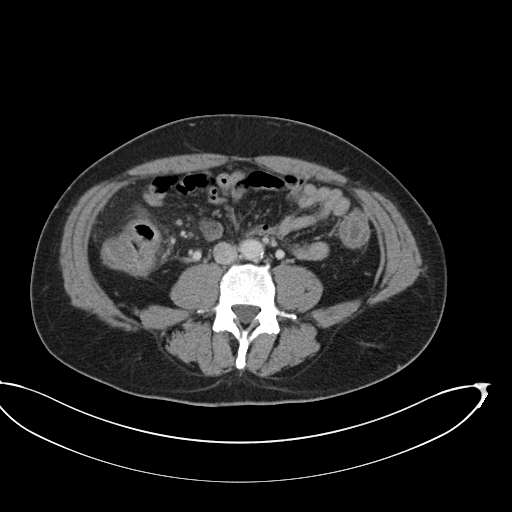
[im 61/95  soft-tissue]
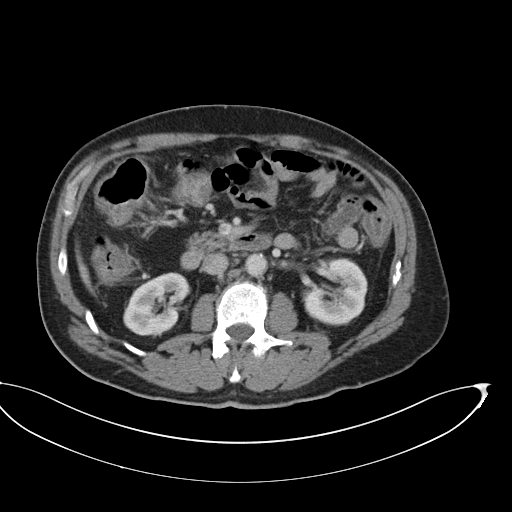
[im 67/95  soft-tissue]
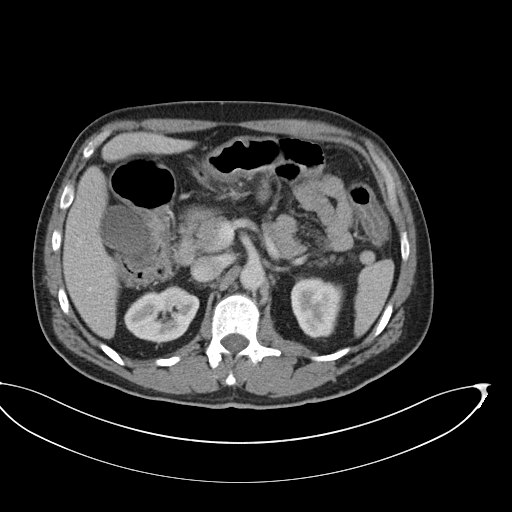
[im 67/95  bone]
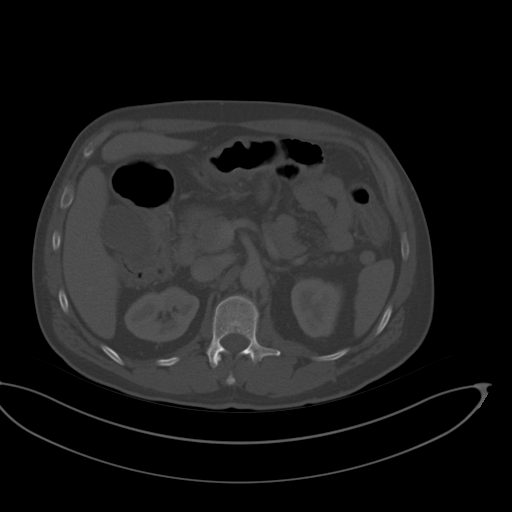
[im 72/95  soft-tissue]
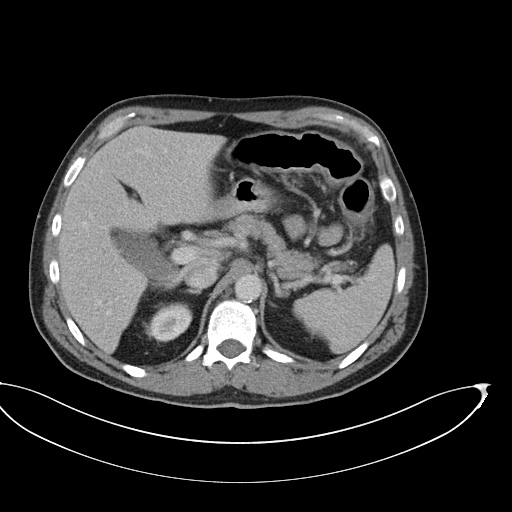
[im 83/95  soft-tissue]
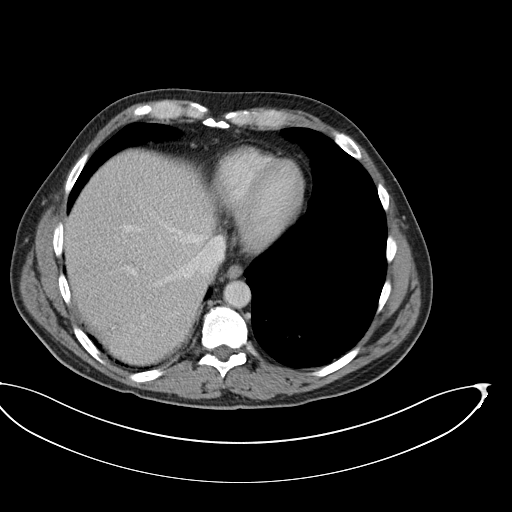
[im 89/95  soft-tissue]
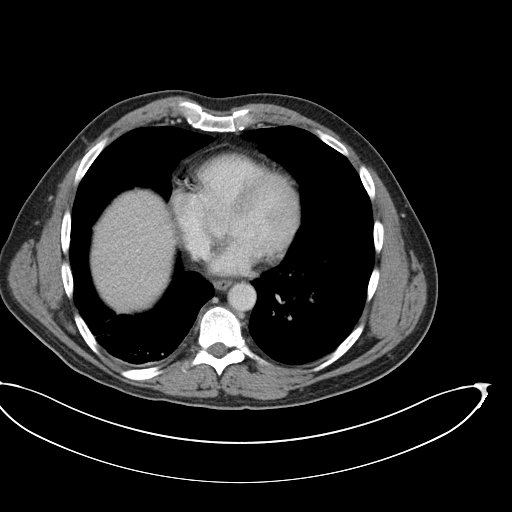

[Series 4: coronal a/|p · coronal · 0.74mm/px · 3 of 154 slices shown]
[im 52/154  soft-tissue]
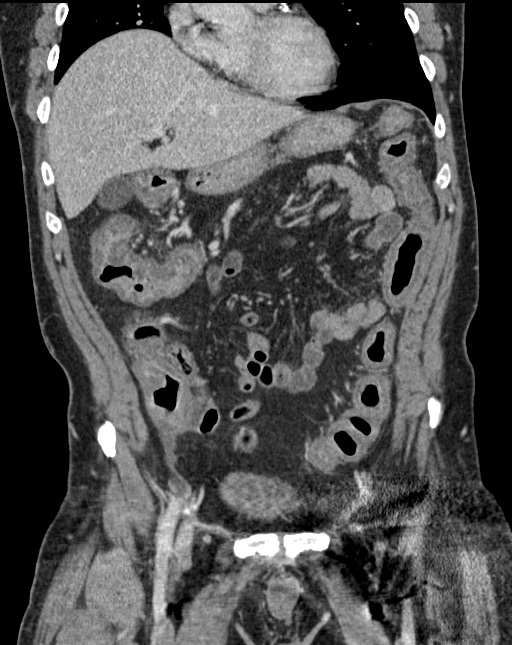
[im 69/154  soft-tissue]
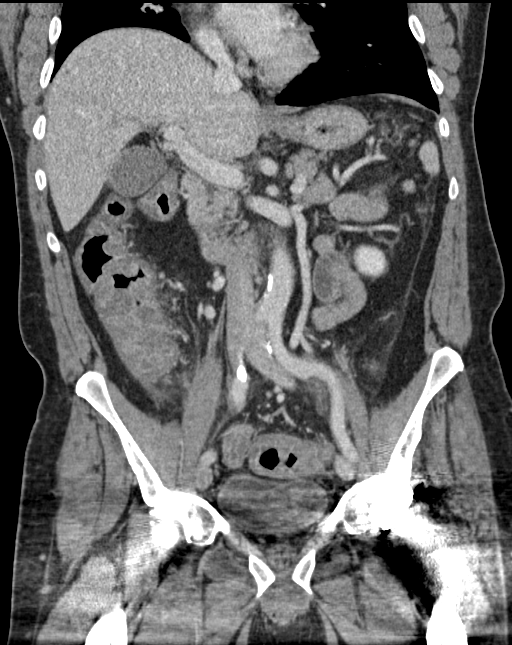
[im 86/154  soft-tissue]
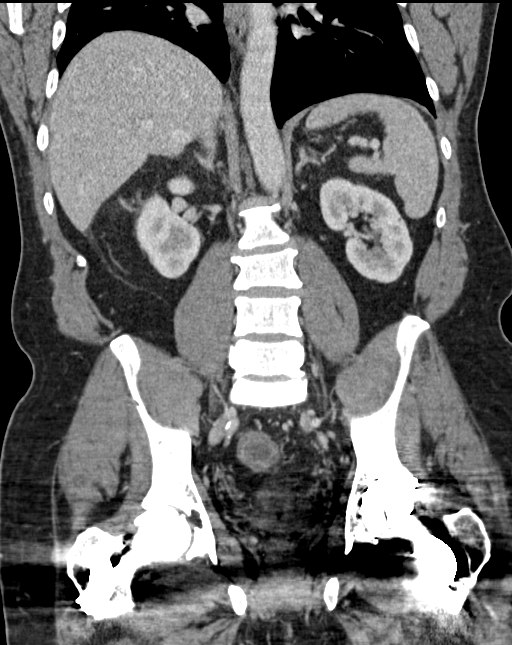

[15 of 46 positions shown; findings below may reference images not displayed]

FINDINGS: Lower chest: Right lower lobe bronchiectasis with subpleural
atelectasis and/or scarring. Coronary arteriosclerosis noted of the
top-normal sized heart. No pericardial effusion. No dominant mass
identified at the lung bases.

Hepatobiliary: The hypodense right hepatic lobe lesions are less
prominent or no longer present on current exam likely representing
post treatment change. The largest is currently 1.9 x 1.8 cm with
central calcification versus 5.9 x 4.7 cm in 7245. The more medial
lesion is no longer identified. No new hepatic lesion or biliary
dilatation is noted. The gallbladder is physiologically distended
without stones.

Pancreas: Normal

Spleen: Normal

Adrenals/Urinary Tract: No adrenal mass. The kidneys enhance
homogeneously with small 12 mm and 4 mm cysts suspected within the
lower pole the right kidney. Tiny hypodensities are also seen in the
lower pole the left kidney measuring up to 4 mm. No nephrolithiasis
nor obstructive uropathy. The urinary bladder is physiologically
distended.

Stomach/Bowel: There is diffuse transmural thickening with mucosal
enhancement from cecum through rectum consistent with a pain colitis
and proctitis. No bowel perforation. No small bowel dilatation. The
stomach is contracted. No abscess.

Vascular/Lymphatic: Aortoiliac atherosclerosis without aneurysm. No
lymphadenopathy by CT size criteria.

Reproductive: Prostate is unremarkable.

Other: Right femoral nail fixation and a left total hip arthroplasty
are noted with metallic streak artifacts limiting assessment of the
lower pelvis. Scarring is seen overlying the lateral aspect of the
left hip. No inguinal adenopathy is noted.

Musculoskeletal: No acute nor suspicious osseous abnormality. Mild
degenerate change at L5-S1.
IMPRESSION: 1. There is moderate diffuse transmural thickening and inflammation
noted of the colon from cecum to rectum. Findings are in keeping
with proctocolitis. No bowel perforation or abscess. No bowel
obstruction.
2. Post treatment appearance of the right hepatic lobe.
3. Small bilateral renal cysts some of which are too small to
further characterize.
4. Coronary arteriosclerosis and aortic atherosclerosis.
5. Right lower lobe subsegmental scarring and/or atelectasis with
bronchiectasis.

## 2019-04-22 ENCOUNTER — Other Ambulatory Visit: Payer: Self-pay

## 2019-04-22 ENCOUNTER — Ambulatory Visit: Payer: Managed Care, Other (non HMO) | Attending: Orthopedic Surgery

## 2019-04-22 DIAGNOSIS — M25551 Pain in right hip: Secondary | ICD-10-CM | POA: Insufficient documentation

## 2019-04-22 DIAGNOSIS — R2689 Other abnormalities of gait and mobility: Secondary | ICD-10-CM | POA: Diagnosis present

## 2019-04-22 DIAGNOSIS — M6281 Muscle weakness (generalized): Secondary | ICD-10-CM | POA: Insufficient documentation

## 2019-04-22 DIAGNOSIS — R252 Cramp and spasm: Secondary | ICD-10-CM | POA: Insufficient documentation

## 2019-04-22 NOTE — Patient Instructions (Signed)
Access Code: [email or web address]  URL: [email or web address]  Date: 04/22/2019  Prepared by: Sigurd Sos   Exercises Single Knee to Chest Stretch - 3 reps - 20 hold - 2x daily - 7x weekly Supine Piriformis Stretch - 3 reps - 1 sets - 20 hold - 3x daily - 7x weekly Seated Piriformis Stretch with Trunk Bend - 3 reps - 1 sets - 20 hold - 3x daily - 7x weekly Seated Hamstring Stretch - 20 reps - 1 sets - 20 hold - 2x daily - 7x weekly

## 2019-04-22 NOTE — Therapy (Signed)
[facility] [address], [place], [place], [address] Phone: [phone]   Fax:  [phone]  Physical Therapy Evaluation  Patient Details  Name: Jeffery Cobb MRN: [id number] Date of Birth: August 16, 1956 Referring Provider (PT): Cherre Blanc Mayford Knife, MD   Encounter Date: 04/22/2019  PT End of Session - 04/22/19 1654    Visit Number  1    Date for PT Re-Evaluation  06/17/19    Authorization Type  [employer]    PT Start Time  1618    PT Stop Time  1654    PT Time Calculation (min)  36 min    Activity Tolerance  Patient tolerated treatment well    Behavior During Therapy  [facility] for tasks assessed/performed       Past Medical History:  Diagnosis Date  . Cancer ([place])    lung  . Depression     Past Surgical History:  Procedure Laterality Date  . JOINT REPLACEMENT Left 6.20/2018   hip    There were no vitals filed for this visit.   Subjective Assessment - 04/22/19 1625    Subjective  Pt reports to PT with Rt hip pain that began 6 months ago that has been progressing over this time.  Pt saw MD yesterday, no evidence of need for replacement at this time.  MD will have guided injection on 05/04/2019.    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  Yes    Pain Score  5     Pain Location  Hip    Pain Orientation  Right    Pain Type  Chronic pain    Pain Onset  More than a month ago    Aggravating Factors   end of the day when trying to sleep, constant during the day    Pain Relieving Factors  nothing helps         [facility] PT Assessment - 04/22/19 0001      Assessment   Medical Diagnosis  hip abductor tendinitis    Referring Provider (PT)  Lockie Mola, MD    Onset Date/Surgical Date  10/20/18    Next MD Visit  05/04/19    Prior  Therapy  s/p Lt TKA-2 years ago      Precautions   Precautions  Other (comment)   metastatic lung cancer     Balance Screen   Has the patient fallen in the past 6 months  No    Has the patient had a decrease in activity level because of a fear of falling?   No    Is the patient reluctant to leave their home because of a fear of falling?   No      Home Environment   Living Environment  Private residence    Living Arrangements  Other relatives    Type of [place] Access  Level entry    Home Layout  One level      Prior Function   Level of Independence  Independent    Vocation  Full time employment    Programme researcher, broadcasting/film/video- desk work    Leisure  swimming at aquatic center      [employer]   Overall Cognitive Status  Within Functional Limits for tasks assessed      Observation/Other Assessments   Focus on Therapeutic Outcomes (FOTO)   34% limitation      Posture/Postural Control   Posture/Postural Control  Postural limitations    Posture Comments  genu recurvatum on Lt      ROM / Strength   AROM / PROM / Strength  AROM;PROM;Strength      AROM   Overall AROM   Deficits    Overall AROM Comments  Rt hip flexibility limited by 50%, Lt ER and flexion limited by 25%- other Lt hip not tested due to hip precautions.       PROM   Overall PROM   Deficits    Overall PROM Comments  see above      Strength   Overall Strength  Deficits    Strength Assessment Site  Knee;Hip    Right/Left Hip  Right;Left    Right Hip Flexion  4/5    Right Hip Extension  4+/5    Right Hip External Rotation   4/5    Right Hip Internal Rotation  4/5    Right Hip ABduction  4-/5    Left Hip Flexion  4+/5    Left Hip Extension  4/5    Left Hip External Rotation  4+/5    Left Hip Internal Rotation  4+/5    Left Hip ABduction  4+/5    Right/Left Knee  Right;Left    Right Knee Flexion  4+/5    Right Knee Extension  4+/5    Left Knee Flexion  4/5    Left Knee Extension  4+/5       Palpation   Palpation comment  palpable tenderness over Rt glute medius       Transfers   Transfers  Independent with all Transfers      Ambulation/Gait   Ambulation/Gait  Yes    Gait Pattern  Decreased stance time - left;Left genu recurvatum                Objective measurements completed on examination: See above findings.              PT Education - 04/22/19 1653    Education Details  Access Code: [email or web address]    Person(s) Educated  Patient    Methods  Explanation;Demonstration    Comprehension  Verbalized understanding       PT Short Term Goals - 04/22/19 1631      PT SHORT TERM GOAL #1   Title  independent with intial HEP    Time  4    Period  Weeks    Status  New    Target Date  05/20/19      PT SHORT TERM GOAL #2   Title  report < or = to 4/10 Rt hip pain with sleep at night    Time  4    Period  Weeks    Status  New    Target Date  05/20/19      PT SHORT TERM GOAL #3   Title  report a 30% reduction in Rt hip pain with home and work tasks    Baseline  ---    Time  4    Period  Weeks    Status  New    Target Date  05/20/19        PT Long Term Goals - 04/22/19 1701      PT LONG TERM GOAL #1  Title  independent with HEP and how to progress    Time  8    Period  Weeks    Status  New    Target Date  06/17/19      PT LONG TERM GOAL #2   Title  report < or = to 2/10 max Rt hip pain at the end of the day    Time  8    Period  Weeks    Status  New    Target Date  06/17/19      PT LONG TERM GOAL #3   Title  reduce FOTO to < or = to 23% limitation    Time  8    Period  Weeks    Status  New    Target Date  06/17/19      PT LONG TERM GOAL #4   Title  demonstrate 4+/5 Rt hip abduction to reduce pain with endurance tasks    Time  8    Period  Weeks    Status  New    Target Date  06/17/19             Plan - 04/22/19 1706    Clinical Impression Statement  Pt presents to PT with Rt hip pain that began 6 months ago and has  been worsening with time.  Pt reports 5-7/10 Rt gluteal pain that is worse at the end of the day.  Pt demonstrates reduced Rt hip flexibility and strength and has palpable tenderness over Rt glute medius.  Pt has history of Lt hip replacement (posterior approach), ORIF of Rt hip and metastatic lung cancer.  Pt demonstrates Lt genu recurvatum with gait and single limb stance on the Lt due to chronic quad weakness.  Pt will benefit from skilled PT for Rt hip flexibility, strength and manual therapy to address pain.    Personal Factors and Comorbidities  Comorbidity 3+    Comorbidities  metastatic lung cancer, Lt hip replacement, ORIF on the Rt    Examination-Activity Limitations  Dressing;Stand    Examination-Participation Restrictions  Community Activity;Yard Work    [employer]chant [employer] officer  Evolving/Moderate complexity    Clinical Decision Making  Moderate    Rehab Potential  Good    PT Frequency  2x / week    PT Duration  8 weeks    PT Treatment/Interventions  ADLs/Self Care Home Management;Cryotherapy;Gait training;Moist Heat;Functional mobility training;Therapeutic activities;Therapeutic exercise;Neuromuscular re-education;Manual techniques;Passive range of motion;Dry needling;Taping    PT Next Visit Plan  dry needling to Rt gluteals, review flexibility, gluteal strength    PT Home Exercise Plan  Access Code: [email or web address]    Consulted and Agree with Plan of Care  Patient       Patient will benefit from skilled therapeutic intervention in order to improve the following deficits and impairments:  Abnormal gait, Decreased activity tolerance, Decreased strength, Difficulty walking, Impaired flexibility, Increased muscle spasms, Pain  Visit Diagnosis: Muscle weakness (generalized) - Plan: PT plan of care cert/re-cert  Other abnormalities of gait and mobility - Plan: PT plan of care cert/re-cert  Pain in right hip - Plan: PT plan of care cert/re-cert  Cramp and spasm - Plan: PT plan of  care cert/re-cert     Problem List Patient Active Problem List   Diagnosis Date Noted  . Pancolitis ([place]) 03/29/2017  . C. difficile colitis 03/29/2017  . H/O: lung cancer 03/29/2017  . ICH (intracerebral hemorrhage) ([place]) 07/14/2016  . Portal vein thrombosis 07/14/2016  . Short-term  memory loss 07/14/2016  . Brain metastases ([place]) 07/14/2016  . Metastatic lung cancer (metastasis from lung to other site) ([place]) 07/13/2016  . Brain metastasis ([place]) 07/13/2016    Sigurd Sos, PT 04/22/19 5:09 PM  [facility] [address] [place], [place], [address] Phone: [phone]   Fax:  [phone]  Name: Jeffery Cobb MRN: [id number] Date of Birth: May 14, 1956

## 2019-04-29 ENCOUNTER — Ambulatory Visit: Payer: Managed Care, Other (non HMO)

## 2019-05-05 ENCOUNTER — Ambulatory Visit: Payer: Managed Care, Other (non HMO)

## 2019-05-05 ENCOUNTER — Other Ambulatory Visit: Payer: Self-pay

## 2019-05-05 DIAGNOSIS — M6281 Muscle weakness (generalized): Secondary | ICD-10-CM

## 2019-05-05 DIAGNOSIS — R2689 Other abnormalities of gait and mobility: Secondary | ICD-10-CM

## 2019-05-05 DIAGNOSIS — M25551 Pain in right hip: Secondary | ICD-10-CM

## 2019-05-05 DIAGNOSIS — R252 Cramp and spasm: Secondary | ICD-10-CM

## 2019-05-05 NOTE — Therapy (Signed)
[facility] [address], [place], [place], [address] Phone: [phone]   Fax:  [phone]  Physical Therapy Treatment  Patient Details  Name: Jeffery Cobb MRN: [id number] Date of Birth: 10-Feb-1956 Referring Provider (PT): Cherre Blanc Mayford Knife, MD   Encounter Date: 05/05/2019  PT End of Session - 05/05/19 1207    Visit Number  2    Date for PT Re-Evaluation  06/17/19    Authorization Type  [employer]    PT Start Time  1135    PT Stop Time  1205    PT Time Calculation (min)  30 min    Activity Tolerance  Patient tolerated treatment well    Behavior During Therapy  [facility] for tasks assessed/performed       Past Medical History:  Diagnosis Date  . Cancer ([place])    lung  . Depression     Past Surgical History:  Procedure Laterality Date  . JOINT REPLACEMENT Left 6.20/2018   hip    There were no vitals filed for this visit.  Subjective Assessment - 05/05/19 1138    Subjective  I'm about the same.    Pertinent History  metastatic lung cancer (no longer being treated), Lt THA (posterior approach), ORIF Rt hip    Currently in Pain?  Yes    Pain Score  --   up 5/10   Pain Location  Hip    Pain Orientation  Right    Pain Descriptors / Indicators  Tightness    Pain Type  Chronic pain    Pain Onset  More than a month ago    Pain Frequency  Constant    Aggravating Factors   end of the day when trying to sleep, constant during the day    Pain Relieving Factors  nothing helps- it just comes and goes                       [facility] Adult PT Treatment/Exercise - 05/05/19 0001      Exercises   Exercises  Knee/Hip      Knee/Hip Exercises: Stretches   Active Hamstring Stretch  Both;3 reps;20 seconds    Knee: Self-Stretch to increase Flexion  3 reps;Left;Right;30 seconds    Piriformis Stretch  Left;Right;3 reps;20 seconds      Manual Therapy   Manual Therapy  Soft tissue mobilization;Myofascial release     Soft tissue mobilization  elongation to Rt gluteals after dry needling       Trigger Point Dry Needling - 05/05/19 0001    Consent Given?  Yes    Education Handout Provided  Yes    Muscles Treated Back/Hip  Gluteus minimus;Gluteus medius;Piriformis   Rt side   Gluteus Minimus Response  Twitch response elicited;Palpable increased muscle length    Gluteus Medius Response  Twitch response elicited;Palpable increased muscle length    Piriformis Response  Twitch response elicited;Palpable increased muscle length           PT Education - 05/05/19 1146    Education Details  DN info    Person(s) Educated  Patient    Methods  Explanation;Demonstration;Handout    Comprehension  Verbalized understanding;Returned demonstration       PT Short Term Goals - 04/22/19 1631      PT SHORT TERM GOAL #1   Title  independent with intial HEP    Time  4    Period  Weeks    Status  New  Target Date  05/20/19      PT SHORT TERM GOAL #2   Title  report < or = to 4/10 Rt hip pain with sleep at night    Time  4    Period  Weeks    Status  New    Target Date  05/20/19      PT SHORT TERM GOAL #3   Title  report a 30% reduction in Rt hip pain with home and work tasks    Baseline  ---    Time  4    Period  Weeks    Status  New    Target Date  05/20/19        PT Long Term Goals - 04/22/19 1701      PT LONG TERM GOAL #1   Title  independent with HEP and how to progress    Time  8    Period  Weeks    Status  New    Target Date  06/17/19      PT LONG TERM GOAL #2   Title  report < or = to 2/10 max Rt hip pain at the end of the day    Time  8    Period  Weeks    Status  New    Target Date  06/17/19      PT LONG TERM GOAL #3   Title  reduce FOTO to < or = to 23% limitation    Time  8    Period  Weeks    Status  New    Target Date  06/17/19      PT LONG TERM GOAL #4   Title  demonstrate 4+/5 Rt hip abduction to reduce pain with endurance tasks    Time  8    Period  Weeks     Status  New    Target Date  06/17/19            Plan - 05/05/19 1144    Clinical Impression Statement  Pt with first time follow-up after evaluation.  Pt has not been consistent with HEP.  Pt with significant Rt hip stiffness and limited A/ROM.  Pt with tension and trigger points in Rt gluteals.  Pt demonstrated improved tissue mobility and reduced tension after dry needling today.  Pt was able to return demonstrated all HEP correctly and PT discussed importance of compliance with HEP.  Pt will continue to benefit from skilled PT to address Rt hip flexibility and strength.    Comorbidities  metastatic lung cancer, Lt hip replacement, ORIF on the Rt    PT Frequency  2x / week    PT Duration  8 weeks    PT Treatment/Interventions  ADLs/Self Care Home Management;Cryotherapy;Gait training;Moist Heat;Functional mobility training;Therapeutic activities;Therapeutic exercise;Neuromuscular re-education;Manual techniques;Passive range of motion;Dry needling;Taping    PT Next Visit Plan  assess response to dry needling to Rt gluteals, review flexibility if needed, gluteal strength    PT Home Exercise Plan  Access Code: [email or web address]    Consulted and Agree with Plan of Care  Patient       Patient will benefit from skilled therapeutic intervention in order to improve the following deficits and impairments:  Abnormal gait, Decreased activity tolerance, Decreased strength, Difficulty walking, Impaired flexibility, Increased muscle spasms, Pain  Visit Diagnosis: Muscle weakness (generalized)  Other abnormalities of gait and mobility  Pain in right hip  Cramp and spasm     Problem List Patient Active Problem  List   Diagnosis Date Noted  . Pancolitis ([place]) 03/29/2017  . C. difficile colitis 03/29/2017  . H/O: lung cancer 03/29/2017  . ICH (intracerebral hemorrhage) ([place]) 07/14/2016  . Portal vein thrombosis 07/14/2016  . Short-term memory loss 07/14/2016  . Brain metastases ([place]) 07/14/2016   . Metastatic lung cancer (metastasis from lung to other site) ([place]) 07/13/2016  . Brain metastasis ([place]) 07/13/2016   Sigurd Sos, PT 05/05/19 12:08 PM  [facility] [address], [place], [place], [address] Phone: [phone]   Fax:  [phone]  Name: Jeffery Cobb MRN: [id number] Date of Birth: May 07, 1956

## 2019-05-05 NOTE — Patient Instructions (Signed)

## 2019-05-12 ENCOUNTER — Ambulatory Visit: Payer: Managed Care, Other (non HMO)

## 2019-05-13 ENCOUNTER — Ambulatory Visit: Payer: Managed Care, Other (non HMO)

## 2019-05-13 ENCOUNTER — Other Ambulatory Visit: Payer: Self-pay

## 2019-05-13 DIAGNOSIS — M25551 Pain in right hip: Secondary | ICD-10-CM

## 2019-05-13 DIAGNOSIS — R2689 Other abnormalities of gait and mobility: Secondary | ICD-10-CM

## 2019-05-13 DIAGNOSIS — M6281 Muscle weakness (generalized): Secondary | ICD-10-CM | POA: Diagnosis not present

## 2019-05-13 DIAGNOSIS — R252 Cramp and spasm: Secondary | ICD-10-CM

## 2019-05-13 NOTE — Patient Instructions (Signed)
Access Code: [email or web address]  URL: [email or web address]  Date: 05/13/2019  Prepared by: Sigurd Sos   Exercises   Supine Bridge - 10 reps - 2 sets - 5 hold - 2x daily - 7x weekly Clamshell with Resistance - 10 reps - 2 sets - 2x daily - 7x weekly

## 2019-05-13 NOTE — Therapy (Signed)
[facility] [address] [place], [place], [address] Phone: [phone]   Fax:  [phone]  Physical Therapy Treatment  Patient Details  Name: Jeffery Cobb MRN: [id number] Date of Birth: 1955/11/25 Referring Provider (PT): Cherre Blanc Mayford Knife, MD   Encounter Date: 05/13/2019  PT End of Session - 05/13/19 1053    Visit Number  3    Date for PT Re-Evaluation  06/17/19    PT Start Time  1017   dry needling   PT Stop Time  1055    PT Time Calculation (min)  38 min    Activity Tolerance  Patient tolerated treatment well    Behavior During Therapy  [facility] for tasks assessed/performed       Past Medical History:  Diagnosis Date  . Cancer ([place])    lung  . Depression     Past Surgical History:  Procedure Laterality Date  . JOINT REPLACEMENT Left 6.20/2018   hip    There were no vitals filed for this visit.  Subjective Assessment - 05/13/19 1017    Subjective  It feels like it is feeling better.  My Lt hip is 50% improved.    Pertinent History  metastatic lung cancer (no longer being treated), Lt THA (posterior approach), ORIF Rt hip    Patient Stated Goals  reduce Rt hip pain    Currently in Pain?  Yes    Pain Score  0-No pain   up to 5/10 max   Pain Location  Hip    Pain Orientation  Right    Pain Descriptors / Indicators  Tightness    Pain Onset  More than a month ago    Pain Frequency  Constant    Aggravating Factors   night, after sitting still for a long period    Pain Relieving Factors  move around                       [facility] Adult PT Treatment/Exercise - 05/13/19 0001      Knee/Hip Exercises: Stretches   Knee: Self-Stretch to increase Flexion  3 reps;Left;Right;30 seconds      Knee/Hip Exercises: Supine   Bridges  Strengthening;Both;2 sets;10 reps      Knee/Hip Exercises: Sidelying   Clams  red 2x10      Manual Therapy   Manual Therapy  Soft tissue mobilization;Myofascial  release    Soft tissue mobilization  elongation to Rt gluteals after dry needling       Trigger Point Dry Needling - 05/13/19 0001    Consent Given?  Yes    Muscles Treated Back/Hip  Gluteus minimus;Gluteus medius;Piriformis   Rt side   Gluteus Minimus Response  Twitch response elicited;Palpable increased muscle length    Gluteus Medius Response  Twitch response elicited;Palpable increased muscle length    Piriformis Response  Twitch response elicited;Palpable increased muscle length           PT Education - 05/13/19 1036    Education Details  Access Code: [email or web address]    Person(s) Educated  Patient    Methods  Explanation;Demonstration;Handout    Comprehension  Verbalized understanding;Returned demonstration       PT Short Term Goals - 05/13/19 1022      PT SHORT TERM GOAL #1   Title  independent with intial HEP    Status  Achieved      PT SHORT TERM GOAL #2   Title  report < or =  to 4/10 Rt hip pain with sleep at night    Status  Achieved      PT SHORT TERM GOAL #3   Title  report a 30% reduction in Rt hip pain with home and work tasks    Baseline  50%    Status  Achieved        PT Long Term Goals - 04/22/19 1701      PT LONG TERM GOAL #1   Title  independent with HEP and how to progress    Time  8    Period  Weeks    Status  New    Target Date  06/17/19      PT LONG TERM GOAL #2   Title  report < or = to 2/10 max Rt hip pain at the end of the day    Time  8    Period  Weeks    Status  New    Target Date  06/17/19      PT LONG TERM GOAL #3   Title  reduce FOTO to < or = to 23% limitation    Time  8    Period  Weeks    Status  New    Target Date  06/17/19      PT LONG TERM GOAL #4   Title  demonstrate 4+/5 Rt hip abduction to reduce pain with endurance tasks    Time  8    Period  Weeks    Status  New    Target Date  06/17/19            Plan - 05/13/19 1026    Clinical Impression Statement  Pt reports 50% overall reduction of Rt  gluteal/hip pain since the start of care.  Pt reports < 4/10 pain at night.  PT added strength exercises to HEP for gluteal strength advancement.  Pt required verbal cues for gluteal activation and core activation with bridging exercise.  Pt with trigger points in Rt gluteals and demonstrated improved tissue mobility after manual therapy and dry needling today.  Pt will continue to benefit from skilled PT to address Rt hip strength, flexibility and manual to address trigger points.    PT Treatment/Interventions  ADLs/Self Care Home Management;Cryotherapy;Gait training;Moist Heat;Functional mobility training;Therapeutic activities;Therapeutic exercise;Neuromuscular re-education;Manual techniques;Passive range of motion;Dry needling;Taping    PT Next Visit Plan  assess response to dry needling to Rt gluteals, review flexibility if needed, gluteal strength.  See how Rt hip procedure goes.    PT Home Exercise Plan  Access Code: [email or web address]    Consulted and Agree with Plan of Care  Patient       Patient will benefit from skilled therapeutic intervention in order to improve the following deficits and impairments:  Abnormal gait, Decreased activity tolerance, Decreased strength, Difficulty walking, Impaired flexibility, Increased muscle spasms, Pain  Visit Diagnosis: Muscle weakness (generalized)  Other abnormalities of gait and mobility  Pain in right hip  Cramp and spasm     Problem List Patient Active Problem List   Diagnosis Date Noted  . Pancolitis ([place]) 03/29/2017  . C. difficile colitis 03/29/2017  . H/O: lung cancer 03/29/2017  . ICH (intracerebral hemorrhage) ([place]) 07/14/2016  . Portal vein thrombosis 07/14/2016  . Short-term memory loss 07/14/2016  . Brain metastases ([place]) 07/14/2016  . Metastatic lung cancer (metastasis from lung to other site) ([place]) 07/13/2016  . Brain metastasis ([place]) 07/13/2016    Sigurd Sos, PT 05/13/19 11:03 AM  [facility] [address], [place], [place], [address] Phone: [phone]   Fax:  [phone]  Name: Jeffery Cobb MRN: [id number] Date of Birth: [birth year]

## 2019-05-19 ENCOUNTER — Ambulatory Visit: Payer: Managed Care, Other (non HMO)

## 2019-05-19 ENCOUNTER — Other Ambulatory Visit: Payer: Self-pay

## 2019-05-19 DIAGNOSIS — R252 Cramp and spasm: Secondary | ICD-10-CM

## 2019-05-19 DIAGNOSIS — M6281 Muscle weakness (generalized): Secondary | ICD-10-CM | POA: Diagnosis not present

## 2019-05-19 DIAGNOSIS — R2689 Other abnormalities of gait and mobility: Secondary | ICD-10-CM

## 2019-05-19 DIAGNOSIS — M25551 Pain in right hip: Secondary | ICD-10-CM

## 2019-05-19 NOTE — Therapy (Signed)
[facility] [address] [place], [place], [address] Phone: [phone]   Fax:  [phone]  Physical Therapy Treatment  Patient Details  Name: Jeffery Cobb MRN: [id number] Date of Birth: July 06, 1956 Referring Provider (PT): Cherre Blanc Mayford Knife, MD   Encounter Date: 05/19/2019  PT End of Session - 05/19/19 1249    Visit Number  4    Date for PT Re-Evaluation  06/17/19    Authorization Type  [employer]    PT Start Time  1214    PT Stop Time  1250    PT Time Calculation (min)  36 min    Activity Tolerance  Patient tolerated treatment well    Behavior During Therapy  [facility] for tasks assessed/performed       Past Medical History:  Diagnosis Date  . Cancer ([place])    lung  . Depression     Past Surgical History:  Procedure Laterality Date  . JOINT REPLACEMENT Left 6.20/2018   hip    There were no vitals filed for this visit.  Subjective Assessment - 05/19/19 1217    Subjective  I had my Lt hip aspirated last week  80% impovement in Rt hip due to needling and aspiration.    Currently in Pain?  No/denies   5/10 max   Pain Location  Hip    Pain Orientation  Right    Pain Descriptors / Indicators  Tightness;Shooting    Pain Onset  More than a month ago    Pain Frequency  Constant    Aggravating Factors   sitting long periods of time    Pain Relieving Factors  dry needling, stretching                       [facility] Adult PT Treatment/Exercise - 05/19/19 0001      Knee/Hip Exercises: Seated   Sit to Sand  2 sets;10 reps;without UE support   some UE support initially     Knee/Hip Exercises: Supine   Bridges  Strengthening;Both;2 sets;10 reps      Knee/Hip Exercises: Sidelying   Clams  red 2x10      Manual Therapy   Manual Therapy  Soft tissue mobilization;Myofascial release    Soft tissue mobilization  elongation to Rt gluteals after dry needling       Trigger Point Dry Needling - 05/19/19  0001    Consent Given?  Yes    Muscles Treated Back/Hip  Gluteus minimus;Gluteus medius;Piriformis   Rt side   Gluteus Minimus Response  Twitch response elicited;Palpable increased muscle length    Gluteus Medius Response  Twitch response elicited;Palpable increased muscle length    Piriformis Response  Twitch response elicited;Palpable increased muscle length             PT Short Term Goals - 05/13/19 1022      PT SHORT TERM GOAL #1   Title  independent with intial HEP    Status  Achieved      PT SHORT TERM GOAL #2   Title  report < or = to 4/10 Rt hip pain with sleep at night    Status  Achieved      PT SHORT TERM GOAL #3   Title  report a 30% reduction in Rt hip pain with home and work tasks    Baseline  50%    Status  Achieved        PT Long Term Goals - 05/19/19 1255  PT LONG TERM GOAL #1   Title  independent with HEP and how to progress    Status  On-going      PT LONG TERM GOAL #2   Title  report < or = to 2/10 max Rt hip pain at the end of the day    Time  8    Period  Weeks    Status  On-going            Plan - 05/19/19 1220    Clinical Impression Statement  Pt reports 80% overall improvement in Rt hip symptoms since the start of care. Pt had his hip joint aspirated last week and feels that needling and this recent procedure have helped a lot.  Pt required minor verbal cues for technique with sidelying clams. Pt demonstrates bil quad weakness and requires some UE support and demonstrates uncontrolled descent with sit to stand.  Pt with trigger points and tension in the Rt gluteals and demonstrated improved tissue mobility after dry needling today.  Pt will continue to benefit from skilled PT to address LE strength and manual therapy to address pain.    PT Frequency  2x / week    PT Duration  8 weeks    PT Treatment/Interventions  ADLs/Self Care Home Management;Cryotherapy;Gait training;Moist Heat;Functional mobility training;Therapeutic  activities;Therapeutic exercise;Neuromuscular re-education;Manual techniques;Passive range of motion;Dry needling;Taping    PT Next Visit Plan  assess response to dry needling to Rt gluteals, review flexibility if needed, gluteal strength.    PT Home Exercise Plan  Access Code: [email or web address]    Consulted and Agree with Plan of Care  Patient       Patient will benefit from skilled therapeutic intervention in order to improve the following deficits and impairments:  Abnormal gait, Decreased activity tolerance, Decreased strength, Difficulty walking, Impaired flexibility, Increased muscle spasms, Pain  Visit Diagnosis: Other abnormalities of gait and mobility  Muscle weakness (generalized)  Pain in right hip  Cramp and spasm     Problem List Patient Active Problem List   Diagnosis Date Noted  . Pancolitis ([place]) 03/29/2017  . C. difficile colitis 03/29/2017  . H/O: lung cancer 03/29/2017  . ICH (intracerebral hemorrhage) ([place]) 07/14/2016  . Portal vein thrombosis 07/14/2016  . Short-term memory loss 07/14/2016  . Brain metastases ([place]) 07/14/2016  . Metastatic lung cancer (metastasis from lung to other site) ([place]) 07/13/2016  . Brain metastasis ([place]) 07/13/2016     Sigurd Sos, PT 05/19/19 12:58 PM  [facility] [address], [place], [place], [address] Phone: [phone]   Fax:  [phone]  Name: Jeffery Cobb MRN: [id number] Date of Birth: 03/18/1956

## 2019-05-26 ENCOUNTER — Ambulatory Visit: Payer: Managed Care, Other (non HMO)

## 2019-06-03 ENCOUNTER — Ambulatory Visit: Payer: Managed Care, Other (non HMO) | Attending: Orthopedic Surgery

## 2019-06-03 ENCOUNTER — Other Ambulatory Visit: Payer: Self-pay

## 2019-06-03 DIAGNOSIS — M545 Low back pain: Secondary | ICD-10-CM | POA: Insufficient documentation

## 2019-06-03 DIAGNOSIS — M25551 Pain in right hip: Secondary | ICD-10-CM | POA: Insufficient documentation

## 2019-06-03 DIAGNOSIS — R2689 Other abnormalities of gait and mobility: Secondary | ICD-10-CM | POA: Diagnosis present

## 2019-06-03 DIAGNOSIS — G8929 Other chronic pain: Secondary | ICD-10-CM | POA: Diagnosis present

## 2019-06-03 DIAGNOSIS — M6281 Muscle weakness (generalized): Secondary | ICD-10-CM | POA: Insufficient documentation

## 2019-06-03 DIAGNOSIS — R252 Cramp and spasm: Secondary | ICD-10-CM | POA: Diagnosis present

## 2019-06-03 NOTE — Therapy (Signed)
[facility] [address] [place], [place], [address] Phone: [phone]   Fax:  [phone]  Physical Therapy Treatment  Patient Details  Name: Jeffery Cobb MRN: [id number] Date of Birth: [birth year] Referring Provider (PT): Cherre Blanc Mayford Knife, MD   Encounter Date: 06/03/2019  PT End of Session - 06/03/19 1135    Visit Number  5    Date for PT Re-Evaluation  06/17/19    PT Start Time  1105    PT Stop Time  1135    PT Time Calculation (min)  30 min    Activity Tolerance  Patient tolerated treatment well    Behavior During Therapy  [facility] for tasks assessed/performed       Past Medical History:  Diagnosis Date  . Cancer ([place])    lung  . Depression     Past Surgical History:  Procedure Laterality Date  . JOINT REPLACEMENT Left 6.20/2018   hip    There were no vitals filed for this visit.  Subjective Assessment - 06/03/19 1107    Subjective  My Rt hip feels good.    Pertinent History  metastatic lung cancer (no longer being treated), Lt THA (posterior approach), ORIF Rt hip         [facility] PT Assessment - 06/03/19 0001      Observation/Other Assessments   Focus on Therapeutic Outcomes (FOTO)   39% limitation                   [facility] Adult PT Treatment/Exercise - 06/03/19 0001      Manual Therapy   Manual Therapy  Soft tissue mobilization;Myofascial release    Soft tissue mobilization  elongation to Rt gluteals after dry needling       Trigger Point Dry Needling - 06/03/19 0001    Consent Given?  Yes    Muscles Treated Back/Hip  Gluteus minimus;Gluteus medius;Piriformis   Rt side   Gluteus Minimus Response  Twitch response elicited;Palpable increased muscle length    Gluteus Medius Response  Twitch response elicited;Palpable increased muscle length    Piriformis Response  Twitch response elicited;Palpable increased muscle length             PT Short Term Goals - 05/13/19  1022      PT SHORT TERM GOAL #1   Title  independent with intial HEP    Status  Achieved      PT SHORT TERM GOAL #2   Title  report < or = to 4/10 Rt hip pain with sleep at night    Status  Achieved      PT SHORT TERM GOAL #3   Title  report a 30% reduction in Rt hip pain with home and work tasks    Baseline  50%    Status  Achieved        PT Long Term Goals - 06/03/19 1108      PT LONG TERM GOAL #1   Title  independent with HEP and how to progress    Baseline  weight training at the gym    Time  8    Period  Weeks    Status  On-going      PT LONG TERM GOAL #2   Title  report < or = to 2/10 max Rt hip pain at the end of the day    Status  Achieved      PT LONG TERM GOAL #4   Title  demonstrate  4+/5 Rt hip abduction to reduce pain with endurance tasks            Plan - 06/03/19 1143    Clinical Impression Statement  Pt has returned to the gym and has been working on strength progression.  Pt demonstrated improved ability to perform sit to stand without UE support today.  FOTO is not improved although pt denies any pain at this time and is limited only by weakness and fatigue.  Pt with trigger points and tension in Rt gluteals although this is improved overall.  Pt will continue to benefit from skilled PT to address strength, flexibility and manual to address trigger points.    PT Frequency  2x / week    PT Duration  8 weeks    PT Treatment/Interventions  ADLs/Self Care Home Management;Cryotherapy;Gait training;Moist Heat;Functional mobility training;Therapeutic activities;Therapeutic exercise;Neuromuscular re-education;Manual techniques;Passive range of motion;Dry needling;Taping    PT Next Visit Plan  1 more session probable.  Final goal assessment, dry needling to gluteals    PT Home Exercise Plan  Access Code: [email or web address]    Recommended Other Services  initial certification is signed       Patient will benefit from skilled therapeutic intervention in order to  improve the following deficits and impairments:  Abnormal gait, Decreased activity tolerance, Decreased strength, Difficulty walking, Impaired flexibility, Increased muscle spasms, Pain  Visit Diagnosis: Muscle weakness (generalized)  Other abnormalities of gait and mobility  Pain in right hip  Cramp and spasm     Problem List Patient Active Problem List   Diagnosis Date Noted  . Pancolitis ([place]) 03/29/2017  . C. difficile colitis 03/29/2017  . H/O: lung cancer 03/29/2017  . ICH (intracerebral hemorrhage) ([place]) 07/14/2016  . Portal vein thrombosis 07/14/2016  . Short-term memory loss 07/14/2016  . Brain metastases ([place]) 07/14/2016  . Metastatic lung cancer (metastasis from lung to other site) ([place]) 07/13/2016  . Brain metastasis ([place]) 07/13/2016     Sigurd Sos, PT 06/03/19 11:45 AM  [facility] [address] [place], [place], [address] Phone: [phone]   Fax:  [phone]  Name: Dalton Molesworth MRN: [id number] Date of Birth: [birth year]

## 2019-06-16 ENCOUNTER — Ambulatory Visit: Payer: Managed Care, Other (non HMO)

## 2019-06-16 ENCOUNTER — Other Ambulatory Visit: Payer: Self-pay

## 2019-06-16 DIAGNOSIS — M545 Low back pain: Secondary | ICD-10-CM

## 2019-06-16 DIAGNOSIS — R252 Cramp and spasm: Secondary | ICD-10-CM

## 2019-06-16 DIAGNOSIS — M6281 Muscle weakness (generalized): Secondary | ICD-10-CM | POA: Diagnosis not present

## 2019-06-16 DIAGNOSIS — G8929 Other chronic pain: Secondary | ICD-10-CM

## 2019-06-16 DIAGNOSIS — M25551 Pain in right hip: Secondary | ICD-10-CM

## 2019-06-16 DIAGNOSIS — R2689 Other abnormalities of gait and mobility: Secondary | ICD-10-CM

## 2019-06-16 NOTE — Therapy (Signed)
[facility] [address], [place], [place], [address] Phone: [phone]   Fax:  [phone]  Physical Therapy Treatment  Patient Details  Name: Jeffery Cobb MRN: [id number] Date of Birth: 1956-08-03 Referring Provider (PT): Cherre Blanc Mayford Knife, MD   Encounter Date: 06/16/2019  PT End of Session - 06/16/19 1230    Visit Number  6    Date for PT Re-Evaluation  07/28/19    Authorization Type  [employer]a    PT Start Time  1146    PT Stop Time  1223   dry needling   PT Time Calculation (min)  37 min    Activity Tolerance  Patient tolerated treatment well    Behavior During Therapy  [facility] for tasks assessed/performed       Past Medical History:  Diagnosis Date  . Cancer ([place])    lung  . Depression     Past Surgical History:  Procedure Laterality Date  . JOINT REPLACEMENT Left 6.20/2018   hip    There were no vitals filed for this visit.  Subjective Assessment - 06/16/19 1151    Subjective  I tweaked my back yesterday and it really hurts.  My Rt hip pain has started to bother me again about a week ago.  Unknown cause.    Pertinent History  metastatic lung cancer (no longer being treated), Lt THA (posterior approach), ORIF Rt hip    Currently in Pain?  Yes    Pain Location  Hip    Pain Orientation  Right    Pain Descriptors / Indicators  Tightness;Shooting    Pain Type  Chronic pain    Pain Onset  More than a month ago    Pain Frequency  Constant    Aggravating Factors   twisting, sitting long periods of time    Pain Relieving Factors  dry needling, stretching         [facility] PT Assessment - 06/16/19 0001      Assessment   Medical Diagnosis  hip abductor tendinitis    Referring Provider (PT)  Lockie Mola, MD      Prior Function   Level of Independence  Independent      Observation/Other Assessments   Focus on Therapeutic Outcomes (FOTO)   39% limitation      Strength   Right Hip  ABduction  4/5                   [facility] Adult PT Treatment/Exercise - 06/16/19 0001      Manual Therapy   Manual Therapy  Soft tissue mobilization;Myofascial release    Soft tissue mobilization  elongation to Rt gluteals and bil lumbar spine after dry needling       Trigger Point Dry Needling - 06/16/19 0001    Consent Given?  Yes    Muscles Treated Back/Hip  Gluteus minimus;Gluteus medius;Piriformis;Lumbar multifidi   Rt side   Gluteus Minimus Response  Twitch response elicited;Palpable increased muscle length    Gluteus Medius Response  Twitch response elicited;Palpable increased muscle length    Piriformis Response  Twitch response elicited;Palpable increased muscle length    Lumbar multifidi Response  Twitch response elicited;Palpable increased muscle length             PT Short Term Goals - 05/13/19 1022      PT SHORT TERM GOAL #1   Title  independent with intial HEP    Status  Achieved  PT SHORT TERM GOAL #2   Title  report < or = to 4/10 Rt hip pain with sleep at night    Status  Achieved      PT SHORT TERM GOAL #3   Title  report a 30% reduction in Rt hip pain with home and work tasks    Baseline  50%    Status  Achieved        PT Long Term Goals - 06/16/19 1155      PT LONG TERM GOAL #1   Title  independent with HEP and how to progress    Baseline  weight training at the gym    Time  6    Period  Weeks    Status  On-going    Target Date  07/28/19      PT LONG TERM GOAL #2   Title  report < or = to 2/10 max Rt hip pain at the end of the day    Baseline  5/10    Time  8    Period  Weeks    Status  On-going      PT LONG TERM GOAL #3   Title  reduce FOTO to < or = to 23% limitation    Time  6    Status  On-going    Target Date  07/28/19      PT LONG TERM GOAL #4   Title  demonstrate 4+/5 Rt hip abduction to reduce pain with endurance tasks    Baseline  4/5    Time  6    Period  Weeks    Status  On-going    Target Date   07/28/19            Plan - 06/16/19 1229    Clinical Impression Statement  Pt presents today after 2 week lapse in treatment with LBP and Rt gluteal pain.  Pt has been lifting heavier weights recently and feels like this might be the cause.  Pt demonstrated improved Rt abduction strength to 4/5 today.  Pt is becoming increasingly more active at the pool and gym to improve strength and endurance and reports 50% overall improvement in Rt gluteal symptoms since the start of care.  Pt with tension and trigger points in Rt gluteals and lower lumbar musculature today and reported reduction in symptoms and improved tissue mobility after dry needling and manual therapy today.  Pt will continue to benefit from skilled PT to address tissue mobility, core and hip strength.    PT Frequency  1x / week    PT Duration  6 weeks    PT Treatment/Interventions  ADLs/Self Care Home Management;Cryotherapy;Gait training;Moist Heat;Functional mobility training;Therapeutic activities;Therapeutic exercise;Neuromuscular re-education;Manual techniques;Passive range of motion;Dry needling;Taping    PT Next Visit Plan  continue dry needling to lumbar multifidi and Rt gluteals, review any HEP as needed, progress strength of core and hip    PT Home Exercise Plan  Access Code: [email or web address]    Recommended Other Services  recer sent10/27/2020    Consulted and Agree with Plan of Care  Patient       Patient will benefit from skilled therapeutic intervention in order to improve the following deficits and impairments:  Abnormal gait, Decreased activity tolerance, Decreased strength, Difficulty walking, Impaired flexibility, Increased muscle spasms, Pain  Visit Diagnosis: Muscle weakness (generalized) - Plan: PT plan of care cert/re-cert  Other abnormalities of gait and mobility - Plan: PT plan of care cert/re-cert  Pain in right  hip - Plan: PT plan of care cert/re-cert  Cramp and spasm - Plan: PT plan of care  cert/re-cert  Chronic bilateral low back pain without sciatica - Plan: PT plan of care cert/re-cert     Problem List Patient Active Problem List   Diagnosis Date Noted  . Pancolitis ([place]) 03/29/2017  . C. difficile colitis 03/29/2017  . H/O: lung cancer 03/29/2017  . ICH (intracerebral hemorrhage) ([place]) 07/14/2016  . Portal vein thrombosis 07/14/2016  . Short-term memory loss 07/14/2016  . Brain metastases ([place]) 07/14/2016  . Metastatic lung cancer (metastasis from lung to other site) ([place]) 07/13/2016  . Brain metastasis ([place]) 07/13/2016    Sigurd Sos, PT 06/16/19 12:33 PM  [facility] [address], [place], [place], [address] Phone: [phone]   Fax:  [phone]  Name: Jeffery Cobb MRN: [id number] Date of Birth: 02-Aug-1956

## 2019-06-24 ENCOUNTER — Telehealth: Payer: Self-pay | Admitting: Physical Therapy

## 2019-06-24 ENCOUNTER — Ambulatory Visit: Payer: Managed Care, Other (non HMO) | Attending: Orthopedic Surgery | Admitting: Physical Therapy

## 2019-06-24 DIAGNOSIS — M545 Low back pain: Secondary | ICD-10-CM | POA: Insufficient documentation

## 2019-06-24 DIAGNOSIS — G8929 Other chronic pain: Secondary | ICD-10-CM | POA: Insufficient documentation

## 2019-06-24 DIAGNOSIS — M25551 Pain in right hip: Secondary | ICD-10-CM | POA: Insufficient documentation

## 2019-06-24 DIAGNOSIS — R2689 Other abnormalities of gait and mobility: Secondary | ICD-10-CM | POA: Insufficient documentation

## 2019-06-24 DIAGNOSIS — R252 Cramp and spasm: Secondary | ICD-10-CM | POA: Insufficient documentation

## 2019-06-24 DIAGNOSIS — M6281 Muscle weakness (generalized): Secondary | ICD-10-CM | POA: Insufficient documentation

## 2019-06-24 NOTE — Telephone Encounter (Signed)
Called patient and left a message.  Earlie Counts, PT @11 /11/2018@ 11:06 AM

## 2019-07-01 ENCOUNTER — Encounter: Payer: Self-pay | Admitting: Physical Therapy

## 2019-07-01 ENCOUNTER — Ambulatory Visit: Payer: Managed Care, Other (non HMO) | Admitting: Physical Therapy

## 2019-07-01 ENCOUNTER — Other Ambulatory Visit: Payer: Self-pay

## 2019-07-01 DIAGNOSIS — M25551 Pain in right hip: Secondary | ICD-10-CM

## 2019-07-01 DIAGNOSIS — R252 Cramp and spasm: Secondary | ICD-10-CM

## 2019-07-01 DIAGNOSIS — M6281 Muscle weakness (generalized): Secondary | ICD-10-CM

## 2019-07-01 DIAGNOSIS — G8929 Other chronic pain: Secondary | ICD-10-CM

## 2019-07-01 DIAGNOSIS — M545 Low back pain, unspecified: Secondary | ICD-10-CM

## 2019-07-01 DIAGNOSIS — R2689 Other abnormalities of gait and mobility: Secondary | ICD-10-CM | POA: Diagnosis present

## 2019-07-01 NOTE — Therapy (Signed)
[facility] [address] [place], [place], [address] Phone: [phone]   Fax:  [phone]  Physical Therapy Treatment  Patient Details  Name: Jeffery Cobb MRN: [id number] Date of Birth: 1956-07-11 Referring Provider (PT): Cherre Blanc Mayford Knife, MD   Encounter Date: 07/01/2019  PT End of Session - 07/01/19 1100    Visit Number  7    Date for PT Re-Evaluation  07/28/19    Authorization Type  [employer]    PT Start Time  1015    PT Stop Time  1055    PT Time Calculation (min)  40 min    Activity Tolerance  Patient tolerated treatment well;No increased pain    Behavior During Therapy  [facility] for tasks assessed/performed       Past Medical History:  Diagnosis Date  . Cancer ([place])    lung  . Depression     Past Surgical History:  Procedure Laterality Date  . JOINT REPLACEMENT Left 6.20/2018   hip    There were no vitals filed for this visit.  Subjective Assessment - 07/01/19 1021    Subjective  My back is feeling better and the right hip is bothering me. When I went to [facility] and the hip looked okay and put an injection to reduce friction in the joint. When I walk my sometimes I have increased pain.    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  Yes    Pain Score  4    worst is 7/10   Pain Location  Hip    Pain Orientation  Right    Pain Descriptors / Indicators  Shooting;Tightness    Pain Type  Chronic pain    Pain Onset  More than a month ago    Pain Frequency  Constant    Aggravating Factors   twisting, sitting long periods of time    Pain Relieving Factors  dry needling, stretches         [facility] PT Assessment - 07/01/19 0001      Assessment   Medical Diagnosis  hip abductor tendinitis    Referring Provider (PT)  Lockie Mola, MD    Onset Date/Surgical Date  10/20/18    Next MD Visit  05/04/19    Prior Therapy  s/p Lt TKA-2 years ago                   [facility] Adult PT Treatment/Exercise - 07/01/19 0001      Manual Therapy   Manual Therapy  Soft tissue mobilization    Manual therapy comments  used assistive device along the scar, gluteals and around the greater trochanter on the right    Soft tissue mobilization  elongation to Rt gluteals, tensor fascia lata, piriformis after dry needling       Trigger Point Dry Needling - 07/01/19 0001    Consent Given?  Yes    Education Handout Provided  Previously provided    Muscles Treated Back/Hip  Gluteus minimus;Gluteus medius;Gluteus maximus;Piriformis;Tensor fascia lata    Gluteus Minimus Response  Twitch response elicited;Palpable increased muscle length   irght   Gluteus Medius Response  Twitch response elicited;Palpable increased muscle length   right   Gluteus Maximus Response  Twitch  response elicited;Palpable increased muscle length   right   Piriformis Response  Twitch response elicited;Palpable increased muscle length   right   Tensor Fascia Lata Response  Twitch response elicited;Palpable increased muscle length   right            PT Short Term Goals - 05/13/19 1022      PT SHORT TERM GOAL #1   Title  independent with intial HEP    Status  Achieved      PT SHORT TERM GOAL #2   Title  report < or = to 4/10 Rt hip pain with sleep at night    Status  Achieved      PT SHORT TERM GOAL #3   Title  report a 30% reduction in Rt hip pain with home and work tasks    Baseline  50%    Status  Achieved        PT Long Term Goals - 07/01/19 1102      PT LONG TERM GOAL #1   Title  independent with HEP and how to progress    Baseline  weight training at the gym    Time  6    Period  Weeks    Status  On-going      PT LONG TERM GOAL #2   Title  report < or = to 2/10 max Rt hip pain at the end of the day    Baseline   5/10    Time  8    Period  Weeks    Status  On-going      PT LONG TERM GOAL #4   Title  demonstrate 4+/5 Rt hip abduction to reduce pain with endurance tasks    Baseline  4/5    Time  6    Period  Weeks    Status  On-going            Plan - 07/01/19 1024    Clinical Impression Statement  Patient reports he does not have the lumbar pain just in the right hip. Patient has decreased movement of the scar tissue from surgery along the tensor fascia lata. Patient is swimming daily with no difficulty. Patient will continue to benefit from skilled therapy to adress tissue mobiity, core and hip strength.    Personal Factors and Comorbidities  Comorbidity 3+    Comorbidities  metastatic lung cancer, Lt hip replacement, ORIF on the Rt    Examination-Activity Limitations  Dressing;Stand    Examination-Participation Restrictions  Community Activity;Yard Work    [employer]  Evolving/Moderate complexity    Rehab Potential  Good    PT Frequency  1x / week    PT Duration  6 weeks    PT Treatment/Interventions  ADLs/Self Care Home Management;Cryotherapy;Gait training;Moist Heat;Functional mobility training;Therapeutic activities;Therapeutic exercise;Neuromuscular re-education;Manual techniques;Passive range of motion;Dry needling;Taping    PT Next Visit Plan  continue dry needling to lumbar multifidi and Rt gluteals, review any HEP as needed, progress strength of core and hip; measure right hip strength    PT Home Exercise Plan  Access Code: [email or web address]    Recommended Other Services  MD signed the notes    Consulted and Agree with Plan of Care  Patient       Patient will benefit from skilled therapeutic intervention in order to improve the following deficits and impairments:  Abnormal gait, Decreased activity tolerance, Decreased strength, Difficulty walking, Impaired flexibility, Increased muscle spasms, Pain  Visit Diagnosis: Muscle weakness (generalized)  Other  abnormalities of gait and mobility  Pain in right hip  Cramp and spasm  Chronic bilateral low back pain without sciatica     Problem List Patient Active Problem List   Diagnosis Date Noted  . Pancolitis ([place]) 03/29/2017  . C. difficile colitis 03/29/2017  . H/O: lung cancer 03/29/2017  . ICH (intracerebral hemorrhage) ([place]) 07/14/2016  . Portal vein thrombosis 07/14/2016  . Short-term memory loss 07/14/2016  . Brain metastases ([place]) 07/14/2016  . Metastatic lung cancer (metastasis from lung to other site) ([place]) 07/13/2016  . Brain metastasis ([place]) 07/13/2016    Earlie Counts, PT 07/01/19 11:08 AM    [facility] [address], [place], [place], [address] Phone: [phone]   Fax:  [phone]  Name: Edis Huish MRN: [id number] Date of Birth: 31-Dec-1955

## 2019-07-08 ENCOUNTER — Ambulatory Visit: Payer: Managed Care, Other (non HMO) | Admitting: Physical Therapy

## 2019-07-08 ENCOUNTER — Encounter: Payer: Self-pay | Admitting: Physical Therapy

## 2019-07-08 ENCOUNTER — Other Ambulatory Visit: Payer: Self-pay

## 2019-07-08 DIAGNOSIS — R2689 Other abnormalities of gait and mobility: Secondary | ICD-10-CM | POA: Diagnosis not present

## 2019-07-08 DIAGNOSIS — M25551 Pain in right hip: Secondary | ICD-10-CM

## 2019-07-08 DIAGNOSIS — G8929 Other chronic pain: Secondary | ICD-10-CM

## 2019-07-08 DIAGNOSIS — R252 Cramp and spasm: Secondary | ICD-10-CM

## 2019-07-08 DIAGNOSIS — M545 Low back pain, unspecified: Secondary | ICD-10-CM

## 2019-07-08 DIAGNOSIS — M6281 Muscle weakness (generalized): Secondary | ICD-10-CM

## 2019-07-08 NOTE — Patient Instructions (Signed)
Access Code: [email or web address]  URL: [email or web address]  Date: 07/08/2019  Prepared by: Earlie Counts   Exercises Single Knee to Chest Stretch - 3 reps - 20 hold - 2x daily - 7x weekly Supine Piriformis Stretch - 3 reps - 1 sets - 20 hold - 3x daily - 7x weekly Seated Piriformis Stretch with Trunk Bend - 3 reps - 1 sets - 20 hold - 3x daily - 7x weekly Seated Hamstring Stretch - 20 reps - 1 sets - 20 hold - 2x daily - 7x weekly Supine Bridge - 10 reps - 2 sets - 5 hold - 2x daily - 7x weekly Clamshell with Resistance - 10 reps - 2 sets - 2x daily - 7x weekly Static Prone on Elbows - 1 reps - 1 sets - 30 sec hold - 1x daily - 7x weekly Prone on Elbows Thoracic Rotation - 2 reps - 1 sets - 10 sec hold - 1x daily - 7x weekly Sidelying Thoracic Lumbar Rotation - 10 reps - 1 sets - 5 sec hold                            - 1x daily - 7x weekly Standing Lumbar Spine Flexion Stretch Counter - 2 reps - 1 sets - 15 sec hold - 1x daily - 7x weekly [facility] [address] [place], [place] [address] Phone # [phone] Fax [phone]

## 2019-07-08 NOTE — Therapy (Signed)
[facility] [address], [place], [place], [address] Phone: [phone]   Fax:  [phone]  Physical Therapy Treatment  Patient Details  Name: Jeffery Cobb MRN: [id number] Date of Birth: 07-28-1956 Referring Provider (PT): Cherre Blanc Mayford Knife, MD   Encounter Date: 07/08/2019  PT End of Session - 07/08/19 1056    Visit Number  8    Date for PT Re-Evaluation  07/28/19    Authorization Type  [employer]    PT Start Time  1015    PT Stop Time  5093    PT Time Calculation (min)  38 min    Activity Tolerance  Patient tolerated treatment well;No increased pain    Behavior During Therapy  WFL for tasks assessed/performed       Past Medical History:  Diagnosis Date  . Cancer ([place])    lung  . Depression     Past Surgical History:  Procedure Laterality Date  . JOINT REPLACEMENT Left 6.20/2018   hip    There were no vitals filed for this visit.  Subjective Assessment - 07/08/19 1017    Subjective  I felt good after last visit. I am having problems in my low back. I have a pain in low back.    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  Yes    Pain Score  4     Pain Location  Hip    Pain Orientation  Right    Pain Descriptors / Indicators  Shooting;Tightness    Pain Type  Chronic pain    Pain Onset  More than a month ago    Pain Frequency  Constant    Aggravating Factors   twisting, sitting long periods of time    Pain Relieving Factors  dry needling, stretches    Multiple Pain Sites  Yes    Pain Score  8    Pain Location  Back    Pain Orientation  Lower    Pain Descriptors / Indicators  Dull;Aching    Pain Type  Acute pain    Pain Onset  In the past 7 days    Pain Frequency  Intermittent    Aggravating Factors   worse in the  mornings    Pain Relieving Factors  swimming         [facility] PT Assessment - 07/08/19 0001      Assessment   Medical Diagnosis  hip abductor tendinitis    Referring Provider (PT)  Lockie Mola, MD    Onset Date/Surgical Date  10/20/18    Next MD Visit  05/04/19    Prior Therapy  s/p Lt TKA-2 years ago      Precautions   Precautions  Other (comment)    Precaution Comments  metastic cancer      Restrictions   Weight Bearing Restrictions  No      [place] residence    Living Arrangements  Other relatives      Prior Function   Level of Independence  Independent    Vocation  Full time employment    Programme researcher, broadcasting/film/video- desk work    Leisure  swimming at aquatic center      [employer]  Overall Cognitive Status  Within Functional Limits for tasks assessed      Posture/Postural Control   Posture/Postural Control  Postural limitations      ROM / Strength   AROM / PROM / Strength  AROM;PROM;Strength                   [facility] Adult PT Treatment/Exercise - 07/08/19 0001      Knee/Hip Exercises: Stretches   Other Knee/Hip Stretches  prone on elbows to stretch the hip and back then lift arm to rotate trunk    Other Knee/Hip Stretches  sidely trunk rotation 10x each side      Manual Therapy   Manual Therapy  Soft tissue mobilization    Soft tissue mobilization  used assistive device to perform soft tissue work to the lumbar paraspinals and bilateral gluteals in prone       Trigger Point Dry Needling - 07/08/19 0001    Consent Given?  Yes    Education Handout Provided  Previously provided    Muscles Treated Back/Hip  Lumbar multifidi    Lumbar multifidi Response  Twitch response elicited;Palpable increased muscle length           PT Education - 07/08/19 1034    Education Details  Access Code: [email or web address]    Person(s) Educated  Patient    Methods  Explanation;Demonstration;Verbal cues;Handout     Comprehension  Verbalized understanding;Returned demonstration       PT Short Term Goals - 05/13/19 1022      PT SHORT TERM GOAL #1   Title  independent with intial HEP    Status  Achieved      PT SHORT TERM GOAL #2   Title  report < or = to 4/10 Rt hip pain with sleep at night    Status  Achieved      PT SHORT TERM GOAL #3   Title  report a 30% reduction in Rt hip pain with home and work tasks    Baseline  50%    Status  Achieved        PT Long Term Goals - 07/08/19 1100      PT LONG TERM GOAL #1   Title  independent with HEP and how to progress    Baseline  weight training at the gym    Time  6    Period  Weeks    Status  On-going      PT LONG TERM GOAL #2   Title  report < or = to 2/10 max Rt hip pain at the end of the day    Baseline  5/10    Time  8    Period  Weeks    Status  On-going      PT LONG TERM GOAL #3   Title  reduce FOTO to < or = to 23% limitation    Time  6    Period  Weeks    Status  On-going      PT LONG TERM GOAL #4   Title  demonstrate 4+/5 Rt hip abduction to reduce pain with endurance tasks    Baseline  4/5    Time  6    Period  Weeks    Status  On-going            Plan - 07/08/19 1026    Clinical Impression Statement  Patient had increased back pain today and difficulty with movement. Patient had  limited lumbar ROM. After  manual work patient had increased movement and less pain. Patient had several trigger points in the lumbar. Patient has learned stretches for his back with keeping in his hip replacement restrictions. Patient will benefit from skilled PT to improve mobility and reduce pain.    Personal Factors and Comorbidities  Comorbidity 3+    Comorbidities  metastatic lung cancer, Lt hip replacement, ORIF on the Rt    Examination-Activity Limitations  Dressing;Stand    Examination-Participation Restrictions  Community Activity;Yard Work    [employer]chant [employer] officer  Evolving/Moderate complexity    Rehab Potential   Good    PT Frequency  1x / week    PT Duration  6 weeks    PT Treatment/Interventions  ADLs/Self Care Home Management;Cryotherapy;Gait training;Moist Heat;Functional mobility training;Therapeutic activities;Therapeutic exercise;Neuromuscular re-education;Manual techniques;Passive range of motion;Dry needling;Taping    PT Next Visit Plan  determine if patient is continuing with therapy    PT Home Exercise Plan  Access Code: [email or web address]    Consulted and Agree with Plan of Care  Patient       Patient will benefit from skilled therapeutic intervention in order to improve the following deficits and impairments:  Abnormal gait, Decreased activity tolerance, Decreased strength, Difficulty walking, Impaired flexibility, Increased muscle spasms, Pain  Visit Diagnosis: Muscle weakness (generalized)  Other abnormalities of gait and mobility  Pain in right hip  Cramp and spasm  Chronic bilateral low back pain without sciatica     Problem List Patient Active Problem List   Diagnosis Date Noted  . Pancolitis ([place]) 03/29/2017  . C. difficile colitis 03/29/2017  . H/O: lung cancer 03/29/2017  . ICH (intracerebral hemorrhage) ([place]) 07/14/2016  . Portal vein thrombosis 07/14/2016  . Short-term memory loss 07/14/2016  . Brain metastases ([place]) 07/14/2016  . Metastatic lung cancer (metastasis from lung to other site) ([place]) 07/13/2016  . Brain metastasis ([place]) 07/13/2016    Earlie Counts, PT 07/08/19 11:02 AM   [facility] [address] [place], [place], [address] Phone: [phone]   Fax:  [phone]  Name: Sabas Frett MRN: [id number] Date of Birth: [birth year]

## 2019-07-15 ENCOUNTER — Encounter: Payer: Self-pay | Admitting: Physical Therapy

## 2019-07-15 ENCOUNTER — Ambulatory Visit: Payer: Managed Care, Other (non HMO) | Admitting: Physical Therapy

## 2019-07-15 ENCOUNTER — Other Ambulatory Visit: Payer: Self-pay

## 2019-07-15 DIAGNOSIS — R2689 Other abnormalities of gait and mobility: Secondary | ICD-10-CM

## 2019-07-15 DIAGNOSIS — G8929 Other chronic pain: Secondary | ICD-10-CM

## 2019-07-15 DIAGNOSIS — M545 Low back pain, unspecified: Secondary | ICD-10-CM

## 2019-07-15 DIAGNOSIS — M25551 Pain in right hip: Secondary | ICD-10-CM

## 2019-07-15 DIAGNOSIS — R252 Cramp and spasm: Secondary | ICD-10-CM

## 2019-07-15 NOTE — Therapy (Signed)
[facility] [address], [place], [place], [address] Phone: [phone]   Fax:  [phone]  Physical Therapy Treatment  Patient Details  Name: Jeffery Cobb MRN: [id number] Date of Birth: November 11, 1955 Referring Provider (PT): Cherre Blanc Mayford Knife, MD   Encounter Date: 07/15/2019  PT End of Session - 07/15/19 1059    Visit Number  9    Date for PT Re-Evaluation  07/28/19    Authorization Type  [employer]    PT Start Time  1015    PT Stop Time  1055    PT Time Calculation (min)  40 min    Activity Tolerance  Patient tolerated treatment well;No increased pain    Behavior During Therapy  WFL for tasks assessed/performed       Past Medical History:  Diagnosis Date  . Cancer ([place])    lung  . Depression     Past Surgical History:  Procedure Laterality Date  . JOINT REPLACEMENT Left 6.20/2018   hip    There were no vitals filed for this visit.  Subjective Assessment - 07/15/19 1024    Subjective  My left hip cramps up. It has been doing the cramping up alot more. It happens when I get in the car and twist Patient is having back pain and will go see his PCP. No right hip pain.    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  Yes    Pain Score  6     Pain Location  Back    Pain Orientation  Lower    Pain Descriptors / Indicators  Aching;Dull;Sharp    Pain Type  Chronic pain    Pain Onset  More than a month ago    Pain Frequency  Constant    Aggravating Factors   stationary    Pain Relieving Factors  dry needling, stretches    Multiple Pain Sites  No         [facility] PT Assessment - 07/15/19 0001      Assessment   Medical Diagnosis  hip abductor tendinitis    Referring Provider (PT)  Lockie Mola, MD    Onset  Date/Surgical Date  10/20/18    Next MD Visit  05/04/19    Prior Therapy  s/p Lt TKA-2 years ago      Precautions   Precautions  Other (comment)    Precaution Comments  metastic cancer      Restrictions   Weight Bearing Restrictions  No      [place] residence    Living Arrangements  Other relatives      Prior Function   Level of Independence  Independent    Vocation  Full time employment    Programme researcher, broadcasting/film/video- desk work    Leisure  swimming at aquatic center      Cognition   Overall Cognitive Status  Within Functional Limits for tasks assessed      Strength   Right Hip Flexion  4+/5    Right Hip Extension  4+/5    Right Hip External Rotation   5/5    Right Hip Internal Rotation  5/5    Right Hip ABduction  4+/5  Right Hip ADduction  5/5    Left Hip Flexion  4/5    Left Hip Extension  4/5    Left Hip External Rotation  4-/5    Left Hip Internal Rotation  5/5    Left Hip ABduction  4+/5    Left Hip ADduction  5/5    Right Knee Flexion  4/5    Right Knee Extension  5/5    Left Knee Flexion  4/5    Left Knee Extension  4+/5                   [facility] Adult PT Treatment/Exercise - 07/15/19 0001      Knee/Hip Exercises: Stretches   Piriformis Stretch  Left;Right;3 reps;20 seconds      Knee/Hip Exercises: Standing   Other Standing Knee Exercises  standing lumbar extension with mulligan belt to mobilization the spine      Knee/Hip Exercises: Prone   Hip Extension  Strengthening;Right;Left;1 set;15 reps    Hip Extension Limitations  with knee bent      Manual Therapy   Manual Therapy  Joint mobilization    Manual therapy comments  manually stretched left hip flexor; right knee to chest holding the left leg down to stretch left hip flexors    Joint Mobilization  P-A and rotational mobilization to T8-L5 to increase trunk strength; used Mulligan belt to distract, inferior glide, and lateral glide while stretching  right hip               PT Short Term Goals - 05/13/19 1022      PT SHORT TERM GOAL #1   Title  independent with intial HEP    Status  Achieved      PT SHORT TERM GOAL #2   Title  report < or = to 4/10 Rt hip pain with sleep at night    Status  Achieved      PT SHORT TERM GOAL #3   Title  report a 30% reduction in Rt hip pain with home and work tasks    Baseline  50%    Status  Achieved        PT Long Term Goals - 07/15/19 1055      PT LONG TERM GOAL #1   Title  independent with HEP and how to progress    Time  6    Period  Weeks    Status  Achieved      PT LONG TERM GOAL #2   Title  report < or = to 2/10 max Rt hip pain at the end of the day    Time  8    Period  Weeks    Status  Achieved      PT LONG TERM GOAL #3   Title  reduce FOTO to < or = to 23% limitation    Time  6    Period  Weeks    Status  Deferred   forgot to do the FOTO     PT LONG TERM GOAL #4   Title  demonstrate 4+/5 Rt hip abduction to reduce pain with endurance tasks    Time  6    Period  Weeks    Status  Achieved            Plan - 07/15/19 1035    Clinical Impression Statement  Patient reports no right hip pain. Patient is able to workout at the gym with no right hip pain. Patient  is having back pain and will have the doctor assess it due to his history. Patient reports a cramp in the left hip that could be from his back or due to the surgeries on the left hip. Patient has a tight left quad .Patient has improved left hip mobility and strength. Patient is ready for discharge.    Personal Factors and Comorbidities  Comorbidity 3+    Comorbidities  metastatic lung cancer, Lt hip replacement, ORIF on the Rt    Examination-Activity Limitations  Dressing;Stand    Examination-Participation Restrictions  Community Activity;Yard Work    [employer]chant [employer] officer  Evolving/Moderate complexity    Rehab Potential  Good    PT Frequency  1x / week    PT Duration  6 weeks    PT  Treatment/Interventions  ADLs/Self Care Home Management;Cryotherapy;Gait training;Moist Heat;Functional mobility training;Therapeutic activities;Therapeutic exercise;Neuromuscular re-education;Manual techniques;Passive range of motion;Dry needling;Taping    PT Next Visit Plan  discharge to HEP    PT Home Exercise Plan  Access Code: [email or web address]    Consulted and Agree with Plan of Care  Patient       Patient will benefit from skilled therapeutic intervention in order to improve the following deficits and impairments:  Abnormal gait, Decreased activity tolerance, Decreased strength, Difficulty walking, Impaired flexibility, Increased muscle spasms, Pain  Visit Diagnosis: Other abnormalities of gait and mobility  Pain in right hip  Cramp and spasm  Chronic bilateral low back pain without sciatica     Problem List Patient Active Problem List   Diagnosis Date Noted  . Pancolitis ([place]) 03/29/2017  . C. difficile colitis 03/29/2017  . H/O: lung cancer 03/29/2017  . ICH (intracerebral hemorrhage) ([place]) 07/14/2016  . Portal vein thrombosis 07/14/2016  . Short-term memory loss 07/14/2016  . Brain metastases ([place]) 07/14/2016  . Metastatic lung cancer (metastasis from lung to other site) ([place]) 07/13/2016  . Brain metastasis ([place]) 07/13/2016    Jeffery Cobb 07/15/2019, 10:59 AM  [facility] [address] [place], [place], [address] Phone: [phone]   Fax:  [phone]  Name: Jeffery Cobb MRN: [id number] Date of Birth: 05-27-1956  PHYSICAL THERAPY DISCHARGE SUMMARY  Visits from Start of Care: 9  Current functional level related to goals / functional outcomes: See above.    Remaining deficits: See above.   Education / Equipment: HEP Plan: Patient agrees to discharge.  Patient goals were met. Patient is being discharged due to meeting the stated rehab goals.  Thank you for the referral. Earlie Counts, PT 07/15/19  11:00 AM  ?????

## 2019-08-26 ENCOUNTER — Ambulatory Visit: Payer: Managed Care, Other (non HMO) | Attending: Family Medicine | Admitting: Physical Therapy

## 2019-08-26 ENCOUNTER — Encounter: Payer: Self-pay | Admitting: Physical Therapy

## 2019-08-26 ENCOUNTER — Other Ambulatory Visit: Payer: Self-pay

## 2019-08-26 DIAGNOSIS — G8929 Other chronic pain: Secondary | ICD-10-CM | POA: Diagnosis present

## 2019-08-26 DIAGNOSIS — M62830 Muscle spasm of back: Secondary | ICD-10-CM | POA: Diagnosis present

## 2019-08-26 DIAGNOSIS — M6281 Muscle weakness (generalized): Secondary | ICD-10-CM | POA: Insufficient documentation

## 2019-08-26 DIAGNOSIS — M545 Low back pain: Secondary | ICD-10-CM | POA: Diagnosis not present

## 2019-08-26 NOTE — Therapy (Signed)
[facility] [address], [place], [place], [address] Phone: [phone]   Fax:  [phone]  Physical Therapy Evaluation  Patient Details  Name: Jeffery Cobb MRN: [id number] Date of Birth: 1956/02/02 Referring Provider (PT): Dr. Lujean Amel   Encounter Date: 08/26/2019  PT End of Session - 08/26/19 1114    Visit Number  1    Date for PT Re-Evaluation  10/07/19    Authorization Type  [employer]    PT Start Time  0930    PT Stop Time  1010    PT Time Calculation (min)  40 min    Activity Tolerance  Patient tolerated treatment well;No increased pain    Behavior During Therapy  WFL for tasks assessed/performed       Past Medical History:  Diagnosis Date  . Cancer ([place])    lung  . Depression     Past Surgical History:  Procedure Laterality Date  . JOINT REPLACEMENT Left 6.20/2018   hip    There were no vitals filed for this visit.   Subjective Assessment - 08/26/19 0938    Subjective  After therapy was done my back was so bad and I needed medication. I started to workout and I over did it. I was having more pain. The stretching felt better than the dry needling.    Pertinent History  metastatic lung cancer (no longer being treated), Lt THA (posterior approach), ORIF Rt hip    Diagnostic tests  x-ray: mild OA in the Rt hip    Patient Stated Goals  learn ways to manage pain with exericse and stretching    Currently in Pain?  Yes    Pain Score  4    low 2/10   Pain Location  Back    Pain Orientation  Lower    Pain Descriptors / Indicators  Dull    Pain Type  Chronic pain    Pain Onset  More than a month ago    Pain Frequency  Constant    Aggravating Factors   sitting too long    Pain Relieving Factors  dry needling, stretches    Multiple Pain Sites  No         [facility] PT Assessment - 08/26/19 0001      Assessment   Medical Diagnosis  M54.5 low back pain radiating to lower extremity    Referring  Provider (PT)  Dr. Lujean Amel    Prior Therapy  s/p Lt TKA-2 years ago      Precautions   Precautions  Other (comment)    Precaution Comments  metastic cancer      Restrictions   Weight Bearing Restrictions  No      Balance Screen   Has the patient fallen in the past 6 months  No    Has the patient had a decrease in activity level because of a fear of falling?   No    Is the patient reluctant to leave their home because of a fear of falling?   No      Home Film/video editor residence    Living Arrangements  Other relatives      Prior Function   Level of Independence  Independent    Vocation  Full time employment    Programme researcher, broadcasting/film/video- desk work    Leisure  swimming at aquatic center      Cognition   Overall Cognitive Status  Within  Functional Limits for tasks assessed      Observation/Other Assessments   Focus on Therapeutic Outcomes (FOTO)   48% limitation; 29% limitation      ROM / Strength   AROM / PROM / Strength  AROM;PROM;Strength      AROM   Overall AROM Comments  tightness in the right lumbar sacral area    Lumbar Flexion  full with pain    Lumbar Extension  decreased by 25%    Lumbar - Right Side Bend  decreased by 25%    Lumbar - Left Side Bend  decreased by 25%    Lumbar - Right Rotation  decreased by 25%    Lumbar - Left Rotation  decreased by 25%      Strength   Right Hip Flexion  5/5    Right Hip Extension  3+/5    Right Hip External Rotation   5/5    Right Hip Internal Rotation  5/5    Right Hip ABduction  4/5    Right Hip ADduction  4/5    Left Hip Flexion  5/5    Left Hip Extension  3+/5    Left Hip External Rotation  4-/5    Left Hip Internal Rotation  4-/5    Left Hip ABduction  4-/5    Left Hip ADduction  4/5                Objective measurements completed on examination: See above findings.        Trigger Point Dry Needling - 08/26/19 0001    Consent Given?  Yes    Education Handout  Provided  Previously provided    Muscles Treated Back/Hip  Lumbar multifidi   right side   Lumbar multifidi Response  Twitch response elicited;Palpable increased muscle length             PT Short Term Goals - 08/26/19 1116      PT SHORT TERM GOAL #1   Title  independent with intial HEP    Time  3    Period  Weeks    Status  New    Target Date  09/16/19      PT SHORT TERM GOAL #2   Title  report < or = to 4/10 lumbar pain with exercise    Time  3    Period  Weeks    Status  New    Target Date  09/16/19      PT SHORT TERM GOAL #3   Title  --    Baseline  --        PT Long Term Goals - 08/26/19 1007      PT LONG TERM GOAL #1   Title  independent with HEP and how to progress including aquatic program    Baseline  --    Time  6    Period  Weeks    Status  New    Target Date  10/07/19      PT LONG TERM GOAL #2   Title  return to swimming regularly with minimal  back pain    Baseline  --    Time  6    Period  Weeks    Status  New    Target Date  10/07/19      PT LONG TERM GOAL #3   Title  reduce FOTO to < or = to 29% limitation    Time  6    Period  Weeks  Status  New    Target Date  10/07/19      PT LONG TERM GOAL #4   Title  understand ways to manage pain with home TENS unit, using device to massage muscles and meditation    Time  6    Period  Weeks    Status  New    Target Date  10/21/19             Plan - 08/26/19 1011    Clinical Impression Statement  Patient is a 64 year old male with chronic back pain and history of metastatic lung cancer and left hip replacement and ORIF of right hip. Patient most recent flare-up was in 07/2019. Patient pain prevented him from his swimming program. Patient reports constant pain at 2-4/10 in low back. Patient lumbar ROM is limited by 25% and pain. Patient has tightness in the right lumbar paraspinals and decreased L1-L5 mobility. Bilateral hip strength averages 3-4/5. Patient will benefit from skilled  therapy to educate on managing pain, exercises for the core and hips and manual skills to improve lumbar mobilty and reduce spasms.    Personal Factors and Comorbidities  Comorbidity 3+;Fitness    Comorbidities  metastatic lung cancer, Lt hip replacement, ORIF on the Rt    Examination-Activity Limitations  Dressing;Stand    Examination-Participation Restrictions  Community Activity    Stability/Clinical Decision Making  Evolving/Moderate complexity    Clinical Decision Making  Moderate    Rehab Potential  Good    PT Frequency  2x / week    PT Duration  6 weeks    PT Treatment/Interventions  Electrical Stimulation;Cryotherapy;Iontophoresis 4mg /ml Dexamethasone;Moist Heat;Traction;Therapeutic activities;Therapeutic exercise;Neuromuscular re-education;Manual techniques;Patient/family education;Dry needling;Spinal Manipulations;Taping;Aquatic Therapy    PT Next Visit Plan  aquatic therapy to work on core strength in an unweighted position, dry needling to lumbar, joint mobilization to lumbar, core and hip strength    PT Home Exercise Plan  Access Code: [email or web address]    Consulted and Agree with Plan of Care  Patient       Patient will benefit from skilled therapeutic intervention in order to improve the following deficits and impairments:  Decreased range of motion, Increased fascial restricitons, Pain, Decreased mobility, Decreased strength  Visit Diagnosis: Chronic bilateral low back pain without sciatica - Plan: PT plan of care cert/re-cert  Muscle weakness (generalized) - Plan: PT plan of care cert/re-cert  Muscle spasm of back - Plan: PT plan of care cert/re-cert     Problem List Patient Active Problem List   Diagnosis Date Noted  . Pancolitis ([place]) 03/29/2017  . C. difficile colitis 03/29/2017  . H/O: lung cancer 03/29/2017  . ICH (intracerebral hemorrhage) ([place]) 07/14/2016  . Portal vein thrombosis 07/14/2016  . Short-term memory loss 07/14/2016  . Brain metastases ([employer]) 07/14/2016   . Metastatic lung cancer (metastasis from lung to other site) ([place]) 07/13/2016  . Brain metastasis ([place]) 07/13/2016    Earlie Counts, PT 08/26/19 11:47 AM   [facility] [address] [place], [place], [address] Phone: [phone]   Fax:  [phone]  Name: Burch Marchuk MRN: [id number] Date of Birth: 1956/01/11

## 2020-08-25 ENCOUNTER — Encounter (HOSPITAL_COMMUNITY): Payer: Self-pay | Admitting: Emergency Medicine

## 2020-08-25 ENCOUNTER — Emergency Department (HOSPITAL_COMMUNITY)
Admission: EM | Admit: 2020-08-25 | Discharge: 2020-08-25 | Disposition: A | Discharge status: Home / Self Care | Payer: Managed Care, Other (non HMO) | Attending: Emergency Medicine | Admitting: Emergency Medicine

## 2020-08-25 ENCOUNTER — Emergency Department (HOSPITAL_COMMUNITY): Payer: Managed Care, Other (non HMO)

## 2020-08-25 DIAGNOSIS — S32029A Unspecified fracture of second lumbar vertebra, initial encounter for closed fracture: Secondary | ICD-10-CM | POA: Insufficient documentation

## 2020-08-25 DIAGNOSIS — X58XXXA Exposure to other specified factors, initial encounter: Secondary | ICD-10-CM | POA: Insufficient documentation

## 2020-08-25 DIAGNOSIS — Z85118 Personal history of other malignant neoplasm of bronchus and lung: Secondary | ICD-10-CM | POA: Insufficient documentation

## 2020-08-25 DIAGNOSIS — Z87891 Personal history of nicotine dependence: Secondary | ICD-10-CM | POA: Insufficient documentation

## 2020-08-25 DIAGNOSIS — S32020A Wedge compression fracture of second lumbar vertebra, initial encounter for closed fracture: Secondary | ICD-10-CM

## 2020-08-25 DIAGNOSIS — S39012A Strain of muscle, fascia and tendon of lower back, initial encounter: Secondary | ICD-10-CM

## 2020-08-25 DIAGNOSIS — S3992XA Unspecified injury of lower back, initial encounter: Secondary | ICD-10-CM | POA: Diagnosis present

## 2020-08-25 LAB — CBC WITH DIFFERENTIAL/PLATELET
Abs Immature Granulocytes: 0.03 10*3/uL (ref 0.00–0.07)
Basophils Absolute: 0 10*3/uL (ref 0.0–0.1)
Basophils Relative: 1 %
Eosinophils Absolute: 0.1 10*3/uL (ref 0.0–0.5)
Eosinophils Relative: 1 %
HCT: 48.7 % (ref 39.0–52.0)
Hemoglobin: 16.8 g/dL (ref 13.0–17.0)
Immature Granulocytes: 0 %
Lymphocytes Relative: 16 %
Lymphs Abs: 1.4 10*3/uL (ref 0.7–4.0)
MCH: 31.5 pg (ref 26.0–34.0)
MCHC: 34.5 g/dL (ref 30.0–36.0)
MCV: 91.2 fL (ref 80.0–100.0)
Monocytes Absolute: 0.7 10*3/uL (ref 0.1–1.0)
Monocytes Relative: 8 %
Neutro Abs: 6.3 10*3/uL (ref 1.7–7.7)
Neutrophils Relative %: 74 %
Platelets: 235 10*3/uL (ref 150–400)
RBC: 5.34 MIL/uL (ref 4.22–5.81)
RDW: 13.2 % (ref 11.5–15.5)
WBC: 8.6 10*3/uL (ref 4.0–10.5)
nRBC: 0 % (ref 0.0–0.2)

## 2020-08-25 LAB — BASIC METABOLIC PANEL
Anion gap: 13 (ref 5–15)
BUN: 20 mg/dL (ref 8–23)
CO2: 27 mmol/L (ref 22–32)
Calcium: 9.1 mg/dL (ref 8.9–10.3)
Chloride: 101 mmol/L (ref 98–111)
Creatinine, Ser: 1.23 mg/dL (ref 0.61–1.24)
GFR, Estimated: >60 mL/min (ref >60)
Glucose, Bld: 85 mg/dL (ref 70–99)
Potassium: 3.3 mmol/L — ABNORMAL LOW (ref 3.5–5.1)
Sodium: 141 mmol/L (ref 135–145)

## 2020-08-25 LAB — CK: Total CK: 157 U/L (ref 49–397)

## 2020-08-25 MED ORDER — FENTANYL CITRATE (PF) 100 MCG/2ML IJ SOLN
100.0000 ug | Freq: Once | INTRAMUSCULAR | Status: AC
  Administered 2020-08-25: 100 ug via INTRAVENOUS
  Filled 2020-08-25: qty 2

## 2020-08-25 MED ORDER — METHOCARBAMOL 1000 MG/10ML IJ SOLN
1000.0000 mg | Freq: Once | INTRAMUSCULAR | Status: DC
  Filled 2020-08-25: qty 10

## 2020-08-25 MED ORDER — METHOCARBAMOL 500 MG PO TABS: 1000.0000 mg | ORAL_TABLET | Freq: Three times a day (TID) | ORAL | 0 refills | Status: DC | PRN

## 2020-08-25 MED ORDER — METHOCARBAMOL 1000 MG/10ML IJ SOLN: 1000.0000 mg | Freq: Once | INTRAMUSCULAR | Status: DC

## 2020-08-25 MED ORDER — POTASSIUM CHLORIDE CRYS ER 20 MEQ PO TBCR
40.0000 meq | EXTENDED_RELEASE_TABLET | Freq: Once | ORAL | Status: AC
  Administered 2020-08-25: 40 meq via ORAL
  Filled 2020-08-25: qty 2

## 2020-08-25 MED ORDER — METHOCARBAMOL 1000 MG/10ML IJ SOLN
1000.0000 mg | Freq: Once | INTRAVENOUS | Status: AC
  Administered 2020-08-25: 1000 mg via INTRAVENOUS
  Filled 2020-08-25: qty 1000

## 2020-08-25 MED ORDER — HYDROCODONE-ACETAMINOPHEN 5-325 MG PO TABS: 1.0000 | ORAL_TABLET | Freq: Four times a day (QID) | ORAL | 0 refills | Status: DC | PRN

## 2020-08-25 MED ORDER — SODIUM CHLORIDE 0.9 % IV BOLUS
1000.0000 mL | Freq: Once | INTRAVENOUS | Status: AC
  Administered 2020-08-25: 1000 mL via INTRAVENOUS

## 2020-08-25 NOTE — Discharge Instructions (Signed)
If you develop worsening, recurrent, or continued back pain, numbness or weakness in the legs, incontinence of your bowels or bladders, numbness of your buttocks, fever, abdominal pain, or any other new/concerning symptoms then return to the ER for evaluation.  

## 2020-08-25 NOTE — ED Provider Notes (Signed)
[facility] DEPT Provider Note   CSN: [id number] Arrival date & time: 08/25/20  0900     History Chief Complaint  Patient presents with  . Back Pain    Jeffery Cobb is a 65 y.o. male.  HPI 65 year old male presents with severe low back pain. It is just right of midline. Started yesterday but acutely worsened after having a bowel movement this morning. Unable to get up to walk due to severe pain so he called EMS. He also tested positive for covid on POC antigen kit. He has no symptoms besides decreased appetite. His son has symptomatic covid which is why he tested himself.  No fevers, urinary symptoms, incontinence, radicular pain, or weakness/numbness in his extremities.  No direct trauma.  He has had back pain in this area before but not this bad.  Has not take anything for the pain.  Feels like it is spasming at certain times, especially with minimal movement   Past Medical History:  Diagnosis Date  . Cancer ([place])    lung  . Depression     Patient Active Problem List   Diagnosis Date Noted  . Pancolitis ([place]) 03/29/2017  . C. difficile colitis 03/29/2017  . H/O: lung cancer 03/29/2017  . ICH (intracerebral hemorrhage) ([place]) 07/14/2016  . Portal vein thrombosis 07/14/2016  . Short-term memory loss 07/14/2016  . Brain metastases ([place]) 07/14/2016  . Metastatic lung cancer (metastasis from lung to other site) ([place]) 07/13/2016  . Brain metastasis ([place]) 07/13/2016    Past Surgical History:  Procedure Laterality Date  . JOINT REPLACEMENT Left 6.20/2018   hip       Family History  Problem Relation Age of Onset  . Lung cancer Mother   . Breast cancer Sister     Social History   Tobacco Use  . Smoking status: Former Smoker    Quit date: 1987    Years since quitting: 35.0  . Smokeless tobacco: Current User  Vaping Use  . Vaping Use: Unknown  Substance Use Topics  . Alcohol use: No  . Drug use: No    Home Medications Prior to  Admission medications   Medication Sig Start Date End Date Taking? Authorizing Provider  HYDROcodone-acetaminophen (NORCO) 5-325 MG tablet Take 1 tablet by mouth every 6 (six) hours as needed. 08/25/20  Yes Sherwood Gambler, MD  methocarbamol (ROBAXIN) 500 MG tablet Take 2 tablets (1,000 mg total) by mouth every 8 (eight) hours as needed for muscle spasms. 08/25/20  Yes Sherwood Gambler, MD  buPROPion (ZYBAN) 150 MG 12 hr tablet Take 150 mg by mouth daily.     [provider]  citalopram (CELEXA) 20 MG tablet Take 20 mg by mouth daily.    [provider]  hydrocortisone (CORTEF) 20 MG tablet Take 20 mg by mouth daily.    [provider]  lamoTRIgine (LAMICTAL) 200 MG tablet Take 200 mg by mouth daily.    [provider]  LORazepam (ATIVAN) 1 MG tablet Take 1 mg by mouth every 8 (eight) hours.    [provider]  potassium chloride 20 MEQ TBCR Take 40 mEq by mouth daily. Patient not taking: Reported on 04/22/2019 04/01/17   Caren Griffins, MD  rosuvastatin (CRESTOR) 5 MG tablet Take 5 mg by mouth daily.    [provider]  saccharomyces boulardii (FLORASTOR) 250 MG capsule Take 1 capsule (250 mg total) by mouth 2 (two) times daily. Patient not taking: Reported on 04/22/2019 04/01/17  Caren Griffins, MD  testosterone cypionate (DEPOTESTOTERONE CYPIONATE) 100 MG/ML injection Inject 200 mg into the muscle every 14 (fourteen) days. For IM use only    [provider]  vancomycin (VANCOCIN) 125 MG capsule Take 1 capsule (125 mg total) by mouth 4 (four) times daily. Patient not taking: Reported on 04/22/2019 04/01/17   Caren Griffins, MD    Allergies    Patient has no known allergies.  Review of Systems   Review of Systems  Constitutional: Negative for fever.  Gastrointestinal: Negative for abdominal pain.  Genitourinary: Negative for dysuria and hematuria.  Musculoskeletal: Positive for back pain.  Neurological: Negative for weakness and  numbness.  All other systems reviewed and are negative.   Physical Exam Updated Vital Signs BP 132/86   Pulse 65   Temp 98.6 F (37 C) (Oral)   Resp 18   SpO2 100%   Physical Exam Vitals and nursing note reviewed.  Constitutional:      General: He is in acute distress (in severe pain).     Appearance: He is well-developed and well-nourished.  HENT:     Head: Normocephalic and atraumatic.     Right Ear: External ear normal.     Left Ear: External ear normal.     Nose: Nose normal.  Eyes:     General:        Right eye: No discharge.        Left eye: No discharge.  Cardiovascular:     Rate and Rhythm: Normal rate and regular rhythm.     Heart sounds: Normal heart sounds.  Pulmonary:     Effort: Pulmonary effort is normal.     Breath sounds: Normal breath sounds.  Abdominal:     General: There is no distension.     Palpations: Abdomen is soft.     Tenderness: There is no abdominal tenderness. There is no right CVA tenderness.  Musculoskeletal:        General: No edema.     Cervical back: Neck supple.     Lumbar back: No tenderness or bony tenderness.       Back:     Comments: Patient indicates pain is just lateral to midline. No reproducible tenderness  Skin:    General: Skin is warm and dry.  Neurological:     Mental Status: He is alert.     Comments: 5/5 strength in BLE. Grossly normal sensation  Psychiatric:        Mood and Affect: Mood is not anxious.     ED Results / Procedures / Treatments   Labs (all labs ordered are listed, but only abnormal results are displayed) Labs Reviewed  BASIC METABOLIC PANEL - Abnormal; Notable for the following components:      Result Value   Potassium 3.3 (*)    All other components within normal limits  CBC WITH DIFFERENTIAL/PLATELET  CK    EKG None  Radiology DG Lumbar Spine Complete  Result Date: 08/25/2020 CLINICAL DATA:  Acute pain X 10 hours.  History of lung carcinoma. EXAM: LUMBAR SPINE - COMPLETE 4+ VIEW  COMPARISON:  CT 03/29/2017 FINDINGS: Mild lumbar levoscoliosis apex L3. Chronic mild compression deformity of L3 with less than 20% loss of height anteriorly. Central compression deformity of the superior endplate of L2, new since previous exam, without any loss of height of the vertebral body. Normal alignment. Small anterior endplate spurs at all lumbar levels. Aortic Atherosclerosis (ICD10-170.0). Left hip arthroplasty hardware partially visualized. IMPRESSION:  1. Central compression deformity of the superior endplate of L2, new since 03/29/2017. 2. Chronic mild L3 compression deformity. Electronically Signed   By: Lucrezia Europe M.D.   On: 08/25/2020 12:53    Procedures Procedures (including critical care time)  Medications Ordered in ED Medications  sodium chloride 0.9 % bolus 1,000 mL (0 mLs Intravenous Stopped 08/25/20 1412)  fentaNYL (SUBLIMAZE) injection 100 mcg (100 mcg Intravenous Given 08/25/20 1308)  methocarbamol (ROBAXIN) 1,000 mg in dextrose 5 % 100 mL IVPB (0 mg Intravenous Stopped 08/25/20 1526)  potassium chloride SA (KLOR-CON) CR tablet 40 mEq (40 mEq Oral Given 08/25/20 1526)    ED Course  I have reviewed the triage vital signs and the nursing notes.  Pertinent labs & imaging results that were available during my care of the patient were reviewed by me and considered in my medical decision making (see chart for details).    MDM Rules/Calculators/A&P                          Patient's pain is significantly better after the IV Robaxin.  Fentanyl partially helped.  This probably is related to muscle spasm.  He has no radicular symptoms or signs/symptoms of CNS emergency.  Given his cancer diagnosis, x-ray was obtained and personally reviewed.  Does seem to show a compression fracture of L2.  Patient thinks this is old, though it is hard to tell.  He will need to follow-up with PCP.  Otherwise, given his pain seems to be controlled and he is now able to get up I think he is stable for  discharge home.  I do not think emergent MRI is warranted.  I doubt urinary cause.  This does not seem to be CVA like a kidney stone or pyelonephritis.  Highly doubt AAA.  Will discharge home with return precautions and follow-up with PCP. Final Clinical Impression(s) / ED Diagnoses Final diagnoses:  Strain of lumbar region, initial encounter  Closed compression fracture of L2 lumbar vertebra, initial encounter [facility])    Rx / DC Orders ED Discharge Orders         Ordered    HYDROcodone-acetaminophen (NORCO) 5-325 MG tablet  Every 6 hours PRN        08/25/20 1432    methocarbamol (ROBAXIN) 500 MG tablet  Every 8 hours PRN        08/25/20 1432           Sherwood Gambler, MD 08/25/20 1612

## 2020-08-25 NOTE — ED Notes (Signed)
Unable to collect vitals at this time patient is out of room.  Will get vitals when patient return.

## 2020-08-25 NOTE — ED Triage Notes (Addendum)
Per EMS-coming from home-back pain for the last 10 hours-covid positive-unable to sit up-has not taken anything for his pain/symptoms-per pt, states he gets treated for his lung cancer at [facility] his TENS unit is not helping-tested positive for covid on Tuesday-no symptoms at this time

## 2020-08-25 NOTE — ED Notes (Signed)
Pt back in room from xray 

## 2020-08-28 ENCOUNTER — Telehealth: Payer: Self-pay | Admitting: Oncology

## 2020-08-28 ENCOUNTER — Other Ambulatory Visit: Payer: Self-pay | Admitting: Oncology

## 2020-08-28 ENCOUNTER — Encounter: Payer: Self-pay | Admitting: Oncology

## 2020-08-28 DIAGNOSIS — U071 COVID-19: Secondary | ICD-10-CM

## 2020-08-28 NOTE — Telephone Encounter (Signed)
I connected by phone with Jeffery Cobb on 08/28/2020 at 1:38 PM to discuss the potential use of a new treatment for mild to moderate COVID-19 viral infection in non-hospitalized patients.  This patient is a 65 y.o. male that meets the FDA criteria for Emergency Use Authorization of COVID monoclonal antibody sotrovimab.  Has a (+) direct SARS-CoV-2 viral test result  Has mild or moderate COVID-19   Is NOT hospitalized due to COVID-19  Is within 10 days of symptom onset  Has at least one of the high risk factor(s) for progression to severe COVID-19 and/or hospitalization as defined in EUA.  Specific high risk criteria : Immunosuppressive Disease or Treatment and Chronic Lung Disease   I have spoken and communicated the following to the patient or parent/caregiver regarding COVID monoclonal antibody treatment:  1. FDA has authorized the emergency use for the treatment of mild to moderate COVID-19 in adults and pediatric patients with positive results of direct SARS-CoV-2 viral testing who are 41 years of age and older weighing at least 40 kg, and who are at high risk for progressing to severe COVID-19 and/or hospitalization.  2. The significant known and potential risks and benefits of COVID monoclonal antibody, and the extent to which such potential risks and benefits are unknown.  3. Information on available alternative treatments and the risks and benefits of those alternatives, including clinical trials.  4. Patients treated with COVID monoclonal antibody should continue to self-isolate and use infection control measures (e.g., wear mask, isolate, social distance, avoid sharing personal items, clean and disinfect "high touch" surfaces, and frequent handwashing) according to CDC guidelines.   5. The patient or parent/caregiver has the option to accept or refuse COVID monoclonal antibody treatment.  After reviewing this information with the patient, the patient has agreed to receive one of  the available covid 19 monoclonal antibodies and will be provided an appropriate fact sheet prior to infusion. Jacquelin Hawking, NP 08/28/2020 1:38 PM

## 2020-08-29 ENCOUNTER — Ambulatory Visit (HOSPITAL_COMMUNITY)
Admission: RE | Admit: 2020-08-29 | Discharge: 2020-08-29 | Disposition: A | Discharge status: Home / Self Care | Payer: Managed Care, Other (non HMO) | Source: Ambulatory Visit | Attending: Pulmonary Disease | Admitting: Pulmonary Disease

## 2020-08-29 DIAGNOSIS — U071 COVID-19: Secondary | ICD-10-CM | POA: Diagnosis present

## 2020-08-29 MED ORDER — METHYLPREDNISOLONE SODIUM SUCC 125 MG IJ SOLR: 125.0000 mg | Freq: Once | INTRAMUSCULAR | Status: DC | PRN

## 2020-08-29 MED ORDER — DIPHENHYDRAMINE HCL 50 MG/ML IJ SOLN: 50.0000 mg | Freq: Once | INTRAMUSCULAR | Status: DC | PRN

## 2020-08-29 MED ORDER — SODIUM CHLORIDE 0.9 % IV SOLN: INTRAVENOUS | Status: DC | PRN

## 2020-08-29 MED ORDER — EPINEPHRINE 0.3 MG/0.3ML IJ SOAJ: 0.3000 mg | Freq: Once | INTRAMUSCULAR | Status: DC | PRN

## 2020-08-29 MED ORDER — SOTROVIMAB 500 MG/8ML IV SOLN
500.0000 mg | Freq: Once | INTRAVENOUS | Status: AC
  Administered 2020-08-29: 500 mg via INTRAVENOUS

## 2020-08-29 MED ORDER — ALBUTEROL SULFATE HFA 108 (90 BASE) MCG/ACT IN AERS: 2.0000 | INHALATION_SPRAY | Freq: Once | RESPIRATORY_TRACT | Status: DC | PRN

## 2020-08-29 MED ORDER — FAMOTIDINE IN NACL 20-0.9 MG/50ML-% IV SOLN: 20.0000 mg | Freq: Once | INTRAVENOUS | Status: DC | PRN

## 2020-08-29 NOTE — Discharge Instructions (Signed)
Patient reviewed Fact Sheet for Patients, Parents, and Caregivers for Emergency Use Authorization (EUA) of sotrovimab for the Treatment of Coronavirus. Patient also reviewed and is agreeable to the estimated cost of treatment. Patient is agreeable to proceed.     If you have any questions or concerns after the infusion please call the Advanced Practice Provider on call at [phone]. This number is ONLY intended for your use regarding questions or concerns about the infusion post-treatment side-effects.  Please do not provide this number to others for use. For return to work notes please contact your primary care provider.   If someone you know is interested in receiving treatment please have them call the [facility] hotline at [phone].   What types of side effects do monoclonal antibody drugs cause?  Common side effects  In general, the more common side effects caused by monoclonal antibody drugs include: . Allergic reactions, such as hives or itching . Flu-like signs and symptoms, including chills, fatigue, fever, and muscle aches and pains . Nausea, vomiting . Diarrhea . Skin rashes . Low blood pressure   The CDC is recommending patients who receive monoclonal antibody treatments wait at least 90 days before being vaccinated.  Currently, there are no data on the safety and efficacy of mRNA COVID-19 vaccines in persons who received monoclonal antibodies or convalescent plasma as part of COVID-19 treatment. Based on the estimated half-life of such therapies as well as evidence suggesting that reinfection is uncommon in the 90 days after initial infection, vaccination should be deferred for at least 90 days, as a precautionary measure until additional information becomes available, to avoid interference of the antibody treatment with vaccine-induced immune responses.    10 Things You Can Do to Manage Your COVID-19 Symptoms at Home If you have possible or confirmed COVID-19: 1. Stay home  except to get medical care. 2. Monitor your symptoms carefully. If your symptoms get worse, call your healthcare provider immediately. 3. Get rest and stay hydrated. 4. If you have a medical appointment, call the healthcare provider ahead of time and tell them that you have or may have COVID-19. 5. For medical emergencies, call 911 and notify the dispatch personnel that you have or may have COVID-19. 6. Cover your cough and sneezes with a tissue or use the inside of your elbow. 7. Wash your hands often with soap and water for at least 20 seconds or clean your hands with an alcohol-based hand sanitizer that contains at least 60% alcohol. 8. As much as possible, stay in a specific room and away from other people in your home. Also, you should use a separate bathroom, if available. If you need to be around other people in or outside of the home, wear a mask. 9. Avoid sharing personal items with other people in your household, like dishes, towels, and bedding. 10. Clean all surfaces that are touched often, like counters, tabletops, and doorknobs. Use household cleaning sprays or wipes according to the label instructions. [email or web address] 03/04/2020 This information is not intended to replace advice given to you by your health care provider. Make sure you discuss any questions you have with your health care provider. Document Revised: 06/20/2020 Document Reviewed: 06/20/2020 Elsevier Patient Education  2021 [employer].

## 2020-08-29 NOTE — Progress Notes (Signed)
Diagnosis: COVID-19  Physician: Dr. Patrick Wright  Procedure: Covid Infusion Clinic Med: Sotrovimab infusion - Provided patient with sotrovimab fact sheet for patients, parents, and caregivers prior to infusion.   Complications: No immediate complications noted  Discharge: Discharged home    

## 2020-09-08 ENCOUNTER — Other Ambulatory Visit: Payer: Self-pay | Admitting: Hematology and Oncology

## 2020-09-08 ENCOUNTER — Other Ambulatory Visit: Payer: Self-pay | Admitting: Family Medicine

## 2020-09-08 DIAGNOSIS — C3431 Malignant neoplasm of lower lobe, right bronchus or lung: Secondary | ICD-10-CM

## 2020-09-08 DIAGNOSIS — C787 Secondary malignant neoplasm of liver and intrahepatic bile duct: Secondary | ICD-10-CM

## 2020-09-08 DIAGNOSIS — S32020D Wedge compression fracture of second lumbar vertebra, subsequent encounter for fracture with routine healing: Secondary | ICD-10-CM

## 2020-09-08 DIAGNOSIS — C774 Secondary and unspecified malignant neoplasm of inguinal and lower limb lymph nodes: Secondary | ICD-10-CM

## 2020-09-08 DIAGNOSIS — C3490 Malignant neoplasm of unspecified part of unspecified bronchus or lung: Secondary | ICD-10-CM

## 2020-09-08 DIAGNOSIS — C7931 Secondary malignant neoplasm of brain: Secondary | ICD-10-CM

## 2020-09-08 DIAGNOSIS — C7951 Secondary malignant neoplasm of bone: Secondary | ICD-10-CM

## 2020-09-13 ENCOUNTER — Inpatient Hospital Stay: Admission: RE | Admit: 2020-09-13 | Payer: Managed Care, Other (non HMO) | Source: Ambulatory Visit

## 2020-09-13 ENCOUNTER — Other Ambulatory Visit: Payer: Managed Care, Other (non HMO)

## 2020-12-01 ENCOUNTER — Encounter: Payer: Self-pay | Admitting: Physical Therapy

## 2020-12-01 ENCOUNTER — Ambulatory Visit: Payer: Managed Care, Other (non HMO) | Attending: Physician Assistant | Admitting: Physical Therapy

## 2020-12-01 ENCOUNTER — Other Ambulatory Visit: Payer: Self-pay

## 2020-12-01 DIAGNOSIS — G8929 Other chronic pain: Secondary | ICD-10-CM | POA: Insufficient documentation

## 2020-12-01 DIAGNOSIS — M545 Low back pain, unspecified: Secondary | ICD-10-CM | POA: Insufficient documentation

## 2020-12-01 DIAGNOSIS — R2689 Other abnormalities of gait and mobility: Secondary | ICD-10-CM | POA: Diagnosis present

## 2020-12-01 DIAGNOSIS — M25551 Pain in right hip: Secondary | ICD-10-CM | POA: Insufficient documentation

## 2020-12-01 DIAGNOSIS — M6281 Muscle weakness (generalized): Secondary | ICD-10-CM | POA: Insufficient documentation

## 2020-12-01 DIAGNOSIS — M62830 Muscle spasm of back: Secondary | ICD-10-CM | POA: Insufficient documentation

## 2020-12-01 DIAGNOSIS — R252 Cramp and spasm: Secondary | ICD-10-CM | POA: Insufficient documentation

## 2020-12-01 NOTE — Patient Instructions (Signed)
     [facility] Welcome to [facility]! Here you will find all the information you will need regarding your pool therapy. If you have further questions at any time, please call our office at [phone]. After completing your initial evaluation in the [facility], you may be eligible to complete a portion of your therapy in the pool. A typical week of therapy will consist of 1-2 typical physical therapy visits at our [facility] location and an additional session of therapy in the pool located at the [facility] at [address]. [address], [place] [address]. The phone number at the pool site is [phone]. Please call this number if you are running late or need to cancel your appointment.  Aquatic therapy will be offered on Friday afternoons. Each session will last approximately 45 minutes. All scheduling and payments for aquatic therapy sessions, including cancelations, will be done through our [facility] location.  To be eligible for aquatic therapy, these criteria must be met: . You must be able to independently change in the locker room and get to the pool deck. A caregiver can come with you to help if needed. There are bleachers for a caregiver to sit on next to the pool. . No one with an open wound is permitted in the pool.  Handicap parking is available in the front and there is a drop off option for even closer accessibility. Please arrive 15 minutes prior to your appointment to prepare for your pool session. You must sign in at the front desk upon your arrival. Please be sure to attend to any toileting needs prior to entering the pool. Locker rooms for changing are located to the right of the check-in desk. There is direct access to the pool deck from the locker room. You can lock your belongings in a locker but must bring a lock. Your therapist will greet you on the pool deck. There may be other swimmers in the pool at  the same time but your session is one-on-one with the therapist.   

## 2020-12-01 NOTE — Therapy (Signed)
[facility] [address], [place], [place], [address] Phone: [phone]   Fax:  [phone]  Physical Therapy Evaluation  Patient Details  Name: Jeffery Cobb MRN: [id number] Date of Birth: Sep 28, 1955 Referring Provider (PT): Towanda Malkin PA   Encounter Date: 12/01/2020   PT End of Session - 12/01/20 1149    Visit Number 1    Date for PT Re-Evaluation 02/23/21    Authorization Type [employer]    PT Start Time 0807    PT Stop Time 0844    PT Time Calculation (min) 37 min    Activity Tolerance Patient tolerated treatment well           Past Medical History:  Diagnosis Date  . Cancer ([place])    lung  . Depression     Past Surgical History:  Procedure Laterality Date  . JOINT REPLACEMENT Left 6.20/2018   hip    There were no vitals filed for this visit.    Subjective Assessment - 12/01/20 0810    Subjective Ongoing low back pain; hospitalized in Feb b/c of it secondary to pain and spasms;   had DN in past but very painful; interested in aquatic ex (I swim every other day)    Pertinent History on prednisone ("[facility] said recent bone density was fine" ;  stage 4 metastatic lung cancer, adrenal gland damaged;  Lt THA (posterior approach), QUAD damage related to turmor;  ORIF Rt hip;  GOES BY Jeffery Cobb    Limitations Walking;House hold activities    How long can you sit comfortably? as long as I want    How long can you stand comfortably? uses walker sometimes (walks dog 2x/day)    How long can you walk comfortably? 15 minutes    Diagnostic tests L2-3 herniated discs; mild OA right hip    Patient Stated Goals live without pain; do everyday activities;  I'm just trying to survive    Currently in Pain? Yes    Pain Score 5     Pain Location Back    Pain Orientation Right    Pain Type Chronic pain    Aggravating Factors  reaching awkwardly; lifting    Pain Relieving Factors swim with pool buoy              [facility] PT  Assessment - 12/01/20 0001      Assessment   Medical Diagnosis lumbar disc disease with radiculopathy    Referring Provider (PT) Towanda Malkin PA    Prior Therapy for LBP 2020 had DN but really painful      Precautions   Precautions Other (comment)    Precaution Comments cancer; prednisone use      Restrictions   Weight Bearing Restrictions No      Balance Screen   Has the patient fallen in the past 6 months Yes    How many times? related to left quad not working 1x    Has the patient had a decrease in activity level because of a fear of falling?  No    Is the patient reluctant to leave their home because of a fear of falling?  No      Prior Function   Vocation Retired    [employer] went back 1 year ago but getting ready to quit    Leisure swimming at aquatic center      Observation/Other Assessments   Focus on Therapeutic Outcomes (FOTO)  47% moderate difficulty performing usual work or  household activities      Posture/Postural Control   Posture/Postural Control Postural limitations    Postural Limitations Decreased lumbar lordosis      AROM   Overall AROM Comments bridge painful; SKTC to 95 bil    Lumbar Flexion 60    Lumbar Extension 20    Lumbar - Right Side Bend 30    Lumbar - Left Side Bend 30      Strength   Overall Strength Comments grossly 4+/5 except right hip abuction 4-/5 and 3+/5 left quads      Flexibility   Hamstrings 40 left; 60 right      Palpation   Palpation comment decreased gluteal and thoracolumbar fascial soft tissue mobility      Slump test   Findings Negative      Straight Leg Raise   Findings Negative                      Objective measurements completed on examination: See above findings.               PT Education - 12/01/20 1149    Education Details pool info    Person(s) Educated Patient    Methods Explanation;Handout            PT Short Term Goals - 12/01/20 1206      PT SHORT TERM  GOAL #1   Title independent with intial HEP to maintain mobility and promote pain relief    Time 6    Period Weeks    Status New    Target Date 01/12/21      PT SHORT TERM GOAL #2   Title report < or = to 4/10 lumbar pain with exercise    Time 6    Period Weeks    Status New             PT Long Term Goals - 12/01/20 1207      PT LONG TERM GOAL #1   Title independent with HEP and how to progress including aquatic program    Time 12    Period Weeks    Status New    Target Date 02/23/21      PT LONG TERM GOAL #2   Title The patient will report overall improvement in quality of life, function and pain control by 25%    Time 12    Period Weeks    Status New      PT LONG TERM GOAL #3   Title FOTO score improved from 47% to 62%    Time 12    Period Weeks    Status New      PT LONG TERM GOAL #4   Title understand ways to manage pain with home TENS unit, using device to massage muscles and meditation    Time 12    Period Weeks    Status New                  Plan - 12/01/20 1150    Clinical Impression Statement The patient is a 65 year old male with chronic back pain and history of metastatic lung cancer and left hip replacment and ORIF of right hip.  He reports he had a flare up in February with hospitalization due to pain and spasm.  The pain is primarily in the right lumbar region and can be aggravated by reaching awkwardly or lifting.  He states he has begun swimming which has helped  him feel better.  He has been referred to PT for aquatic PT but is he also receptive to land based PT for pain management.   He had DN in the past which was helpful but really painful.  He states he's is looking for comfort/pain relief at this point and basically "just trying to survive".  Moderate hip and lumbar joint stiffness and movement loss.  Decreased lumbar lordosis.  Decreased gluteal and thoracolumbar fascial mobility.  Negative neural signs.  He would benefit from PT  intervention for pain management focused care.    Personal Factors and Comorbidities Comorbidity 3+    Comorbidities metastatic lung cancer, Lt hip replacement, ORIF on the Rt; on prednisone (daily)    Examination-Activity Limitations Dressing;Stand;Lift;Reach Overhead;Locomotion Level    Examination-Participation Restrictions Community Activity;Occupation    Stability/Clinical Decision Making Evolving/Moderate complexity    Clinical Decision Making Moderate    Rehab Potential Good    PT Frequency 2x / week    PT Duration 12 weeks    PT Treatment/Interventions Electrical Stimulation;Cryotherapy;Iontophoresis 4mg /ml Dexamethasone;Moist Heat;Therapeutic activities;Therapeutic exercise;Neuromuscular re-education;Manual techniques;Patient/family education;Dry needling;Spinal Manipulations;Taping;Aquatic Therapy    PT Next Visit Plan aquatic therapy as available (waiting list);  Addaday or manual soft tissue work to gluteals and lumbar myofascia; low level/unweighted mobility ex's to promote pain relief; awareness of metastatic CA and daily prednisone use    PT Home Exercise Plan Access Code: [email or web address]  previous code no new ex's given today    Recommended Other Services has a home TENs unit but hasn't used in a while    Consulted and Agree with Plan of Care Patient           Patient will benefit from skilled therapeutic intervention in order to improve the following deficits and impairments:  Decreased range of motion,Increased fascial restricitons,Pain,Decreased mobility,Decreased strength,Hypomobility  Visit Diagnosis: Chronic bilateral low back pain without sciatica - Plan: PT plan of care cert/re-cert  Muscle weakness (generalized) - Plan: PT plan of care cert/re-cert  Muscle spasm of back - Plan: PT plan of care cert/re-cert     Problem List Patient Active Problem List   Diagnosis Date Noted  . Pancolitis ([place]) 03/29/2017  . C. difficile colitis 03/29/2017  . H/O: lung cancer  03/29/2017  . ICH (intracerebral hemorrhage) ([place]) 07/14/2016  . Portal vein thrombosis 07/14/2016  . Short-term memory loss 07/14/2016  . Brain metastases ([place]) 07/14/2016  . Metastatic lung cancer (metastasis from lung to other site) ([place]) 07/13/2016  . Brain metastasis ([place]) 07/13/2016   Jeffery Cobb, PT 12/01/20 12:13 PM Phone: [phone] Fax: [phone] Jeffery Cobb 12/01/2020, 12:13 PM  [facility] [address] [place], [place], [address] Phone: [phone]   Fax:  [phone]  Name: Jeffery Cobb MRN: [id number] Date of Birth: 14-Dec-1955

## 2020-12-10 DIAGNOSIS — R2689 Other abnormalities of gait and mobility: Secondary | ICD-10-CM | POA: Diagnosis present

## 2020-12-10 DIAGNOSIS — G8929 Other chronic pain: Secondary | ICD-10-CM | POA: Diagnosis present

## 2020-12-10 DIAGNOSIS — M6281 Muscle weakness (generalized): Secondary | ICD-10-CM | POA: Diagnosis present

## 2020-12-10 DIAGNOSIS — M545 Low back pain, unspecified: Secondary | ICD-10-CM | POA: Diagnosis present

## 2020-12-10 DIAGNOSIS — M62830 Muscle spasm of back: Secondary | ICD-10-CM | POA: Diagnosis present

## 2020-12-10 DIAGNOSIS — R252 Cramp and spasm: Secondary | ICD-10-CM | POA: Diagnosis present

## 2020-12-10 DIAGNOSIS — M25551 Pain in right hip: Secondary | ICD-10-CM | POA: Diagnosis present

## 2020-12-14 ENCOUNTER — Ambulatory Visit: Payer: Managed Care, Other (non HMO) | Admitting: Physical Therapy

## 2020-12-14 ENCOUNTER — Other Ambulatory Visit: Payer: Self-pay

## 2020-12-14 DIAGNOSIS — M545 Low back pain, unspecified: Secondary | ICD-10-CM

## 2020-12-14 DIAGNOSIS — M25551 Pain in right hip: Secondary | ICD-10-CM

## 2020-12-14 DIAGNOSIS — M6281 Muscle weakness (generalized): Secondary | ICD-10-CM

## 2020-12-14 DIAGNOSIS — M62830 Muscle spasm of back: Secondary | ICD-10-CM

## 2020-12-14 DIAGNOSIS — R2689 Other abnormalities of gait and mobility: Secondary | ICD-10-CM

## 2020-12-14 DIAGNOSIS — R252 Cramp and spasm: Secondary | ICD-10-CM

## 2020-12-14 NOTE — Therapy (Signed)
[facility] [address], [place], [place], [address] Phone: [phone]   Fax:  [phone]  Physical Therapy Treatment  Patient Details  Name: Jeffery Cobb MRN: [id number] Date of Birth: Sep 20, 1955 Referring Provider (PT): Towanda Malkin PA   Encounter Date: 12/14/2020   PT End of Session - 12/14/20 0856    Visit Number 2    Date for PT Re-Evaluation 02/23/21    Authorization Type [employer]    PT Start Time [phone]   pt late   PT Stop Time 0945    PT Time Calculation (min) 49 min    Activity Tolerance Patient tolerated treatment well    Behavior During Therapy [facility] for tasks assessed/performed           Past Medical History:  Diagnosis Date  . Cancer ([place])    lung  . Depression     Past Surgical History:  Procedure Laterality Date  . JOINT REPLACEMENT Left 6.20/2018   hip    There were no vitals filed for this visit.   Subjective Assessment - 12/14/20 0857    Subjective I have pain, I am having a hard time balancing doing too much and not enough    Pertinent History on prednisone ("[facility] said recent bone density was fine" ;  stage 4 metastatic lung cancer, adrenal gland damaged;  Lt THA (posterior approach), QUAD damage related to turmor;  ORIF Rt hip;  GOES BY [place]    Diagnostic tests L2-3 herniated discs; mild OA right hip    Currently in Pain? Yes    Pain Score 4     Pain Location Back    Pain Orientation Right    Pain Descriptors / Indicators Sore                             [facility] Adult PT Treatment/Exercise - 12/14/20 0001      Moist Heat Therapy   Number Minutes Moist Heat 15 Minutes    Moist Heat Location Lumbar Spine   Post manual     Manual Therapy   Soft tissue mobilization In Lt sidelying: Rt lumbar,QL and proximal gluteals. pillows between LE all the way to foot                    PT Short Term Goals - 12/01/20 1206      PT SHORT TERM GOAL #1   Title  independent with intial HEP to maintain mobility and promote pain relief    Time 6    Period Weeks    Status New    Target Date 01/12/21      PT SHORT TERM GOAL #2   Title report < or = to 4/10 lumbar pain with exercise    Time 6    Period Weeks    Status New             PT Long Term Goals - 12/01/20 1207      PT LONG TERM GOAL #1   Title independent with HEP and how to progress including aquatic program    Time 12    Period Weeks    Status New    Target Date 02/23/21      PT LONG TERM GOAL #2   Title The patient will report overall improvement in quality of life, function and pain control by 25%    Time 12    Period Weeks  Status New      PT LONG TERM GOAL #3   Title FOTO score improved from 47% to 62%    Time 12    Period Weeks    Status New      PT LONG TERM GOAL #4   Title understand ways to manage pain with home TENS unit, using device to massage muscles and meditation    Time 12    Period Weeks    Status New                 Plan - 12/14/20 2841    Clinical Impression Statement Pt arrives with complaints of Rt low back pain. He will be swimming later on. Pt responded very well to soft tissue work to primarily Rt lumbar, QL and proximal gluteals. Tissues thick with ropey feels especially along the gluteals. Pt reports pain was much less following treatment.    Personal Factors and Comorbidities Comorbidity 3+    Comorbidities metastatic lung cancer, Lt hip replacement, ORIF on the Rt; on prednisone (daily)    Examination-Activity Limitations Dressing;Stand;Lift;Reach Overhead;Locomotion Level    Examination-Participation Restrictions Community Activity;Occupation    Stability/Clinical Decision Making Evolving/Moderate complexity    Rehab Potential Good    PT Frequency 2x / week    PT Duration 12 weeks    PT Treatment/Interventions Electrical Stimulation;Cryotherapy;Iontophoresis 4mg /ml Dexamethasone;Moist Heat;Therapeutic activities;Therapeutic  exercise;Neuromuscular re-education;Manual techniques;Patient/family education;Dry needling;Spinal Manipulations;Taping;Aquatic Therapy    PT Next Visit Plan aquatic therapy as available (waiting list);  Addaday or manual soft tissue work to gluteals and lumbar myofascia; low level/unweighted mobility ex's to promote pain relief; awareness of metastatic CA and daily prednisone use    PT Home Exercise Plan Access Code: [email or web address]  previous code no new ex's given today    Consulted and Agree with Plan of Care Patient           Patient will benefit from skilled therapeutic intervention in order to improve the following deficits and impairments:  Decreased range of motion,Increased fascial restricitons,Pain,Decreased mobility,Decreased strength,Hypomobility  Visit Diagnosis: Chronic bilateral low back pain without sciatica  Muscle weakness (generalized)  Muscle spasm of back  Other abnormalities of gait and mobility  Pain in right hip  Cramp and spasm     Problem List Patient Active Problem List   Diagnosis Date Noted  . Pancolitis ([place]) 03/29/2017  . C. difficile colitis 03/29/2017  . H/O: lung cancer 03/29/2017  . ICH (intracerebral hemorrhage) ([place]) 07/14/2016  . Portal vein thrombosis 07/14/2016  . Short-term memory loss 07/14/2016  . Brain metastases ([place]) 07/14/2016  . Metastatic lung cancer (metastasis from lung to other site) ([place]) 07/13/2016  . Brain metastasis ([place]) 07/13/2016    Samba Cumba, PTA 12/14/2020, 9:32 AM  [facility] [address], [place], [place], [address] Phone: [phone]   Fax:  [phone]  Name: Amaurie Wandel MRN: [id number] Date of Birth: [birth year]

## 2020-12-16 ENCOUNTER — Encounter: Payer: Self-pay | Admitting: Physical Therapy

## 2020-12-16 ENCOUNTER — Other Ambulatory Visit: Payer: Self-pay

## 2020-12-16 ENCOUNTER — Ambulatory Visit: Payer: Managed Care, Other (non HMO) | Admitting: Physical Therapy

## 2020-12-16 DIAGNOSIS — M25551 Pain in right hip: Secondary | ICD-10-CM

## 2020-12-16 DIAGNOSIS — M62830 Muscle spasm of back: Secondary | ICD-10-CM

## 2020-12-16 DIAGNOSIS — G8929 Other chronic pain: Secondary | ICD-10-CM

## 2020-12-16 DIAGNOSIS — M545 Low back pain, unspecified: Secondary | ICD-10-CM | POA: Diagnosis not present

## 2020-12-16 DIAGNOSIS — R252 Cramp and spasm: Secondary | ICD-10-CM

## 2020-12-16 DIAGNOSIS — M6281 Muscle weakness (generalized): Secondary | ICD-10-CM

## 2020-12-16 DIAGNOSIS — R2689 Other abnormalities of gait and mobility: Secondary | ICD-10-CM

## 2020-12-16 NOTE — Therapy (Signed)
[facility] [address] [place], [place], [address] Phone: [phone]   Fax:  [phone]  Physical Therapy Treatment  Patient Details  Name: Jeffery Cobb MRN: [id number] Date of Birth: 1956-05-11 Referring Provider (PT): Towanda Malkin PA   Encounter Date: 12/16/2020   PT End of Session - 12/16/20 0848    Visit Number 3    Date for PT Re-Evaluation 02/23/21    Authorization Type [employer]    PT Start Time 0846    PT Stop Time 0923    PT Time Calculation (min) 37 min    Activity Tolerance --    Behavior During Therapy --           Past Medical History:  Diagnosis Date  . Cancer ([place])    lung  . Depression     Past Surgical History:  Procedure Laterality Date  . JOINT REPLACEMENT Left 6.20/2018   hip    There were no vitals filed for this visit.   Subjective Assessment - 12/16/20 0919    Subjective I was actually sore in my hips after the massage, a good sore though....I am swimming after my session today.    Pertinent History on prednisone ("[facility] said recent bone density was fine" ;  stage 4 metastatic lung cancer, adrenal gland damaged;  Lt THA (posterior approach), QUAD damage related to turmor;  ORIF Rt hip;  GOES BY Jeffery Cobb    Limitations Walking;House hold activities    Diagnostic tests L2-3 herniated discs; mild OA right hip    Patient Stated Goals live without pain; do everyday activities;  I'm just trying to survive    Currently in Pain? Yes    Pain Score 3     Pain Location Back    Pain Orientation Right;Lower    Pain Descriptors / Indicators Dull;Sore    Aggravating Factors  lifting    Pain Relieving Factors swimming, the massage is also helpful    Multiple Pain Sites No                             [facility] Adult PT Treatment/Exercise - 12/16/20 0001      Manual Therapy   Soft tissue mobilization In Lt sidelying: Rt lumbar,QL and proximal gluteals. pillows between LE all  the way to foot                  PT Education - 12/16/20 0924    Education Details Pool exercises: hip abduction with gentle push/pull, hip circumduction with small ROM and a strong single leg stance.    Person(s) Educated Patient    Methods Explanation;Demonstration;Tactile cues;Handout    Comprehension Returned demonstration;Verbalized understanding            PT Short Term Goals - 12/01/20 1206      PT SHORT TERM GOAL #1   Title independent with intial HEP to maintain mobility and promote pain relief    Time 6    Period Weeks    Status New    Target Date 01/12/21      PT SHORT TERM GOAL #2   Title report < or = to 4/10 lumbar pain with exercise    Time 6    Period Weeks    Status New             PT Long Term Goals - 12/01/20 1207      PT LONG TERM GOAL #  1   Title independent with HEP and how to progress including aquatic program    Time 12    Period Weeks    Status New    Target Date 02/23/21      PT LONG TERM GOAL #2   Title The patient will report overall improvement in quality of life, function and pain control by 25%    Time 12    Period Weeks    Status New      PT LONG TERM GOAL #3   Title FOTO score improved from 47% to 62%    Time 12    Period Weeks    Status New      PT LONG TERM GOAL #4   Title understand ways to manage pain with home TENS unit, using device to massage muscles and meditation    Time 12    Period Weeks    Status New                 Plan - 12/16/20 7824    Clinical Impression Statement Pt arrives with mild Rt low back pain. He reports having some glutal soreness after the soft tissue work but it felt 'appropriate." He did swim after the session so that could have contributed to the hip soreness. PTA educated pt in a few additional exercises he could do in the pool today to specifically work on his glutal/hip strength and ROM. Pt correctly demonstrated standing in the clinic.    Personal Factors and  Comorbidities Comorbidity 3+    Comorbidities metastatic lung cancer, Lt hip replacement, ORIF on the Rt; on prednisone (daily)    Examination-Activity Limitations Dressing;Stand;Lift;Reach Overhead;Locomotion Level    Examination-Participation Restrictions Community Activity;Occupation    Stability/Clinical Decision Making Evolving/Moderate complexity    Rehab Potential Good    PT Frequency 2x / week    PT Duration 12 weeks    PT Treatment/Interventions Electrical Stimulation;Cryotherapy;Iontophoresis 4mg /ml Dexamethasone;Moist Heat;Therapeutic activities;Therapeutic exercise;Neuromuscular re-education;Manual techniques;Patient/family education;Dry needling;Spinal Manipulations;Taping;Aquatic Therapy    PT Next Visit Plan aquatic therapy as available (waiting list);  Addaday or manual soft tissue work to gluteals and lumbar myofascia; low level/unweighted mobility ex's to promote pain relief; awareness of metastatic CA and daily prednisone use    PT Home Exercise Plan Access Code: [email or web address]  previous code no new ex's given today    Consulted and Agree with Plan of Care Patient           Patient will benefit from skilled therapeutic intervention in order to improve the following deficits and impairments:  Decreased range of motion,Increased fascial restricitons,Pain,Decreased mobility,Decreased strength,Hypomobility  Visit Diagnosis: Chronic bilateral low back pain without sciatica  Muscle weakness (generalized)  Muscle spasm of back  Other abnormalities of gait and mobility  Pain in right hip  Cramp and spasm     Problem List Patient Active Problem List   Diagnosis Date Noted  . Pancolitis ([place]) 03/29/2017  . C. difficile colitis 03/29/2017  . H/O: lung cancer 03/29/2017  . ICH (intracerebral hemorrhage) ([place]) 07/14/2016  . Portal vein thrombosis 07/14/2016  . Short-term memory loss 07/14/2016  . Brain metastases ([place]) 07/14/2016  . Metastatic lung cancer (metastasis from  lung to other site) ([place]) 07/13/2016  . Brain metastasis ([place]) 07/13/2016    Jeffery Cobb, PTA 12/16/2020, 9:28 AM  [facility] [address], [place], [place], [address] Phone: [phone]   Fax:  [phone]  Name: Jeffery Cobb MRN: [id number] Date of Birth: January 22, 1956

## 2020-12-21 ENCOUNTER — Encounter: Payer: Self-pay | Admitting: Physical Therapy

## 2020-12-21 ENCOUNTER — Ambulatory Visit: Payer: Medicare Other | Attending: Physician Assistant | Admitting: Physical Therapy

## 2020-12-21 ENCOUNTER — Other Ambulatory Visit: Payer: Self-pay

## 2020-12-21 DIAGNOSIS — M25551 Pain in right hip: Secondary | ICD-10-CM | POA: Insufficient documentation

## 2020-12-21 DIAGNOSIS — R2689 Other abnormalities of gait and mobility: Secondary | ICD-10-CM | POA: Insufficient documentation

## 2020-12-21 DIAGNOSIS — M62830 Muscle spasm of back: Secondary | ICD-10-CM | POA: Insufficient documentation

## 2020-12-21 DIAGNOSIS — M6281 Muscle weakness (generalized): Secondary | ICD-10-CM | POA: Insufficient documentation

## 2020-12-21 DIAGNOSIS — R252 Cramp and spasm: Secondary | ICD-10-CM | POA: Diagnosis present

## 2020-12-21 DIAGNOSIS — M545 Low back pain, unspecified: Secondary | ICD-10-CM | POA: Insufficient documentation

## 2020-12-21 DIAGNOSIS — G8929 Other chronic pain: Secondary | ICD-10-CM | POA: Diagnosis present

## 2020-12-21 NOTE — Therapy (Signed)
[facility] [address], [place], [place], [address] Phone: [phone]   Fax:  [phone]  Physical Therapy Treatment  Patient Details  Name: Jeffery Cobb MRN: [id number] Date of Birth: June 08, 1956 Referring Provider (PT): Towanda Malkin PA   Encounter Date: 12/21/2020   PT End of Session - 12/21/20 0903    Visit Number 4    Date for PT Re-Evaluation 02/23/21    Authorization Type [employer]    PT Start Time [phone]   pt late   PT Stop Time 0929    PT Time Calculation (min) 36 min    Activity Tolerance Patient tolerated treatment well    Behavior During Therapy [facility] for tasks assessed/performed           Past Medical History:  Diagnosis Date  . Cancer ([place])    lung  . Depression     Past Surgical History:  Procedure Laterality Date  . JOINT REPLACEMENT Left 6.20/2018   hip    There were no vitals filed for this visit.                      [facility] Adult PT Treatment/Exercise - 12/21/20 0001      Knee/Hip Exercises: Standing   Other Standing Knee Exercises standing Woodpecker ex from [employer] training: 10x Bil    Other Standing Knee Exercises Mini squats for pool addition 10x      Manual Therapy   Soft tissue mobilization In Lt sidelying: Rt lumbar,QL, glute medius and proximal gluteals. pillows between LE all the way to foot                  PT Education - 12/21/20 0927    Education Details Added mini squats ( sit in the chair ) ex to his pool routine, and educated pt in [place] exercise he will look into.    Person(s) Educated Patient    Methods Explanation;Demonstration;Verbal cues;Other (comment)   You Tube Video watched   Comprehension Verbalized understanding;Returned demonstration            PT Short Term Goals - 12/01/20 1206      PT SHORT TERM GOAL #1   Title independent with intial HEP to maintain mobility and promote pain relief     Time 6    Period Weeks    Status New    Target Date 01/12/21      PT SHORT TERM GOAL #2   Title report < or = to 4/10 lumbar pain with exercise    Time 6    Period Weeks    Status New             PT Long Term Goals - 12/01/20 1207      PT LONG TERM GOAL #1   Title independent with HEP and how to progress including aquatic program    Time 12    Period Weeks    Status New    Target Date 02/23/21      PT LONG TERM GOAL #2   Title The patient will report overall improvement in quality of life, function and pain control by 25%    Time 12    Period Weeks    Status New      PT LONG TERM GOAL #3   Title FOTO score improved from 47% to 62%    Time 12    Period Weeks    Status New  PT LONG TERM GOAL #4   Title understand ways to manage pain with home TENS unit, using device to massage muscles and meditation    Time 12    Period Weeks    Status New                 Plan - 12/21/20 0932    Clinical Impression Statement Pt arrives with essentially no pain, but his pain is intermittent in nature and gives the occasional "electric shock" down into his Lt leg. Pt successfully integrate some exercises into his current pool program and we added mini squats ( sit in the chair) today. Pt demonstrates poor Lt hip stabilization. PTA introduced pt to [employer] via [employer], specifically the [facility] exercise that promotes deep hip stabilization. Pt was successful in doing this on his Rt LE but was super wobbly on the LT LE. Pt reports he will watch the video and practice in small quantities. LT glute medius very knottty and tender today. Pt going to pool after session today.    Personal Factors and Comorbidities Comorbidity 3+    Comorbidities metastatic lung cancer, Lt hip replacement, ORIF on the Rt; on prednisone (daily)    Examination-Activity Limitations Dressing;Stand;Lift;Reach Overhead;Locomotion Level    Examination-Participation Restrictions Community  Activity;Occupation    Stability/Clinical Decision Making Evolving/Moderate complexity    Rehab Potential Good    PT Frequency 2x / week    PT Duration 12 weeks    PT Treatment/Interventions Electrical Stimulation;Cryotherapy;Iontophoresis 4mg /ml Dexamethasone;Moist Heat;Therapeutic activities;Therapeutic exercise;Neuromuscular re-education;Manual techniques;Patient/family education;Dry needling;Spinal Manipulations;Taping;Aquatic Therapy    PT Next Visit Plan See how new pool exercises are going, follow up with pt watching the You Tube Video or not...Marland Kitchenor explore other Lt hip stabilization exercises pt can be successful doing. Manual to Lt hip.    PT Home Exercise Plan Access Code: [email or web address]  previous code no new ex's given today    Consulted and Agree with Plan of Care Patient           Patient will benefit from skilled therapeutic intervention in order to improve the following deficits and impairments:  Decreased range of motion,Increased fascial restricitons,Pain,Decreased mobility,Decreased strength,Hypomobility  Visit Diagnosis: Chronic bilateral low back pain without sciatica  Muscle weakness (generalized)  Muscle spasm of back  Other abnormalities of gait and mobility  Cramp and spasm     Problem List Patient Active Problem List   Diagnosis Date Noted  . Pancolitis ([place]) 03/29/2017  . C. difficile colitis 03/29/2017  . H/O: lung cancer 03/29/2017  . ICH (intracerebral hemorrhage) ([place]) 07/14/2016  . Portal vein thrombosis 07/14/2016  . Short-term memory loss 07/14/2016  . Brain metastases ([place]) 07/14/2016  . Metastatic lung cancer (metastasis from lung to other site) ([place]) 07/13/2016  . Brain metastasis ([place]) 07/13/2016    Mazell Aylesworth, PTA 12/21/2020, 11:27 AM  [facility] [address], [place], [place], [address] Phone: [phone]   Fax:  [phone]  Name: Jeffery Cobb MRN: [id number] Date  of Birth: 1956/03/31

## 2020-12-23 ENCOUNTER — Other Ambulatory Visit: Payer: Self-pay

## 2020-12-23 ENCOUNTER — Ambulatory Visit: Payer: Medicare Other | Admitting: Physical Therapy

## 2020-12-23 DIAGNOSIS — M62830 Muscle spasm of back: Secondary | ICD-10-CM

## 2020-12-23 DIAGNOSIS — G8929 Other chronic pain: Secondary | ICD-10-CM

## 2020-12-23 DIAGNOSIS — M545 Low back pain, unspecified: Secondary | ICD-10-CM

## 2020-12-23 DIAGNOSIS — M6281 Muscle weakness (generalized): Secondary | ICD-10-CM

## 2020-12-23 NOTE — Therapy (Signed)
[facility] [address], [place], [place], [address] Phone: [phone]   Fax:  [phone]  Physical Therapy Treatment  Patient Details  Name: Jeffery Cobb MRN: [id number] Date of Birth: 12/01/1955 Referring Provider (PT): Towanda Malkin PA   Encounter Date: 12/23/2020   PT End of Session - 12/23/20 0935    Visit Number 5    Date for PT Re-Evaluation 02/23/21    Authorization Type [employer]    PT Start Time 0845    PT Stop Time 0920    PT Time Calculation (min) 35 min    Activity Tolerance Patient tolerated treatment well           Past Medical History:  Diagnosis Date  . Cancer ([place])    lung  . Depression     Past Surgical History:  Procedure Laterality Date  . JOINT REPLACEMENT Left 6.20/2018   hip    There were no vitals filed for this visit.   Subjective Assessment - 12/23/20 0851    Subjective Right low back pain.  Improved with manual therapy in the clinic and with swimming.    Pertinent History on prednisone ("[facility] said recent bone density was fine" ;  stage 4 metastatic lung cancer, adrenal gland damaged;  Lt THA (posterior approach), QUAD damage related to turmor;  ORIF Rt hip;  GOES BY [place]    Diagnostic tests L2-3 herniated discs; mild OA right hip    Patient Stated Goals live without pain; do everyday activities;  I'm just trying to survive    Currently in Pain? Yes    Pain Score 4     Pain Location Back    Pain Orientation Lower    Pain Type Chronic pain                             [facility] Adult PT Treatment/Exercise - 12/23/20 0001      Self-Care   Self-Care Other Self-Care Comments    Other Self-Care Comments  discussed current pool and home ex's pt expresses satisfaction in current program      Manual Therapy   Manual therapy comments sidelying neutral gapping 5x 30 sec    Soft tissue mobilization In Lt sidelying and prone: Rt lumbar,QL, glute medius and proximal  gluteals. pillows between LE all the way to foot                    PT Short Term Goals - 12/01/20 1206      PT SHORT TERM GOAL #1   Title independent with intial HEP to maintain mobility and promote pain relief    Time 6    Period Weeks    Status New    Target Date 01/12/21      PT SHORT TERM GOAL #2   Title report < or = to 4/10 lumbar pain with exercise    Time 6    Period Weeks    Status New             PT Long Term Goals - 12/01/20 1207      PT LONG TERM GOAL #1   Title independent with HEP and how to progress including aquatic program    Time 12    Period Weeks    Status New    Target Date 02/23/21      PT LONG TERM GOAL #2   Title The patient will report  overall improvement in quality of life, function and pain control by 25%    Time 12    Period Weeks    Status New      PT LONG TERM GOAL #3   Title FOTO score improved from 47% to 62%    Time 12    Period Weeks    Status New      PT LONG TERM GOAL #4   Title understand ways to manage pain with home TENS unit, using device to massage muscles and meditation    Time 12    Period Weeks    Status New                 Plan - 12/23/20 0935    Clinical Impression Statement The patient reports low intensity right LBP overall.  He continues to be faithful with his pool routine and is headed there after today's visit.  Improved thoracolumbar and gluteal soft tissue noted compared to initial visit and following manual therapy today.   The patient's goal remains pain relief.  Skilled therapy necessary for appropriate treatment and intensity given his daily steroid use.    Comorbidities metastatic lung cancer, Lt hip replacement, ORIF on the Rt; on prednisone (daily)    Examination-Activity Limitations Dressing;Stand;Lift;Reach Overhead;Locomotion Level    Rehab Potential Good    PT Frequency 2x / week    PT Duration 12 weeks    PT Treatment/Interventions Electrical  Stimulation;Cryotherapy;Iontophoresis 4mg /ml Dexamethasone;Moist Heat;Therapeutic activities;Therapeutic exercise;Neuromuscular re-education;Manual techniques;Patient/family education;Dry needling;Spinal Manipulations;Taping;Aquatic Therapy    PT Next Visit Plan See how new pool exercises are going, follow up with pt watching the You Tube Video or not...Marland Kitchenor explore other Lt hip stabilization exercises pt can be successful doing. Manual to Lt hip.    PT Home Exercise Plan Access Code: [email or web address]  previous code no new ex's given today           Patient will benefit from skilled therapeutic intervention in order to improve the following deficits and impairments:  Decreased range of motion,Increased fascial restricitons,Pain,Decreased mobility,Decreased strength,Hypomobility  Visit Diagnosis: Chronic bilateral low back pain without sciatica  Muscle weakness (generalized)  Muscle spasm of back     Problem List Patient Active Problem List   Diagnosis Date Noted  . Pancolitis ([place]) 03/29/2017  . C. difficile colitis 03/29/2017  . H/O: lung cancer 03/29/2017  . ICH (intracerebral hemorrhage) ([place]) 07/14/2016  . Portal vein thrombosis 07/14/2016  . Short-term memory loss 07/14/2016  . Brain metastases ([place]) 07/14/2016  . Metastatic lung cancer (metastasis from lung to other site) ([place]) 07/13/2016  . Brain metastasis ([place]) 07/13/2016   Ruben Im, PT 12/23/20 9:43 AM Phone: [phone] Fax: [phone] Alvera Singh 12/23/2020, 9:42 AM  [facility] [address] [place], [place], [address] Phone: [phone]   Fax:  [phone]  Name: Jeffery Cobb MRN: [id number] Date of Birth: [birth year]

## 2020-12-28 ENCOUNTER — Other Ambulatory Visit: Payer: Self-pay

## 2020-12-28 ENCOUNTER — Ambulatory Visit: Payer: Medicare Other | Admitting: Physical Therapy

## 2020-12-28 ENCOUNTER — Encounter: Payer: Self-pay | Admitting: Physical Therapy

## 2020-12-28 DIAGNOSIS — M545 Low back pain, unspecified: Secondary | ICD-10-CM | POA: Diagnosis not present

## 2020-12-28 DIAGNOSIS — R252 Cramp and spasm: Secondary | ICD-10-CM

## 2020-12-28 DIAGNOSIS — G8929 Other chronic pain: Secondary | ICD-10-CM

## 2020-12-28 DIAGNOSIS — R2689 Other abnormalities of gait and mobility: Secondary | ICD-10-CM

## 2020-12-28 DIAGNOSIS — M62830 Muscle spasm of back: Secondary | ICD-10-CM

## 2020-12-28 DIAGNOSIS — M25551 Pain in right hip: Secondary | ICD-10-CM

## 2020-12-28 DIAGNOSIS — M6281 Muscle weakness (generalized): Secondary | ICD-10-CM

## 2020-12-28 NOTE — Therapy (Signed)
[facility] [address] [place], [place], [address] Phone: [phone]   Fax:  [phone]  Physical Therapy Treatment  Patient Details  Name: Jeffery Cobb MRN: [id number] Date of Birth: 1956/05/20 Referring Provider (PT): Towanda Malkin PA   Encounter Date: 12/28/2020   PT End of Session - 12/28/20 0849    Visit Number 6    Date for PT Re-Evaluation 02/23/21    Authorization Type Cigna    PT Start Time 0846    PT Stop Time 0917    PT Time Calculation (min) 31 min    Activity Tolerance Patient tolerated treatment well    Behavior During Therapy [facility] for tasks assessed/performed           Past Medical History:  Diagnosis Date  . Cancer ([place])    lung  . Depression     Past Surgical History:  Procedure Laterality Date  . JOINT REPLACEMENT Left 6.20/2018   hip    There were no vitals filed for this visit.   Subjective Assessment - 12/28/20 0850    Subjective Currently pain 3-4/10    Pertinent History on prednisone ("[facility] said recent bone density was fine" ;  stage 4 metastatic lung cancer, adrenal gland damaged;  Lt THA (posterior approach), QUAD damage related to turmor;  ORIF Rt hip;  [employer]    Currently in Pain? Yes    Pain Score 4     Pain Location Back    Pain Orientation Lower;Right    Pain Descriptors / Indicators Dull;Aching    Aggravating Factors  lifting, reaching    Pain Relieving Factors swimming    Multiple Pain Sites No                             [facility] Adult PT Treatment/Exercise - 12/28/20 0001      Manual Therapy   Soft tissue mobilization In Lt sidelying and prone: Rt lumbar,QL, glute medius and proximal gluteals. pillows between LE all the way to foot                  PT Education - 12/28/20 0920    Education Details HEP progression in the pool.    Person(s) Educated Patient    Methods Explanation    Comprehension Verbalized understanding             PT Short Term Goals - 12/01/20 1206      PT SHORT TERM GOAL #1   Title independent with intial HEP to maintain mobility and promote pain relief    Time 6    Period Weeks    Status New    Target Date 01/12/21      PT SHORT TERM GOAL #2   Title report < or = to 4/10 lumbar pain with exercise    Time 6    Period Weeks    Status New             PT Long Term Goals - 12/01/20 1207      PT LONG TERM GOAL #1   Title independent with HEP and how to progress including aquatic program    Time 12    Period Weeks    Status New    Target Date 02/23/21      PT LONG TERM GOAL #2   Title The patient will report overall improvement in quality of life, function and pain control by  25%    Time 12    Period Weeks    Status New      PT LONG TERM GOAL #3   Title FOTO score improved from 47% to 62%    Time 12    Period Weeks    Status New      PT LONG TERM GOAL #4   Title understand ways to manage pain with home TENS unit, using device to massage muscles and meditation    Time 12    Period Weeks    Status New                 Plan - 12/28/20 0854    Clinical Impression Statement Pt has been successful im implementing exercises in the pool for increasing his hip strength. Today he was advised to increase his reps to 15-20x, add mini squats and reverse walking. All exercises encouraged to perform in at least mid waist depth. Pt agreed and verbally understood. PTA noted less trigger points in his Rt gluteal group today.    Personal Factors and Comorbidities Comorbidity 3+    Comorbidities metastatic lung cancer, Lt hip replacement, ORIF on the Rt; on prednisone (daily)    Examination-Activity Limitations Dressing;Stand;Lift;Reach Overhead;Locomotion Level    Examination-Participation Restrictions Community Activity;Occupation    Stability/Clinical Decision Making Evolving/Moderate complexity    Rehab Potential Good    PT Frequency 2x / week    PT Duration 12 weeks     PT Treatment/Interventions Electrical Stimulation;Cryotherapy;Iontophoresis 4mg /ml Dexamethasone;Moist Heat;Therapeutic activities;Therapeutic exercise;Neuromuscular re-education;Manual techniques;Patient/family education;Dry needling;Spinal Manipulations;Taping;Aquatic Therapy    PT Next Visit Plan See how new exercises in the pool went.    PT Home Exercise Plan Access Code: [email or web address]  previous code no new ex's given today    Consulted and Agree with Plan of Care Patient           Patient will benefit from skilled therapeutic intervention in order to improve the following deficits and impairments:  Decreased range of motion,Increased fascial restricitons,Pain,Decreased mobility,Decreased strength,Hypomobility  Visit Diagnosis: Chronic bilateral low back pain without sciatica  Muscle weakness (generalized)  Muscle spasm of back  Other abnormalities of gait and mobility  Cramp and spasm  Pain in right hip     Problem List Patient Active Problem List   Diagnosis Date Noted  . Pancolitis ([place]) 03/29/2017  . C. difficile colitis 03/29/2017  . H/O: lung cancer 03/29/2017  . ICH (intracerebral hemorrhage) ([place]) 07/14/2016  . Portal vein thrombosis 07/14/2016  . Short-term memory loss 07/14/2016  . Brain metastases ([place]) 07/14/2016  . Metastatic lung cancer (metastasis from lung to other site) ([place]) 07/13/2016  . Brain metastasis ([place]) 07/13/2016    Bethenny Losee, PTA 12/28/2020, 9:21 AM  [facility] [address], [place], [place], [address] Phone: [phone]   Fax:  [phone]  Name: Jeffery Cobb MRN: [id number] Date of Birth: March 13, 1956

## 2020-12-30 ENCOUNTER — Other Ambulatory Visit: Payer: Self-pay

## 2020-12-30 ENCOUNTER — Ambulatory Visit: Payer: Medicare Other | Admitting: Physical Therapy

## 2020-12-30 DIAGNOSIS — M62830 Muscle spasm of back: Secondary | ICD-10-CM

## 2020-12-30 DIAGNOSIS — M6281 Muscle weakness (generalized): Secondary | ICD-10-CM

## 2020-12-30 DIAGNOSIS — M545 Low back pain, unspecified: Secondary | ICD-10-CM

## 2020-12-30 NOTE — Therapy (Signed)
[facility] [address], [place], [place], [address] Phone: [phone]   Fax:  [phone]  Physical Therapy Treatment  Patient Details  Name: Jeffery Cobb MRN: [id number] Date of Birth: 1955-11-29 Referring Provider (PT): Towanda Malkin PA   Encounter Date: 12/30/2020   PT End of Session - 12/30/20 1236    Visit Number 7    Date for PT Re-Evaluation 02/23/21    Authorization Type [employer]    PT Start Time 0845    PT Stop Time 0920    PT Time Calculation (min) 35 min    Activity Tolerance Patient tolerated treatment well           Past Medical History:  Diagnosis Date  . Cancer ([place])    lung  . Depression     Past Surgical History:  Procedure Laterality Date  . JOINT REPLACEMENT Left 6.20/2018   hip    There were no vitals filed for this visit.   Subjective Assessment - 12/30/20 0853    Subjective My back is about the same.  I'm avoiding heavy lifting or stretching too far out.  Being hyperaware of back.  Avoiding bending over.  Happens every 6 months.    Pertinent History on prednisone ("[facility] said recent bone density was fine" ;  stage 4 metastatic lung cancer, adrenal gland damaged;  Lt THA (posterior approach), QUAD damage related to turmor;  ORIF Rt hip;  GOES BY Jeffery Cobb    How long can you stand comfortably? uses walker sometimes (walks dog 2x/day)    Patient Stated Goals live without pain; do everyday activities;  I'm just trying to survive    Currently in Pain? Yes    Pain Score 4     Pain Location Back                             [facility] Adult PT Treatment/Exercise - 12/30/20 0001      Self-Care   Other Self-Care Comments  discussed current pool and home ex's pt expresses satisfaction in current program; discussion of plan of care and decreasing treatment frequency to 1x/week      Manual Therapy   Manual therapy comments sidelying neutral gapping 5x 30 sec    Soft tissue  mobilization In Lt sidelying and prone: Rt lumbar,QL, glute medius and proximal gluteals. pillows between LE all the way to foot                    PT Short Term Goals - 12/01/20 1206      PT SHORT TERM GOAL #1   Title independent with intial HEP to maintain mobility and promote pain relief    Time 6    Period Weeks    Status New    Target Date 01/12/21      PT SHORT TERM GOAL #2   Title report < or = to 4/10 lumbar pain with exercise    Time 6    Period Weeks    Status New             PT Long Term Goals - 12/01/20 1207      PT LONG TERM GOAL #1   Title independent with HEP and how to progress including aquatic program    Time 12    Period Weeks    Status New    Target Date 02/23/21      PT LONG TERM  GOAL #2   Title The patient will report overall improvement in quality of life, function and pain control by 25%    Time 12    Period Weeks    Status New      PT LONG TERM GOAL #3   Title FOTO score improved from 47% to 62%    Time 12    Period Weeks    Status New      PT LONG TERM GOAL #4   Title understand ways to manage pain with home TENS unit, using device to massage muscles and meditation    Time 12    Period Weeks    Status New                 Plan - 12/30/20 1236    Clinical Impression Statement The patient reports no acute pain but continues to limit lifting and reaching which he feels could trigger an episode.  He is very satisfied with his lap swimming on a regular basis and manual therapy which he feels is helping overall. Discussed decreasing frequency to 1x/week.  Will encourage initiation of some land based ex's for his back to encourage resiliency to future occurrences.  Much improved lumbar fascial mobility and no spasm noted today.    Comorbidities metastatic lung cancer, Lt hip replacement, ORIF on the Rt; on prednisone (daily)    Rehab Potential Good    PT Frequency 2x / week    PT Duration 12 weeks    PT  Treatment/Interventions Electrical Stimulation;Cryotherapy;Iontophoresis 4mg /ml Dexamethasone;Moist Heat;Therapeutic activities;Therapeutic exercise;Neuromuscular re-education;Manual techniques;Patient/family education;Dry needling;Spinal Manipulations;Taping;Aquatic Therapy    PT Next Visit Plan try some land based ex for best long term outcomes and resiliency; manual therapy;  aquatic ex when available;  reduce frequency to 1x/week    PT Home Exercise Plan Access Code: [email or web address]  previous code no new ex's given today           Patient will benefit from skilled therapeutic intervention in order to improve the following deficits and impairments:  Decreased range of motion,Increased fascial restricitons,Pain,Decreased mobility,Decreased strength,Hypomobility  Visit Diagnosis: Chronic bilateral low back pain without sciatica  Muscle weakness (generalized)  Muscle spasm of back     Problem List Patient Active Problem List   Diagnosis Date Noted  . Pancolitis ([place]) 03/29/2017  . C. difficile colitis 03/29/2017  . H/O: lung cancer 03/29/2017  . ICH (intracerebral hemorrhage) ([place]) 07/14/2016  . Portal vein thrombosis 07/14/2016  . Short-term memory loss 07/14/2016  . Brain metastases ([place]) 07/14/2016  . Metastatic lung cancer (metastasis from lung to other site) ([place]) 07/13/2016  . Brain metastasis ([place]) 07/13/2016   Ruben Im, PT 12/30/20 12:46 PM Phone: [phone] Fax: [phone] Alvera Singh 12/30/2020, 12:46 PM  [facility] [address], [place], [place], [address] Phone: [phone]   Fax:  [phone]  Name: Jeffery Cobb MRN: [id number] Date of Birth: 03-15-1956

## 2021-01-04 ENCOUNTER — Ambulatory Visit: Payer: Medicare Other | Admitting: Physical Therapy

## 2021-01-04 ENCOUNTER — Encounter: Payer: Self-pay | Admitting: Physical Therapy

## 2021-01-04 ENCOUNTER — Other Ambulatory Visit: Payer: Self-pay

## 2021-01-04 DIAGNOSIS — M545 Low back pain, unspecified: Secondary | ICD-10-CM

## 2021-01-04 DIAGNOSIS — M62830 Muscle spasm of back: Secondary | ICD-10-CM

## 2021-01-04 DIAGNOSIS — M6281 Muscle weakness (generalized): Secondary | ICD-10-CM

## 2021-01-04 NOTE — Therapy (Signed)
[facility] [address], [place], [place], [address] Phone: [phone]   Fax:  [phone]  Physical Therapy Treatment  Patient Details  Name: Jeffery Cobb MRN: [id number] Date of Birth: 28-Jun-1956 Referring Provider (PT): Towanda Malkin PA   Encounter Date: 01/04/2021   PT End of Session - 01/04/21 0805    Visit Number 8    Authorization Type [employer]    PT Start Time 0800    PT Stop Time 0836    PT Time Calculation (min) 36 min    Activity Tolerance Patient tolerated treatment well    Behavior During Therapy [facility] for tasks assessed/performed           Past Medical History:  Diagnosis Date  . Cancer ([place])    lung  . Depression     Past Surgical History:  Procedure Laterality Date  . JOINT REPLACEMENT Left 6.20/2018   hip    There were no vitals filed for this visit.   Subjective Assessment - 01/04/21 0807    Subjective I "ran" in the water the other day and it was fantastic.    Pertinent History on prednisone ("[facility] said recent bone density was fine" ;  stage 4 metastatic lung cancer, adrenal gland damaged;  Lt THA (posterior approach), QUAD damage related to turmor;  ORIF Rt hip;  [place]    Currently in Pain? No/denies                             [facility] Adult PT Treatment/Exercise - 01/04/21 0001      Manual Therapy   Soft tissue mobilization In Lt sidelying  Rt lumbar,QL, glute medius, TFL and proximal gluteals. pillows between LE all the way to foot                    PT Short Term Goals - 12/01/20 1206      PT SHORT TERM GOAL #1   Title independent with intial HEP to maintain mobility and promote pain relief    Time 6    Period Weeks    Status New    Target Date 01/12/21      PT SHORT TERM GOAL #2   Title report < or = to 4/10 lumbar pain with exercise    Time 6    Period Weeks    Status New             PT Long Term Goals - 12/01/20 1207       PT LONG TERM GOAL #1   Title independent with HEP and how to progress including aquatic program    Time 12    Period Weeks    Status New    Target Date 02/23/21      PT LONG TERM GOAL #2   Title The patient will report overall improvement in quality of life, function and pain control by 25%    Time 12    Period Weeks    Status New      PT LONG TERM GOAL #3   Title FOTO score improved from 47% to 62%    Time 12    Period Weeks    Status New      PT LONG TERM GOAL #4   Title understand ways to manage pain with home TENS unit, using device to massage muscles and meditation    Time 12    Period  Weeks    Status New                 Plan - 01/04/21 0836    Clinical Impression Statement Pt arrives pain free and feels very positive about the changes in his hip: specifically the ability to facilitate muscle growth. Pt was able to "jog" in the water and felt very good about it both mentally and physically. Muscle and fascia continue to improve with changes that are "sticking."    Personal Factors and Comorbidities Comorbidity 3+    Comorbidities metastatic lung cancer, Lt hip replacement, ORIF on the Rt; on prednisone (daily)    Examination-Activity Limitations Dressing;Stand;Lift;Reach Overhead;Locomotion Level    Examination-Participation Restrictions Community Activity;Occupation    Stability/Clinical Decision Making Evolving/Moderate complexity    Rehab Potential Good    PT Frequency 2x / week    PT Duration 12 weeks    PT Treatment/Interventions Electrical Stimulation;Cryotherapy;Iontophoresis 4mg /ml Dexamethasone;Moist Heat;Therapeutic activities;Therapeutic exercise;Neuromuscular re-education;Manual techniques;Patient/family education;Dry needling;Spinal Manipulations;Taping;Aquatic Therapy    PT Next Visit Plan try some land based ex for best long term outcomes and resiliency; manual therapy;  aquatic ex when available;  reduce frequency to 1x/week    PT Home Exercise Plan  Access Code: [email or web address]  previous code no new ex's given today    Consulted and Agree with Plan of Care Patient           Patient will benefit from skilled therapeutic intervention in order to improve the following deficits and impairments:  Decreased range of motion,Increased fascial restricitons,Pain,Decreased mobility,Decreased strength,Hypomobility  Visit Diagnosis: Chronic bilateral low back pain without sciatica  Muscle weakness (generalized)  Muscle spasm of back     Problem List Patient Active Problem List   Diagnosis Date Noted  . Pancolitis ([place]) 03/29/2017  . C. difficile colitis 03/29/2017  . H/O: lung cancer 03/29/2017  . ICH (intracerebral hemorrhage) ([place]) 07/14/2016  . Portal vein thrombosis 07/14/2016  . Short-term memory loss 07/14/2016  . Brain metastases ([place]) 07/14/2016  . Metastatic lung cancer (metastasis from lung to other site) ([place]) 07/13/2016  . Brain metastasis ([place]) 07/13/2016    Jeffery Cobb, PTA 01/04/2021, 8:40 AM  [facility] [address], [place], [place], [address] Phone: [phone]   Fax:  [phone]  Name: Jeffery Cobb MRN: [id number] Date of Birth: 1955/10/17

## 2021-01-06 ENCOUNTER — Ambulatory Visit: Payer: Medicare Other | Admitting: Physical Therapy

## 2021-01-11 ENCOUNTER — Ambulatory Visit: Payer: Medicare Other | Admitting: Physical Therapy

## 2021-01-11 ENCOUNTER — Encounter: Payer: Self-pay | Admitting: Physical Therapy

## 2021-01-11 ENCOUNTER — Other Ambulatory Visit: Payer: Self-pay

## 2021-01-11 DIAGNOSIS — M545 Low back pain, unspecified: Secondary | ICD-10-CM | POA: Diagnosis not present

## 2021-01-11 DIAGNOSIS — M25551 Pain in right hip: Secondary | ICD-10-CM

## 2021-01-11 DIAGNOSIS — M6281 Muscle weakness (generalized): Secondary | ICD-10-CM

## 2021-01-11 DIAGNOSIS — R252 Cramp and spasm: Secondary | ICD-10-CM

## 2021-01-11 DIAGNOSIS — M62830 Muscle spasm of back: Secondary | ICD-10-CM

## 2021-01-11 DIAGNOSIS — G8929 Other chronic pain: Secondary | ICD-10-CM

## 2021-01-11 DIAGNOSIS — R2689 Other abnormalities of gait and mobility: Secondary | ICD-10-CM

## 2021-01-11 NOTE — Therapy (Signed)
[facility] [address], [place], [place], [address] Phone: [phone]   Fax:  [phone]  Physical Therapy Treatment  Patient Details  Name: Jeffery Cobb MRN: [id number] Date of Birth: [birth year] Referring Provider (PT): Towanda Malkin PA   Encounter Date: 01/11/2021   PT End of Session - 01/11/21 0855    Visit Number 9    Date for PT Re-Evaluation 02/23/21    Authorization Type [employer]    PT Start Time 0854    PT Stop Time 0923    PT Time Calculation (min) 29 min    Activity Tolerance Patient tolerated treatment well    Behavior During Therapy [facility] for tasks assessed/performed           Past Medical History:  Diagnosis Date  . Cancer ([place])    lung  . Depression     Past Surgical History:  Procedure Laterality Date  . JOINT REPLACEMENT Left 6.20/2018   hip    There were no vitals filed for this visit.   Subjective Assessment - 01/11/21 0855    Subjective I see Dr Annette Stable this afternoon. I want to make sure I maintain my progress throughout the year.    Pertinent History on prednisone ("[facility] said recent bone density was fine" ;  stage 4 metastatic lung cancer, adrenal gland damaged;  Lt THA (posterior approach), QUAD damage related to turmor;  ORIF Rt hip;  GOES BY Jeffery Cobb    Diagnostic tests L2-3 herniated discs; mild OA right hip    Currently in Pain? Yes    Pain Score 2     Pain Location Back    Pain Orientation Right;Lower    Pain Descriptors / Indicators Dull;Aching    Aggravating Factors  lifting    Pain Relieving Factors swimming, body work    Multiple Pain Sites No                             [facility] Adult PT Treatment/Exercise - 01/11/21 0001      Manual Therapy   Soft tissue mobilization In Lt sidelying  Rt lumbar,QL, glute medius, TFL and proximal gluteals. pillows between LE all the way to foot                  PT Education - 01/11/21 0922    Education Details  Progressing water exercises: add side stepping with and without a mini squat depending on how much quad work he wants.    Person(s) Educated Patient    Methods Explanation;Demonstration    Comprehension Verbalized understanding;Returned demonstration            PT Short Term Goals - 12/01/20 1206      PT SHORT TERM GOAL #1   Title independent with intial HEP to maintain mobility and promote pain relief    Time 6    Period Weeks    Status New    Target Date 01/12/21      PT SHORT TERM GOAL #2   Title report < or = to 4/10 lumbar pain with exercise    Time 6    Period Weeks    Status New             PT Long Term Goals - 12/01/20 1207      PT LONG TERM GOAL #1   Title independent with HEP and how to progress including aquatic program    Time 12  Period Weeks    Status New    Target Date 02/23/21      PT LONG TERM GOAL #2   Title The patient will report overall improvement in quality of life, function and pain control by 25%    Time 12    Period Weeks    Status New      PT LONG TERM GOAL #3   Title FOTO score improved from 47% to 62%    Time 12    Period Weeks    Status New      PT LONG TERM GOAL #4   Title understand ways to manage pain with home TENS unit, using device to massage muscles and meditation    Time 12    Period Weeks    Status New                 Plan - 01/11/21 6578    Clinical Impression Statement Pt conintues to report significant improvement in pain reduction and daily function since coming to PT. He verbally reports concern for "backsliding" and is interested in discussing a maintainance program since he feels he is a high risk of regression. He is thinking of something like resuming PT 1-2x year for 4 weeks. He plans on discussing this with PT. Pt continues to build better muscle bulk in his RT glutes group.    Personal Factors and Comorbidities Comorbidity 3+    Comorbidities metastatic lung cancer, Lt hip replacement, ORIF on the  Rt; on prednisone (daily)    Examination-Activity Limitations Dressing;Stand;Lift;Reach Overhead;Locomotion Level    Examination-Participation Restrictions Community Activity;Occupation    Stability/Clinical Decision Making Evolving/Moderate complexity    Rehab Potential Good    PT Frequency 2x / week    PT Duration 12 weeks    PT Treatment/Interventions Electrical Stimulation;Cryotherapy;Iontophoresis 4mg /ml Dexamethasone;Moist Heat;Therapeutic activities;Therapeutic exercise;Neuromuscular re-education;Manual techniques;Patient/family education;Dry needling;Spinal Manipulations;Taping;Aquatic Therapy    PT Next Visit Plan 10th visit PN next    PT Home Exercise Plan Access Code: [email or web address]  previous code no new ex's given today    Consulted and Agree with Plan of Care Patient           Patient will benefit from skilled therapeutic intervention in order to improve the following deficits and impairments:  Decreased range of motion,Increased fascial restricitons,Pain,Decreased mobility,Decreased strength,Hypomobility  Visit Diagnosis: Chronic bilateral low back pain without sciatica  Muscle weakness (generalized)  Muscle spasm of back  Other abnormalities of gait and mobility  Cramp and spasm  Pain in right hip     Problem List Patient Active Problem List   Diagnosis Date Noted  . Pancolitis ([place]) 03/29/2017  . C. difficile colitis 03/29/2017  . H/O: lung cancer 03/29/2017  . ICH (intracerebral hemorrhage) ([place]) 07/14/2016  . Portal vein thrombosis 07/14/2016  . Short-term memory loss 07/14/2016  . Brain metastases ([place]) 07/14/2016  . Metastatic lung cancer (metastasis from lung to other site) ([place]) 07/13/2016  . Brain metastasis ([place]) 07/13/2016    Jeffery Cobb, PTA 01/11/2021, 9:29 AM   [facility] [address], [place], [place], [address] Phone: [phone]   Fax:  [phone]  Name: Jeffery Cobb MRN: [id number] Date of Birth: [birth year]

## 2021-01-13 ENCOUNTER — Ambulatory Visit: Payer: Medicare Other | Admitting: Physical Therapy

## 2021-01-17 ENCOUNTER — Ambulatory Visit (HOSPITAL_BASED_OUTPATIENT_CLINIC_OR_DEPARTMENT_OTHER): Payer: Medicare Other | Admitting: Physical Therapy

## 2021-01-18 ENCOUNTER — Encounter: Payer: Managed Care, Other (non HMO) | Admitting: Physical Therapy

## 2021-01-20 ENCOUNTER — Other Ambulatory Visit: Payer: Self-pay

## 2021-01-20 ENCOUNTER — Ambulatory Visit: Payer: Medicare Other | Attending: Physician Assistant | Admitting: Physical Therapy

## 2021-01-20 DIAGNOSIS — M25551 Pain in right hip: Secondary | ICD-10-CM | POA: Insufficient documentation

## 2021-01-20 DIAGNOSIS — M6281 Muscle weakness (generalized): Secondary | ICD-10-CM | POA: Insufficient documentation

## 2021-01-20 DIAGNOSIS — R2689 Other abnormalities of gait and mobility: Secondary | ICD-10-CM | POA: Diagnosis present

## 2021-01-20 DIAGNOSIS — G8929 Other chronic pain: Secondary | ICD-10-CM | POA: Insufficient documentation

## 2021-01-20 DIAGNOSIS — M62830 Muscle spasm of back: Secondary | ICD-10-CM | POA: Diagnosis present

## 2021-01-20 DIAGNOSIS — R252 Cramp and spasm: Secondary | ICD-10-CM | POA: Diagnosis present

## 2021-01-20 DIAGNOSIS — M545 Low back pain, unspecified: Secondary | ICD-10-CM | POA: Diagnosis present

## 2021-01-20 NOTE — Therapy (Signed)
[facility] [address] [place], [place], [address] Phone: [phone]   Fax:  [phone]  Physical Therapy Treatment  Patient Details  Name: Jeffery Cobb MRN: [id number] Date of Birth: 1956/01/09 Referring Provider (PT): Jeffery Malkin PA  Progress Note Reporting Period 12/01/2020 to 01/20/2021  See note below for Objective Data and Assessment of Progress/Goals.      Encounter Date: 01/20/2021   PT End of Session - 01/20/21 1922    Visit Number 10    Date for PT Re-Evaluation 02/23/21    Authorization Type Medicare    PT Start Time 0845    PT Stop Time 0930    PT Time Calculation (min) 45 min    Activity Tolerance Patient tolerated treatment well           Past Medical History:  Diagnosis Date  . Cancer ([place])    lung  . Depression     Past Surgical History:  Procedure Laterality Date  . JOINT REPLACEMENT Left 6.20/2018   hip    There were no vitals filed for this visit.   Subjective Assessment - 01/20/21 0900    Subjective Therapy helps me keep this at [place].  I'm more compliant with my therapy before.  I do my PT religiously,  I swim 3x/week and do my PT ex's 3x/week.    Pertinent History on prednisone ("Jeffery Cobb said recent bone density was fine" ;  stage 4 metastatic lung cancer, adrenal gland damaged;  Lt THA (posterior approach), QUAD damage related to turmor;  ORIF Rt hip;  GOES BY Jeffery Cobb    How long can you stand comfortably? uses walker sometimes (walks dog 2x/day)    Patient Stated Goals live without pain; do everyday activities;  I'm just trying to survive    Currently in Pain? Yes    Pain Location Back              [facility] PT Assessment - 01/20/21 0001      Observation/Other Assessments   Focus on Therapeutic Outcomes (FOTO)  50%      Strength   Overall Strength Comments grossly 4+/5 except right hip abuction 4-/5 and 4-/5 left quads                         [facility] Adult  PT Treatment/Exercise - 01/20/21 0001      Lumbar Exercises: Seated   Other Seated Lumbar Exercises discussion of current ex program for home and swimming; discussed plan of care and management of this chronic issue      Manual Therapy   Manual therapy comments sidelying neutral gapping 5x 30 sec    Soft tissue mobilization In prone and  Lt sidelying  Rt lumbar,QL, glute medius, TFL and proximal gluteals. pillows between LE all the way to foot                    PT Short Term Goals - 01/20/21 1931      PT SHORT TERM GOAL #1   Title independent with intial HEP to maintain mobility and promote pain relief    Status Achieved      PT SHORT TERM GOAL #2   Title report < or = to 4/10 lumbar pain with exercise    Status Achieved             PT Long Term Goals - 01/20/21 1931      PT LONG TERM  GOAL #1   Title independent with HEP and how to progress including aquatic program    Status Partially Met      PT LONG TERM GOAL #2   Title The patient will report overall improvement in quality of life, function and pain control by 25%    Time 12    Period Weeks    Status Partially Met      PT LONG TERM GOAL #3   Title FOTO score improved from 47% to 62%    Time 12    Period Weeks    Status Partially Met      PT LONG TERM GOAL #4   Title understand ways to manage pain with home TENS unit, using device to massage muscles and meditation    Status Achieved                 Plan - 01/20/21 1923    Clinical Impression Statement The patient reports he is "in a really good place right now" and states he's doing much better with compliance with his HEP compared to previous courses of PT.  He is concerned about long term management and we discussed a plan to have a few sessions of aquatic therapy to establish ex's to compliment his lap swimming routine.   If that goes as well as expected, he could continue with this for several months and if he has an exacerbation of back  pain or needs a further progression, I feel confident his MD would send a new referral.  Good response to manual techniques  with improved thoraco-lumbar fascial and soft tissue mobility.  Progressing with goals including an improvement of FOTO from 47% to 50%.    Comorbidities metastatic lung cancer, Lt hip replacement, ORIF on the Rt; on prednisone (daily)    Examination-Activity Limitations Dressing;Stand;Lift;Reach Overhead;Locomotion Level    Stability/Clinical Decision Making Evolving/Moderate complexity    PT Frequency 2x / week    PT Duration 12 weeks    PT Treatment/Interventions Electrical Stimulation;Cryotherapy;Iontophoresis 70m/ml Dexamethasone;Moist Heat;Therapeutic activities;Therapeutic exercise;Neuromuscular re-education;Manual techniques;Patient/family education;Dry needling;Spinal Manipulations;Taping;Aquatic Therapy    PT Next Visit Plan aquatic PT to establish a program in addition to his current lap swimming routine           Patient will benefit from skilled therapeutic intervention in order to improve the following deficits and impairments:  Decreased range of motion,Increased fascial restricitons,Pain,Decreased mobility,Decreased strength,Hypomobility  Visit Diagnosis: Chronic bilateral low back pain without sciatica  Muscle weakness (generalized)  Muscle spasm of back     Problem List Patient Active Problem List   Diagnosis Date Noted  . Pancolitis ([place] 03/29/2017  . C. difficile colitis 03/29/2017  . H/O: lung cancer 03/29/2017  . ICH (intracerebral hemorrhage) ([place] 07/14/2016  . Portal vein thrombosis 07/14/2016  . Short-term memory loss 07/14/2016  . Brain metastases ([place] 07/14/2016  . Metastatic lung cancer (metastasis from lung to other site) ([place] 07/13/2016  . Brain metastasis ([place] 07/13/2016   SRuben Cobb PT 01/20/21 7:35 PM Phone: [phone]: [phone] SAlvera Singh6/10/2020, 7:34 PM  [facility] [address] [place] [address]Phone: [phone]  Fax:  [phone] Name: JRudie SermonsMRN: [id number] of Birth: 03/05/1956/08/30

## 2021-01-24 ENCOUNTER — Ambulatory Visit (HOSPITAL_BASED_OUTPATIENT_CLINIC_OR_DEPARTMENT_OTHER): Payer: PRIVATE HEALTH INSURANCE | Admitting: Physical Therapy

## 2021-01-25 ENCOUNTER — Encounter: Payer: Managed Care, Other (non HMO) | Admitting: Physical Therapy

## 2021-01-27 ENCOUNTER — Ambulatory Visit: Payer: Medicare Other | Admitting: Physical Therapy

## 2021-01-27 ENCOUNTER — Encounter: Payer: Self-pay | Admitting: Physical Therapy

## 2021-01-27 ENCOUNTER — Other Ambulatory Visit: Payer: Self-pay

## 2021-01-27 DIAGNOSIS — M6281 Muscle weakness (generalized): Secondary | ICD-10-CM

## 2021-01-27 DIAGNOSIS — R2689 Other abnormalities of gait and mobility: Secondary | ICD-10-CM

## 2021-01-27 DIAGNOSIS — M545 Low back pain, unspecified: Secondary | ICD-10-CM | POA: Diagnosis not present

## 2021-01-27 DIAGNOSIS — M62830 Muscle spasm of back: Secondary | ICD-10-CM

## 2021-01-27 DIAGNOSIS — G8929 Other chronic pain: Secondary | ICD-10-CM

## 2021-01-27 NOTE — Therapy (Signed)
[facility] [address] [place], [place], [address] Phone: [phone]   Fax:  [phone]  Physical Therapy Treatment  Patient Details  Name: Jeffery Cobb MRN: [id number] Date of Birth: 10/29/1955 Referring Provider (PT): Towanda Malkin PA   Encounter Date: 01/27/2021   PT End of Session - 01/27/21 1655     Visit Number 11    Date for PT Re-Evaluation 02/23/21    Authorization Type Medicare    PT Start Time 1430    PT Stop Time 1515    PT Time Calculation (min) 45 min    Activity Tolerance Patient tolerated treatment well    Behavior During Therapy [facility] for tasks assessed/performed             Past Medical History:  Diagnosis Date   Cancer ([place])    lung   Depression     Past Surgical History:  Procedure Laterality Date   JOINT REPLACEMENT Left 6.20/2018   hip    There were no vitals filed for this visit.   Subjective Assessment - 01/27/21 1653     Subjective I am feeling really good. I noticed I am now doing more things around the house that previously I was not.    Pertinent History on prednisone ("[facility] said recent bone density was fine" ;  stage 4 metastatic lung cancer, adrenal gland damaged;  Lt THA (posterior approach), QUAD damage related to turmor;  ORIF Rt hip;  GOES BY Jeffery Cobb    Limitations Walking;House hold activities    How long can you sit comfortably? as long as I want    Diagnostic tests L2-3 herniated discs; mild OA right hip    Currently in Pain? No/denies    Multiple Pain Sites No             Treatment: Aquatic session. Patient seen for aquatic therapy today.  Treatment took place in water 3.5-4.5 feet deep depending upon activity.  Pt entered the pool via steps with only light use of rails.   Review of current Theraputic exercises pt doing at the [facility]. Pt independent in exercises, minor cuing/reminders for core activation. Added hip flex/ext like a pendulum with emphasis on  a controlled hip extension and hip/knee extension pushing noodle to bottom of floor. Pt completed 10x in neutral position then 10x in hip ER. Also added for balance: high knee marching in narrow BOS. Pt completed 4 lengths of the short end of the pool.   Educated pt how to use flotations to induce decompression if he either has pain or simply wants to prevent pain post workouts. Pt correctly demonstrated and floated for approx 3 min.                             PT Short Term Goals - 01/20/21 1931       PT SHORT TERM GOAL #1   Title independent with intial HEP to maintain mobility and promote pain relief    Status Achieved      PT SHORT TERM GOAL #2   Title report < or = to 4/10 lumbar pain with exercise    Status Achieved               PT Long Term Goals - 01/20/21 1931       PT LONG TERM GOAL #1   Title independent with HEP and how to progress including aquatic program  Status Partially Met      PT LONG TERM GOAL #2   Title The patient will report overall improvement in quality of life, function and pain control by 25%    Time 12    Period Weeks    Status Partially Met      PT LONG TERM GOAL #3   Title FOTO score improved from 47% to 62%    Time 12    Period Weeks    Status Partially Met      PT LONG TERM GOAL #4   Title understand ways to manage pain with home TENS unit, using device to massage muscles and meditation    Status Achieved                   Plan - 01/27/21 1656     Clinical Impression Statement Pt arrives for aquatic PT session today with positve remarks about he has been feeling and how that translates into his daily tasks. Pt was educated in ways to advance his current aquatic/swimming program including adding balance exercises and what to to induce decompression in order for pain relief or a way to end an aquatic session. Pt correectly demonstrated all exercises without pain.    Personal Factors and Comorbidities  Comorbidity 3+    Comorbidities metastatic lung cancer, Lt hip replacement, ORIF on the Rt; on prednisone (daily)    Examination-Activity Limitations Dressing;Stand;Lift;Reach Overhead;Locomotion Level    Examination-Participation Restrictions Community Activity;Occupation    Stability/Clinical Decision Making Evolving/Moderate complexity    Rehab Potential Good    PT Frequency 2x / week    PT Duration 12 weeks    PT Treatment/Interventions Electrical Stimulation;Cryotherapy;Iontophoresis 3m/ml Dexamethasone;Moist Heat;Therapeutic activities;Therapeutic exercise;Neuromuscular re-education;Manual techniques;Patient/family education;Dry needling;Spinal Manipulations;Taping;Aquatic Therapy    PT Next Visit Plan aquatic PT to establish a program in addition to his current lap swimming routine    PT Home Exercise Plan Access Code: [email or web address]  previous code no new ex's given today    Consulted and Agree with Plan of Care Patient             Patient will benefit from skilled therapeutic intervention in order to improve the following deficits and impairments:  Decreased range of motion, Increased fascial restricitons, Pain, Decreased mobility, Decreased strength, Hypomobility  Visit Diagnosis: Chronic bilateral low back pain without sciatica  Muscle weakness (generalized)  Muscle spasm of back  Other abnormalities of gait and mobility     Problem List Patient Active Problem List   Diagnosis Date Noted   Pancolitis ([place] 03/29/2017   C. difficile colitis 03/29/2017   H/O: lung cancer 03/29/2017   ICH (intracerebral hemorrhage) ([place] 07/14/2016   Portal vein thrombosis 07/14/2016   Short-term memory loss 07/14/2016   Brain metastases ([place] 07/14/2016   Metastatic lung cancer (metastasis from lung to other site) ([place] 07/13/2016   Brain metastasis ([place] 07/13/2016    Jeffery Cobb, PTA 01/27/2021, 5:02 PM  [id number] [place] [address]Phone: [phone]  Fax:  [phone] Name: Jeffery SisemoreMRN: [id number] of Birth: [birth year]

## 2021-01-30 ENCOUNTER — Other Ambulatory Visit: Payer: Managed Care, Other (non HMO)

## 2021-01-30 ENCOUNTER — Encounter: Payer: Managed Care, Other (non HMO) | Admitting: Physical Therapy

## 2021-01-31 ENCOUNTER — Encounter (HOSPITAL_BASED_OUTPATIENT_CLINIC_OR_DEPARTMENT_OTHER): Payer: Self-pay | Admitting: Physical Therapy

## 2021-01-31 ENCOUNTER — Other Ambulatory Visit: Payer: Self-pay

## 2021-01-31 ENCOUNTER — Ambulatory Visit (HOSPITAL_BASED_OUTPATIENT_CLINIC_OR_DEPARTMENT_OTHER): Payer: Medicare Other | Attending: Physician Assistant | Admitting: Physical Therapy

## 2021-01-31 DIAGNOSIS — R2689 Other abnormalities of gait and mobility: Secondary | ICD-10-CM | POA: Diagnosis present

## 2021-01-31 DIAGNOSIS — M25551 Pain in right hip: Secondary | ICD-10-CM | POA: Insufficient documentation

## 2021-01-31 DIAGNOSIS — M545 Low back pain, unspecified: Secondary | ICD-10-CM | POA: Insufficient documentation

## 2021-01-31 DIAGNOSIS — M6281 Muscle weakness (generalized): Secondary | ICD-10-CM

## 2021-01-31 DIAGNOSIS — R252 Cramp and spasm: Secondary | ICD-10-CM | POA: Diagnosis present

## 2021-01-31 DIAGNOSIS — G8929 Other chronic pain: Secondary | ICD-10-CM | POA: Diagnosis present

## 2021-01-31 DIAGNOSIS — M62830 Muscle spasm of back: Secondary | ICD-10-CM | POA: Diagnosis present

## 2021-01-31 NOTE — Therapy (Signed)
[place], [place], [address] Phone: [phone]   Fax:  [phone]  Physical Therapy Treatment  Patient Details  Name: Jeffery Cobb MRN: [id number] Date of Birth: 1955-12-16 Referring Provider (PT): Jeffery Malkin PA   Encounter Date: 01/31/2021   PT End of Session - 01/31/21 1238     Visit Number 12    Date for PT Re-Evaluation 02/23/21    Authorization Type Medicare    PT Start Time 0945    PT Stop Time 8315    PT Time Calculation (min) 47 min    Equipment Utilized During Treatment Other (comment)    Activity Tolerance Patient tolerated treatment well             Past Medical History:  Diagnosis Date   Cancer ([place])    lung   Depression     Past Surgical History:  Procedure Laterality Date   JOINT REPLACEMENT Left 6.20/2018   hip    There were no vitals filed for this visit.   Subjective Assessment - 01/31/21 1234     Subjective I feel stronger. I want to go over the new exercises to make sure I am doing them properly. I only have a few more visits left then I will completing all on my own    Pertinent History on prednisone ("[facility] said recent bone density was fine" ;  stage 4 metastatic lung cancer, adrenal gland damaged;  Lt THA (posterior approach), QUAD damage related to turmor;  ORIF Rt hip;  GOES BY Jeffery Cobb    Limitations Walking;House hold activities             Pt seen for aquatic therapy today.  Treatment took place in water 3.25-4.8 ft in depth at the [facility] pool. Temp of water was 91.  Pt entered/exited the pool via stairs step through pattern independently with bilat rail.  Warm up: water walking forward, back and sidestepping Pt completes exercises with continued minor cuing for core activation.  Added foam cuffs for resistance on all exercises which he completes 3 x 10 reps.  Standing core strengthing and stretching, hip extension with noodle, f/b knees  to wall x 10 reps. Superman position on ladder knees to chest then core extension, jack knife x 10 reps. Noodle pushdown in hip flex and then with external hip rotation. Progressed to without ue support. Cueing for core activation  Pt requires buoyancy for support and to offload joints with strengthening exercises. Viscosity of the water is needed for resistance of strengthening; water current perturbations provides challenge to standing balance unsupported, requiring increased core activation.                            PT Education - 01/31/21 1236     Education Details Added foam cuff with all exercises for added resistance and challenge. Pt considering purchasing.    Person(s) Educated Patient              PT Short Term Goals - 01/20/21 1931       PT SHORT TERM GOAL #1   Title independent with intial HEP to maintain mobility and promote pain relief    Status Achieved      PT SHORT TERM GOAL #2   Title report < or = to 4/10 lumbar pain with exercise    Status Achieved  PT Long Term Goals - 01/20/21 1931       PT LONG TERM GOAL #1   Title independent with HEP and how to progress including aquatic program    Status Partially Met      PT LONG TERM GOAL #2   Title The patient will report overall improvement in quality of life, function and pain control by 25%    Time 12    Period Weeks    Status Partially Met      PT LONG TERM GOAL #3   Title FOTO score improved from 47% to 62%    Time 12    Period Weeks    Status Partially Met      PT LONG TERM GOAL #4   Title understand ways to manage pain with home TENS unit, using device to massage muscles and meditation    Status Achieved                   Plan - 01/31/21 1239     Clinical Impression Statement Pt advancing well with strengthening of LE and core.  Added resistance of foam cuffs and core str/balance challenges to program.  Pt reports feeling challenged.  Does  has some difficulty maintaining/completing advanced challenges.  Instructed to work on at [facility].    Personal Factors and Comorbidities Comorbidity 3+    Comorbidities metastatic lung cancer, Lt hip replacement, ORIF on the Rt; on prednisone (daily)    Examination-Activity Limitations Dressing;Stand;Lift;Reach Overhead;Locomotion Level    Examination-Participation Restrictions Community Activity;Occupation    Stability/Clinical Decision Making Evolving/Moderate complexity    Rehab Potential Good    PT Frequency 2x / week    PT Duration 12 weeks    PT Treatment/Interventions Electrical Stimulation;Cryotherapy;Iontophoresis 4mg/ml Dexamethasone;Moist Heat;Therapeutic activities;Therapeutic exercise;Neuromuscular re-education;Manual techniques;Patient/family education;Dry needling;Spinal Manipulations;Taping;Aquatic Therapy    PT Next Visit Plan Check indep with new ex.             Patient will benefit from skilled therapeutic intervention in order to improve the following deficits and impairments:  Decreased range of motion, Increased fascial restricitons, Pain, Decreased mobility, Decreased strength, Hypomobility  Visit Diagnosis: Chronic bilateral low back pain without sciatica  Muscle weakness (generalized)  Muscle spasm of back  Other abnormalities of gait and mobility  Pain in right hip  Cramp and spasm     Problem List Patient Active Problem List   Diagnosis Date Noted   Pancolitis (HCC) 03/29/2017   C. difficile colitis 03/29/2017   H/O: lung cancer 03/29/2017   ICH (intracerebral hemorrhage) (HCC) 07/14/2016   Portal vein thrombosis 07/14/2016   Short-term memory loss 07/14/2016   Brain metastases (HCC) 07/14/2016   Metastatic lung cancer (metastasis from lung to other site) (HCC) 07/13/2016   Brain metastasis (HCC) 07/13/2016     F  MPT 01/31/2021, 1:54 PM  [facility] [address] [place], [place],  [address] Phone: [phone]   Fax:  [phone]  Name: Jeffery Cobb MRN: [id number] Date of Birth: 04/16/1956    

## 2021-02-02 ENCOUNTER — Encounter: Payer: Managed Care, Other (non HMO) | Admitting: Physical Therapy

## 2021-02-07 ENCOUNTER — Encounter: Payer: Managed Care, Other (non HMO) | Admitting: Physical Therapy

## 2021-02-07 ENCOUNTER — Other Ambulatory Visit: Payer: Self-pay

## 2021-02-07 ENCOUNTER — Encounter (HOSPITAL_BASED_OUTPATIENT_CLINIC_OR_DEPARTMENT_OTHER): Payer: Self-pay | Admitting: Physical Therapy

## 2021-02-07 ENCOUNTER — Ambulatory Visit (HOSPITAL_BASED_OUTPATIENT_CLINIC_OR_DEPARTMENT_OTHER): Payer: Medicare Other | Admitting: Physical Therapy

## 2021-02-07 DIAGNOSIS — G8929 Other chronic pain: Secondary | ICD-10-CM

## 2021-02-07 DIAGNOSIS — M62830 Muscle spasm of back: Secondary | ICD-10-CM

## 2021-02-07 DIAGNOSIS — M25551 Pain in right hip: Secondary | ICD-10-CM

## 2021-02-07 DIAGNOSIS — R252 Cramp and spasm: Secondary | ICD-10-CM

## 2021-02-07 DIAGNOSIS — M6281 Muscle weakness (generalized): Secondary | ICD-10-CM

## 2021-02-07 DIAGNOSIS — M545 Low back pain, unspecified: Secondary | ICD-10-CM | POA: Diagnosis not present

## 2021-02-07 DIAGNOSIS — R2689 Other abnormalities of gait and mobility: Secondary | ICD-10-CM

## 2021-02-07 NOTE — Therapy (Addendum)
[place], [place], [address] Phone: [phone]   Fax:  [phone]  Physical Therapy Treatment  Patient Details  Name: Jeffery Cobb MRN: [id number] Date of Birth: [birth year] Referring Provider (PT): Towanda Malkin PA   Encounter Date: 02/07/2021 Subjective Last session was great, had a little soarness.  Was able to help my dtr move over the weekend.  I did well.  Pain: 2/10 right lower back;chronic   Past Medical History:  Diagnosis Date   Cancer ([place])    lung   Depression     Past Surgical History:  Procedure Laterality Date   JOINT REPLACEMENT Left 6.20/2018   hip    There were no vitals filed for this visit.     Pt seen for aquatic therapy today.  Treatment took place in water 3.25-4.8 ft in depth at the [facility] pool. Temp of water was 91.  Pt entered/exited the pool via stairs step through pattern independently with bilat rail.   Warm up: water walking forward, back and sidestepping Pt completes exercises with continued minor cuing for core activation. Foam ankle cuffs for resistance on all exercises which he completes 2 x 15 reps. Standing core strengthing and stretching, hip extension with sqoodle, f/b knees to wall x 12 reps. Superman position on ladder knees to chest for LB stretch x 3 trials of 20 seconds. Core flex and extension, jack knife and then knees to opposite shoulder x 10 reps each suspended by squoodle and anchored by therapist.    Jeffery Cobb kick down ue supported by barbells 3 x 10 reps   Pt requires buoyancy for support and to offload joints with strengthening exercises. Viscosity of the water is needed for resistance of strengthening; water current perturbations provides challenge to standing balance unsupported, requiring increased core activation.                             PT Short Term Goals - 02/23/21 1740       PT SHORT TERM  GOAL #1   Title independent with intial HEP to maintain mobility and promote pain relief    Status Achieved      PT SHORT TERM GOAL #2   Title report < or = to 4/10 lumbar pain with exercise    Status Achieved               PT Long Term Goals - 02/23/21 1740       PT LONG TERM GOAL #1   Title independent with HEP and how to progress including aquatic program    Time 12    Period Weeks    Status Partially Met    Target Date 05/18/21      PT LONG TERM GOAL #2   Title The patient will report overall improvement in quality of life, function and pain control by 50%    Time 12    Period Weeks    Status Revised      PT LONG TERM GOAL #3   Title FOTO score improved from 47% to 62%    Time 12    Period Weeks    Status Partially Met      PT LONG TERM GOAL #4   Title understand ways to manage pain with home TENS unit, using device to massage muscles and meditation    Status Achieved      PT LONG TERM GOAL #5  Title LE strength including hip abductors, extensors, knee extensors grossly 4+/5 needed for lifting grocery bags, negotiating steps    Time 12    Period Weeks    Status New                    Patient will benefit from skilled therapeutic intervention in order to improve the following deficits and impairments:  Decreased range of motion, Increased fascial restricitons, Pain, Decreased mobility, Decreased strength, Hypomobility  Visit Diagnosis: Chronic bilateral low back pain without sciatica  Pain in right hip  Cramp and spasm  Muscle weakness (generalized)  Muscle spasm of back  Other abnormalities of gait and mobility  Clinical Assessment    Clinical Impression Statement Pt engages well with upper level core strengthening and balance challenges. With left glute palpation definte ms contration appreciated in atrophied area. Pt reports and dmonstrates compliance with HEP as evidenced by improved muslce strength and ability to complete higher  level challenges.        Problem List Patient Active Problem List   Diagnosis Date Noted   Pancolitis ([place]) 03/29/2017   C. difficile colitis 03/29/2017   H/O: lung cancer 03/29/2017   ICH (intracerebral hemorrhage) ([place]) 07/14/2016   Portal vein thrombosis 07/14/2016   Short-term memory loss 07/14/2016   Brain metastases ([place]) 07/14/2016   Metastatic lung cancer (metastasis from lung to other site) [facility]) 07/13/2016   Brain metastasis ([place]) 07/13/2016    Vedia Pereyra MPT 02/28/2021, 4:53 PM  [facility] [place], [place], [address] Phone: [phone]   Fax:  [phone]  Name: Jeffery Cobb MRN: [id number] Date of Birth: June 08, 1956  Addended Jeffery Cobb) Jeffery Cobb MPT 03/02/21

## 2021-02-10 ENCOUNTER — Encounter: Payer: Managed Care, Other (non HMO) | Admitting: Physical Therapy

## 2021-02-13 ENCOUNTER — Encounter: Payer: Self-pay | Admitting: Physical Therapy

## 2021-02-13 ENCOUNTER — Ambulatory Visit: Payer: Medicare Other | Admitting: Physical Therapy

## 2021-02-13 ENCOUNTER — Other Ambulatory Visit: Payer: Self-pay

## 2021-02-13 DIAGNOSIS — G8929 Other chronic pain: Secondary | ICD-10-CM

## 2021-02-13 DIAGNOSIS — R252 Cramp and spasm: Secondary | ICD-10-CM

## 2021-02-13 DIAGNOSIS — M25551 Pain in right hip: Secondary | ICD-10-CM

## 2021-02-13 DIAGNOSIS — M6281 Muscle weakness (generalized): Secondary | ICD-10-CM

## 2021-02-13 DIAGNOSIS — M545 Low back pain, unspecified: Secondary | ICD-10-CM | POA: Diagnosis not present

## 2021-02-13 NOTE — Therapy (Signed)
[facility] [address] [place], [place], [address] Phone: [phone]   Fax:  [phone]  Physical Therapy Treatment  Patient Details  Name: Jeffery Cobb MRN: [id number] Date of Birth: October 11, 1955 Referring Provider (PT): Towanda Malkin PA   Encounter Date: 02/13/2021   PT End of Session - 02/13/21 0805     Visit Number 14    Date for PT Re-Evaluation 02/23/21    Authorization Type Medicare    PT Start Time 0800    PT Stop Time 0841    PT Time Calculation (min) 41 min    Activity Tolerance Patient tolerated treatment well    Behavior During Therapy [facility] for tasks assessed/performed             Past Medical History:  Diagnosis Date   Cancer ([place])    lung   Depression     Past Surgical History:  Procedure Laterality Date   JOINT REPLACEMENT Left 6.20/2018   hip    There were no vitals filed for this visit.   Subjective Assessment - 02/13/21 0806     Subjective When I have back pain I know how to "work with it."    Pertinent History on prednisone ("[facility] said recent bone density was fine" ;  stage 4 metastatic lung cancer, adrenal gland damaged;  Lt THA (posterior approach), QUAD damage related to turmor;  ORIF Rt hip;  [place]    Currently in Pain? Yes    Pain Score 1     Pain Location Back    Pain Orientation Lower    Pain Descriptors / Indicators Dull    Aggravating Factors  Overdoing it    Pain Relieving Factors Being in the water    Multiple Pain Sites No                               [facility] Adult PT Treatment/Exercise - 02/13/21 0001       Manual Therapy   Soft tissue mobilization In prone and  Lt sidelying  Rt lumbar,QL, glute medius, TFL and proximal gluteals. pillows between LE all the way to foot                      PT Short Term Goals - 01/20/21 1931       PT SHORT TERM GOAL #1   Title independent with intial HEP to maintain mobility and  promote pain relief    Status Achieved      PT SHORT TERM GOAL #2   Title report < or = to 4/10 lumbar pain with exercise    Status Achieved               PT Long Term Goals - 01/20/21 1931       PT LONG TERM GOAL #1   Title independent with HEP and how to progress including aquatic program    Status Partially Met      PT LONG TERM GOAL #2   Title The patient will report overall improvement in quality of life, function and pain control by 25%    Time 12    Period Weeks    Status Partially Met      PT LONG TERM GOAL #3   Title FOTO score improved from 47% to 62%    Time 12    Period Weeks    Status Partially Met  PT LONG TERM GOAL #4   Title understand ways to manage pain with home TENS unit, using device to massage muscles and meditation    Status Achieved                   Plan - 02/13/21 0807     Clinical Impression Statement Pt arrives with minimal pain and vebally reports being able to better navigate through any back pain he may have. He reports feeling challenged appropriately in his new aquatic exercises and is looking forward to working on them and getting 'better at them." Deep trigger point work to RT glute medius today, done intermittently due to painful nature of the deep work. Resting tone continues to improve in his low back muscles.    Personal Factors and Comorbidities Comorbidity 3+    Comorbidities metastatic lung cancer, Lt hip replacement, ORIF on the Rt; on prednisone (daily)    Examination-Activity Limitations Dressing;Stand;Lift;Reach Overhead;Locomotion Level    Examination-Participation Restrictions Community Activity;Occupation    Stability/Clinical Decision Making Evolving/Moderate complexity    Rehab Potential Good    PT Frequency 2x / week    PT Duration 12 weeks    PT Treatment/Interventions Electrical Stimulation;Cryotherapy;Iontophoresis 35m/ml Dexamethasone;Moist Heat;Therapeutic activities;Therapeutic  exercise;Neuromuscular re-education;Manual techniques;Patient/family education;Dry needling;Spinal Manipulations;Taping;Aquatic Therapy    PT Next Visit Plan Assess next session. Consider 1x week going between aquatics/land until auth period expires ( check visits allowance). review goals    PT Home Exercise Plan Access Code: [email or web address]  previous code no new ex's given today    Consulted and Agree with Plan of Care Patient             Patient will benefit from skilled therapeutic intervention in order to improve the following deficits and impairments:  Decreased range of motion, Increased fascial restricitons, Pain, Decreased mobility, Decreased strength, Hypomobility  Visit Diagnosis: Chronic bilateral low back pain without sciatica  Pain in right hip  Cramp and spasm  Muscle weakness (generalized)     Problem List Patient Active Problem List   Diagnosis Date Noted   Pancolitis ([place] 03/29/2017   C. difficile colitis 03/29/2017   H/O: lung cancer 03/29/2017   ICH (intracerebral hemorrhage) ([place] 07/14/2016   Portal vein thrombosis 07/14/2016   Short-term memory loss 07/14/2016   Brain metastases ([place] 07/14/2016   Metastatic lung cancer (metastasis from lung to other site) ([place] 07/13/2016   Brain metastasis ([place] 07/13/2016    Jeffery Cobb, PTA 02/13/2021, 11:24 AM  [facility] [address] [place] [address]: [phone]  Fax:  [phone] Name: Jeffery PughMRN: [id number] of Birth: [birth year]

## 2021-02-14 ENCOUNTER — Ambulatory Visit (HOSPITAL_BASED_OUTPATIENT_CLINIC_OR_DEPARTMENT_OTHER): Payer: Medicare Other | Admitting: Physical Therapy

## 2021-02-14 ENCOUNTER — Encounter (HOSPITAL_BASED_OUTPATIENT_CLINIC_OR_DEPARTMENT_OTHER): Payer: Self-pay | Admitting: Physical Therapy

## 2021-02-14 DIAGNOSIS — M545 Low back pain, unspecified: Secondary | ICD-10-CM

## 2021-02-14 DIAGNOSIS — R2689 Other abnormalities of gait and mobility: Secondary | ICD-10-CM

## 2021-02-14 DIAGNOSIS — M62830 Muscle spasm of back: Secondary | ICD-10-CM

## 2021-02-14 DIAGNOSIS — R252 Cramp and spasm: Secondary | ICD-10-CM

## 2021-02-14 DIAGNOSIS — M6281 Muscle weakness (generalized): Secondary | ICD-10-CM

## 2021-02-14 DIAGNOSIS — M25551 Pain in right hip: Secondary | ICD-10-CM

## 2021-02-14 NOTE — Therapy (Signed)
[place], [place], [address] Phone: [phone]   Fax:  [phone]  Physical Therapy Treatment  Patient Details  Name: Jeffery Cobb MRN: [id number] Date of Birth: 1956-02-23 Referring Provider (PT): Towanda Malkin PA   Encounter Date: 02/14/2021   PT End of Session - 02/14/21 1005     Visit Number 15    Date for PT Re-Evaluation 02/23/21    Authorization Type Medicare    PT Start Time 0945    PT Stop Time 1030    PT Time Calculation (min) 45 min    Equipment Utilized During Treatment Other (comment)    Activity Tolerance Patient tolerated treatment well             Past Medical History:  Diagnosis Date   Cancer ([place])    lung   Depression     Past Surgical History:  Procedure Laterality Date   JOINT REPLACEMENT Left 6.20/2018   hip    There were no vitals filed for this visit.   Subjective Assessment - 02/14/21 1344     Subjective My left knee and hip have been bothering me some, I think from maybe overdoing it with my home exercises.    Pertinent History on prednisone ("[facility] said recent bone density was fine" ;  stage 4 metastatic lung cancer, adrenal gland damaged;  Lt THA (posterior approach), QUAD damage related to turmor;  ORIF Rt hip;  [place]    Limitations Walking;House hold activities    How long can you sit comfortably? as long as I want    How long can you stand comfortably? undefined    How long can you walk comfortably? 20 mins    Diagnostic tests L2-3 herniated discs; mild OA right hip    Patient Stated Goals live without pain; do everyday activities;  I'm just trying to survive    Currently in Pain? Yes    Pain Score 1     Pain Location Back    Pain Orientation Left    Pain Descriptors / Indicators Dull            Pt seen for aquatic therapy today.  Treatment took place in water 3.25-4.8 ft in depth at the [facility] pool. Temp of water was 91.   Pt entered/exited the pool via stairs step through pattern independently with bilat rail.   Warm up: water walking forward, back and sidestepping Pt completes exercises with continued minor cuing for core activation. Foam ankle cuffs for resistance on all exercises which he completes 2 x 15 reps. Standing core strengthing and stretching, hip extension with sqoodle, f/b knees forward advancing to using barbells for support 3 x10 reps. Superman position on ladder knees to chest for LB stretch x 3 trials of 20  seconds. Core flex and extension, jack knife and then knees to opposite shoulder x 10 reps each suspended by squoodle and anchored by therapist.   Aerobic capacity training: interval sbalancing on squoodle bicycling 3 x 30 sec rounds.   Pt requires buoyancy for support and to offload joints with strengthening exercises. Viscosity of the water is needed for resistance of strengthening; water current perturbations provides challenge to standing balance unsupported, requiring increased core activation.                           PT Education - 02/14/21 1349     Person(s) Educated Patient  Methods Explanation              PT Short Term Goals - 01/20/21 1931       PT SHORT TERM GOAL #1   Title independent with intial HEP to maintain mobility and promote pain relief    Status Achieved      PT SHORT TERM GOAL #2   Title report < or = to 4/10 lumbar pain with exercise    Status Achieved               PT Long Term Goals - 01/20/21 1931       PT LONG TERM GOAL #1   Title independent with HEP and how to progress including aquatic program    Status Partially Met      PT LONG TERM GOAL #2   Title The patient will report overall improvement in quality of life, function and pain control by 25%    Time 12    Period Weeks    Status Partially Met      PT LONG TERM GOAL #3   Title FOTO score improved from 47% to 62%    Time 12    Period Weeks    Status  Partially Met      PT LONG TERM GOAL #4   Title understand ways to manage pain with home TENS unit, using device to massage muscles and meditation    Status Achieved                   Plan - 02/14/21 1355     Clinical Impression Statement Pt amb with fww well indep to aquatic area.  Parks walker by wall and amb safely without AD to steps.  Focus of treatment today since pt a little late is stretching and strengtheing left knee in as painfree range as possible.  He demonstartes improvement in core strength as able to complete challenges with less ue support    Personal Factors and Comorbidities Comorbidity 3+    Comorbidities metastatic lung cancer, Lt hip replacement, ORIF on the Rt; on prednisone (daily)    Examination-Activity Limitations Dressing;Stand;Lift;Reach Overhead;Locomotion Level    Examination-Participation Restrictions Community Activity;Occupation    Stability/Clinical Decision Making Evolving/Moderate complexity    Clinical Decision Making Moderate    Rehab Potential Good    PT Frequency 2x / week    PT Duration 12 weeks    PT Treatment/Interventions Electrical Stimulation;Cryotherapy;Iontophoresis 23m/ml Dexamethasone;Moist Heat;Therapeutic activities;Therapeutic exercise;Neuromuscular re-education;Manual techniques;Patient/family education;Dry needling;Spinal Manipulations;Taping;Aquatic Therapy             Patient will benefit from skilled therapeutic intervention in order to improve the following deficits and impairments:  Decreased range of motion, Increased fascial restricitons, Pain, Decreased mobility, Decreased strength, Hypomobility  Visit Diagnosis: Chronic bilateral low back pain without sciatica  Pain in right hip  Cramp and spasm  Muscle weakness (generalized)  Muscle spasm of back  Other abnormalities of gait and mobility     Problem List Patient Active Problem List   Diagnosis Date Noted   Pancolitis ([place] 03/29/2017   C.  difficile colitis 03/29/2017   H/O: lung cancer 03/29/2017   ICH (intracerebral hemorrhage) ([place] 07/14/2016   Portal vein thrombosis 07/14/2016   Short-term memory loss 07/14/2016   Brain metastases ([place] 07/14/2016   Metast[facility] ([facility] other site) ([facility] 07/13/2016   Brain metastasis ([place] 07/13/2016    MVedia Pereyra6/28/2022, 1:59 PM  [facility]  [place], [place], [address] Phone: [phone]   Fax:  [phone]  Name: Jeffery Cobb MRN: [id number] Date of Birth: [birth year]

## 2021-02-23 ENCOUNTER — Ambulatory Visit: Payer: Medicare Other | Attending: Physician Assistant | Admitting: Physical Therapy

## 2021-02-23 ENCOUNTER — Other Ambulatory Visit: Payer: Self-pay

## 2021-02-23 DIAGNOSIS — G8929 Other chronic pain: Secondary | ICD-10-CM | POA: Insufficient documentation

## 2021-02-23 DIAGNOSIS — M62830 Muscle spasm of back: Secondary | ICD-10-CM | POA: Diagnosis present

## 2021-02-23 DIAGNOSIS — R252 Cramp and spasm: Secondary | ICD-10-CM | POA: Insufficient documentation

## 2021-02-23 DIAGNOSIS — M25551 Pain in right hip: Secondary | ICD-10-CM | POA: Diagnosis present

## 2021-02-23 DIAGNOSIS — M545 Low back pain, unspecified: Secondary | ICD-10-CM | POA: Diagnosis present

## 2021-02-23 DIAGNOSIS — M6281 Muscle weakness (generalized): Secondary | ICD-10-CM | POA: Diagnosis present

## 2021-02-23 DIAGNOSIS — R2689 Other abnormalities of gait and mobility: Secondary | ICD-10-CM | POA: Diagnosis present

## 2021-02-23 NOTE — Therapy (Signed)
[facility] [address] [place], [place], [address] Phone: [phone]   Fax:  [phone]  Physical Therapy Treatment/Recertification   Patient Details  Name: Jeffery Cobb MRN: [id number] Date of Birth: 1956-07-05 Referring Provider (PT): Towanda Malkin PA   Encounter Date: 02/23/2021   PT End of Session - 02/23/21 1732     Visit Number 16    Date for PT Re-Evaluation 05/18/21    Authorization Type Medicare    PT Start Time 8916    PT Stop Time 1530    PT Time Calculation (min) 45 min    Activity Tolerance Patient tolerated treatment well             Past Medical History:  Diagnosis Date   Cancer ([place])    lung   Depression     Past Surgical History:  Procedure Laterality Date   JOINT REPLACEMENT Left 6.20/2018   hip    There were no vitals filed for this visit.   Subjective Assessment - 02/23/21 1447     Subjective Swimming laps 3 days a week.  Doing aquatic ex and that's been helpful. I've learned things I've never thought of and that's helping.  The back pain is still there and I know I need to ex regularly.  I don't want to go back to where I was in January.    Pertinent History on prednisone ("[facility] said recent bone density was fine" ;  stage 4 metastatic lung cancer, adrenal gland damaged;  Lt THA (posterior approach), QUAD damage related to turmor;  ORIF Rt hip;  [place]    Currently in Pain? Yes    Pain Score 1     Pain Location Back    Pain Orientation Left                [facility] PT Assessment - 02/23/21 0001       Assessment   Medical Diagnosis lumbar disc disease with radiculopathy    Referring Provider (PT) Towanda Malkin PA      Observation/Other Assessments   Focus on Therapeutic Outcomes (FOTO)  54%      Strength   Right Hip Flexion 5/5    Right Hip Extension 4-/5    Right Hip ABduction 4/5    Right Hip ADduction 4+/5    Left Hip Flexion 5/5    Left Hip Extension  4-/5    Left Hip External Rotation 4/5    Left Hip Internal Rotation 4/5    Left Hip ABduction 4/5    Left Hip ADduction 5/5                           [facility] Adult PT Treatment/Exercise - 02/23/21 0001       Lumbar Exercises: Seated   Other Seated Lumbar Exercises discussion of current ex program for home and swimming; discussed plan of care and management of this chronic issue    Other Seated Lumbar Exercises discussion of progress based on objective findings; determined the need for tapering of visits for continued progression and prevent or slow any loss of function      Manual Therapy   Soft tissue mobilization In prone and  Lt sidelying  Rt lumbar,QL, glute medius, TFL and proximal gluteals. pillows between LE all the way to foot  PT Short Term Goals - 02/23/21 1740       PT SHORT TERM GOAL #1   Title independent with intial HEP to maintain mobility and promote pain relief    Status Achieved      PT SHORT TERM GOAL #2   Title report < or = to 4/10 lumbar pain with exercise    Status Achieved               PT Long Term Goals - 02/23/21 1740       PT LONG TERM GOAL #1   Title independent with HEP and how to progress including aquatic program    Time 12    Period Weeks    Status Partially Met    Target Date 05/18/21      PT LONG TERM GOAL #2   Title The patient will report overall improvement in quality of life, function and pain control by 50%    Time 12    Period Weeks    Status Revised      PT LONG TERM GOAL #3   Title FOTO score improved from 47% to 62%    Time 12    Period Weeks    Status Partially Met      PT LONG TERM GOAL #4   Title understand ways to manage pain with home TENS unit, using device to massage muscles and meditation    Status Achieved      PT LONG TERM GOAL #5   Title LE strength including hip abductors, extensors, knee extensors grossly 4+/5 needed for lifting grocery bags,  negotiating steps    Time 12    Period Weeks    Status New                   Plan - 02/23/21 1733     Clinical Impression Statement The patient reports signficant benefit from aquatic PT, manual therapy and limited land based therapeutic exercise.  In the water environment he benefits from the buoyancy, hydrostatic pressure and viscocity to achieve a higher level of exercise without pain exacerbation that would otherwise be intolerable on land.  His FOTO outcome score continues to steadily improve but still indicates moderate difficulty performing household activities.  LE strength gains by 1/2 MMT grade.  Due to his signficant co-morbidities, recommend continued PT at a reduced frequency to biweekly to maintain the improvements in his current condition and promote independence with an aquatic ex program    Comorbidities metastatic lung cancer, Lt hip replacement, ORIF on the Rt; on prednisone (daily)    Examination-Activity Limitations Dressing;Stand;Lift;Reach Overhead;Locomotion Level    Examination-Participation Restrictions Community Activity;Occupation    Rehab Potential Good    PT Frequency Biweekly    PT Duration 12 weeks    PT Treatment/Interventions Electrical Stimulation;Cryotherapy;Iontophoresis 22m/ml Dexamethasone;Moist Heat;Therapeutic activities;Therapeutic exercise;Neuromuscular re-education;Manual techniques;Patient/family education;Dry needling;Spinal Manipulations;Taping;Aquatic Therapy    PT Next Visit Plan biweekly aquatic 4 more sessions then follow up in clinic    PT Home Exercise Plan Access Code: [email or web address]  previous code no new ex's given today             Patient will benefit from skilled therapeutic intervention in order to improve the following deficits and impairments:  Decreased range of motion, Increased fascial restricitons, Pain, Decreased mobility, Decreased strength, Hypomobility  Visit Diagnosis: Chronic bilateral low back pain without  sciatica - Plan: PT plan of care cert/re-cert  Pain in right hip - Plan: PT plan of care cert/re-cert  Cramp and spasm - Plan: PT plan of care cert/re-cert  Muscle weakness (generalized) - Plan: PT plan of care cert/re-cert     Problem List Patient Active Problem List   Diagnosis Date Noted   Pancolitis ([place]) 03/29/2017   C. difficile colitis 03/29/2017   H/O: lung cancer 03/29/2017   ICH (intracerebral hemorrhage) ([place]) 07/14/2016   Portal vein thrombosis 07/14/2016   Short-term memory loss 07/14/2016   Brain metastases ([place]) 07/14/2016   Metastatic lung cancer (metastasis from lung to other site) [facility]) 07/13/2016   Brain metastasis ([place]) 07/13/2016   Ruben Im, PT 02/23/21 5:44 PM Phone: [phone] Fax: [phone]  Alvera Singh 02/23/2021, 5:43 PM  [facility] [address], [place], [place], [address] Phone: [phone]   Fax:  [phone]  Name: Sebastyan Snodgrass MRN: [id number] Date of Birth: [birth year]

## 2021-03-02 ENCOUNTER — Ambulatory Visit: Payer: Medicare Other | Admitting: Physical Therapy

## 2021-03-02 ENCOUNTER — Other Ambulatory Visit: Payer: Self-pay

## 2021-03-02 DIAGNOSIS — M545 Low back pain, unspecified: Secondary | ICD-10-CM | POA: Diagnosis not present

## 2021-03-02 DIAGNOSIS — M25551 Pain in right hip: Secondary | ICD-10-CM

## 2021-03-02 DIAGNOSIS — R252 Cramp and spasm: Secondary | ICD-10-CM

## 2021-03-02 DIAGNOSIS — G8929 Other chronic pain: Secondary | ICD-10-CM

## 2021-03-02 NOTE — Therapy (Signed)
[facility] [address] [place], [place], [address] Phone: [phone]   Fax:  [phone]  Physical Therapy Treatment  Patient Details  Name: Jeffery Cobb MRN: [id number] Date of Birth: 1955-09-08 Referring Provider (PT): Towanda Malkin PA   Encounter Date: 03/02/2021   PT End of Session - 03/02/21 1144     Visit Number 17    Date for PT Re-Evaluation 05/18/21    Authorization Type Medicare    PT Start Time 1108    PT Stop Time 1147   heat   PT Time Calculation (min) 39 min    Activity Tolerance Patient tolerated treatment well             Past Medical History:  Diagnosis Date   Cancer ([place])    lung   Depression     Past Surgical History:  Procedure Laterality Date   JOINT REPLACEMENT Left 6.20/2018   hip    There were no vitals filed for this visit.   Subjective Assessment - 03/02/21 1142     Subjective The patient complains of acute spasm right lower back upon awakening this morning.  Was able to do pool workout but limited this morning.    Diagnostic tests L2-3 herniated discs; mild OA right hip    Currently in Pain? Yes    Pain Score 5     Pain Location Back    Pain Orientation Right    Pain Type Chronic pain                               [facility] Adult PT Treatment/Exercise - 03/02/21 0001       Moist Heat Therapy   Number Minutes Moist Heat 5 Minutes    Moist Heat Location Lumbar Spine      Manual Therapy   Soft tissue mobilization In prone and  Lt sidelying  Rt lumbar,QL, glute medius, TFL and proximal gluteals. pillows between LE all the way to foot; lumbar neutral gapping grade 2/3 in sidelying;  QL trigger point release 3 min                     PT Short Term Goals - 02/23/21 1740       PT SHORT TERM GOAL #1   Title independent with intial HEP to maintain mobility and promote pain relief    Status Achieved      PT SHORT TERM GOAL #2    Title report < or = to 4/10 lumbar pain with exercise    Status Achieved               PT Long Term Goals - 02/23/21 1740       PT LONG TERM GOAL #1   Title independent with HEP and how to progress including aquatic program    Time 12    Period Weeks    Status Partially Met    Target Date 05/18/21      PT LONG TERM GOAL #2   Title The patient will report overall improvement in quality of life, function and pain control by 50%    Time 12    Period Weeks    Status Revised      PT LONG TERM GOAL #3   Title FOTO score improved from 47% to 62%    Time 12    Period Weeks    Status Partially Met  PT LONG TERM GOAL #4   Title understand ways to manage pain with home TENS unit, using device to massage muscles and meditation    Status Achieved      PT LONG TERM GOAL #5   Title LE strength including hip abductors, extensors, knee extensors grossly 4+/5 needed for lifting grocery bags, negotiating steps    Time 12    Period Weeks    Status New                   Plan - 03/02/21 1144     Clinical Impression Statement The patient called the clinic on a non-scheduled day to request to be seen for an exacerbation of back pain.  He presents with  forward flexed posture/ not standing fully erect.  He reports good relief with manual therapy including gentle neutral gapping, soft tissue mobilization and trigger point release of quadratus lumborum muscle.  Following treatment session he continues to move in a guarded manner initially but much more erect posture noted.    Comorbidities metastatic lung cancer, Lt hip replacement, ORIF on the Rt; on prednisone (daily)    Examination-Activity Limitations Dressing;Stand;Lift;Reach Overhead;Locomotion Level    Examination-Participation Restrictions Community Activity;Occupation    Rehab Potential Good    PT Frequency Biweekly    PT Duration 12 weeks    PT Treatment/Interventions Electrical Stimulation;Cryotherapy;Iontophoresis  4mg/ml Dexamethasone;Moist Heat;Therapeutic activities;Therapeutic exercise;Neuromuscular re-education;Manual techniques;Patient/family education;Dry needling;Spinal Manipulations;Taping;Aquatic Therapy    PT Next Visit Plan biweekly aquatic 4 more sessions then follow up in clinic;  KX modifiers    PT Home Exercise Plan Access Code: [email or web address]  previous code no new ex's given today             Patient will benefit from skilled therapeutic intervention in order to improve the following deficits and impairments:  Decreased range of motion, Increased fascial restricitons, Pain, Decreased mobility, Decreased strength, Hypomobility  Visit Diagnosis: Chronic bilateral low back pain without sciatica  Pain in right hip  Cramp and spasm     Problem List Patient Active Problem List   Diagnosis Date Noted   Pancolitis ([facility]) 03/29/2017   C. difficile colitis 03/29/2017   H/O: lung cancer 03/29/2017   ICH (intracerebral hemorrhage) ([facility]) 07/14/2016   Portal vein thrombosis 07/14/2016   Short-term memory loss 07/14/2016   Brain metastases ([facility]) 07/14/2016   Metastatic lung cancer (metastasis from lung to other site) ([facility]) 07/13/2016   Brain metastasis ([facility]) 07/13/2016    , PT 03/02/21 4:25 PM Phone: [phone] Fax: [phone]  ,  C 03/02/2021, 4:23 PM  [facility] [address], [address] [place], [place], [address] Phone: [phone]   Fax:  [phone]  Name: Jeffery Cobb MRN: [id number] Date of Birth: 10/28/1955    

## 2021-03-07 ENCOUNTER — Ambulatory Visit: Payer: Medicare Other | Admitting: Physical Therapy

## 2021-03-07 ENCOUNTER — Other Ambulatory Visit: Payer: Self-pay

## 2021-03-07 DIAGNOSIS — M6281 Muscle weakness (generalized): Secondary | ICD-10-CM

## 2021-03-07 DIAGNOSIS — M545 Low back pain, unspecified: Secondary | ICD-10-CM

## 2021-03-07 DIAGNOSIS — G8929 Other chronic pain: Secondary | ICD-10-CM

## 2021-03-07 DIAGNOSIS — R252 Cramp and spasm: Secondary | ICD-10-CM

## 2021-03-07 DIAGNOSIS — M25551 Pain in right hip: Secondary | ICD-10-CM

## 2021-03-07 NOTE — Therapy (Signed)
[facility] [address], [place], [place], [address] Phone: [phone]   Fax:  [phone]  Physical Therapy Treatment/Recertification   Patient Details  Name: Jeffery Cobb MRN: [id number] Date of Birth: 15-Dec-1955 Referring Provider (PT): Jeffery Malkin PA   Encounter Date: 03/07/2021   PT End of Session - 03/07/21 1109     Visit Number 18    Date for PT Re-Evaluation 05/18/21    Authorization Type Medicare    PT Start Time 1022    PT Stop Time 1100   DN   PT Time Calculation (min) 38 min    Activity Tolerance Patient tolerated treatment well             Past Medical History:  Diagnosis Date   Cancer ([place])    lung   Depression     Past Surgical History:  Procedure Laterality Date   JOINT REPLACEMENT Left 6.20/2018   hip    There were no vitals filed for this visit.   Subjective Assessment - 03/07/21 1022     Subjective I decided to take the week off from pool ex.  I think I'd like to try the dry needling.  I've had it twice before one time it was good, the 2nd time was terrible.    Pertinent History on prednisone ("[facility] said recent bone density was fine" ;  stage 4 metastatic lung cancer, adrenal gland damaged;  Lt THA (posterior approach), QUAD damage related to turmor;  ORIF Rt hip;  GOES BY [place]    Diagnostic tests L2-3 herniated discs; mild OA right hip    Patient Stated Goals live without pain; do everyday activities;  I'm just trying to survive    Currently in Pain? Yes    Pain Score 2    I"m on pain meds and muscle relaxers before it was 6-8   Pain Location Back    Pain Orientation Right    Pain Descriptors / Indicators Dull;Aching    Pain Type Chronic pain                [facility] PT Assessment - 03/07/21 0001       Assessment   Medical Diagnosis lumbar disc disease with radiculopathy    Referring Provider (PT) Jeffery Malkin PA      Observation/Other Assessments   Focus on  Therapeutic Outcomes (FOTO)  54%      AROM   Lumbar Flexion 60    Lumbar Extension 20    Lumbar - Right Side Bend 30    Lumbar - Left Side Bend 30                           [facility] Adult PT Treatment/Exercise - 03/07/21 0001       Manual Therapy   Soft tissue mobilization In prone and  Lt sidelying  Rt lumbar,QL, glute medius, TFL and proximal gluteals. pillows between LE all the way to foot; gentle grade 1 neutral gapping and oscillations of lumbo-pelvic region3x30 sec              Trigger Point Dry Needling - 03/07/21 0001     Consent Given? Yes    Education Handout Provided Previously provided    Muscles Treated Back/Hip Lumbar multifidi   right side   Lumbar multifidi Response Palpable increased muscle length  PT Short Term Goals - 02/23/21 1740       PT SHORT TERM GOAL #1   Title independent with intial HEP to maintain mobility and promote pain relief    Status Achieved      PT SHORT TERM GOAL #2   Title report < or = to 4/10 lumbar pain with exercise    Status Achieved               PT Long Term Goals - 03/07/21 1116       PT LONG TERM GOAL #1   Title independent with HEP and how to progress including aquatic program    Time 12    Period Weeks    Target Date 05/18/21      PT LONG TERM GOAL #2   Title The patient will report overall improvement in quality of life, function and pain control by 50%    Time 12    Period Weeks    Status On-going      PT LONG TERM GOAL #3   Title FOTO score improved from 47% to 62%    Time 12    Period Weeks    Status On-going      PT LONG TERM GOAL #4   Title understand ways to manage pain with home TENS unit, using device to massage muscles and meditation    Status Achieved      PT LONG TERM GOAL #5   Title LE strength including hip abductors, extensors, knee extensors grossly 4+/5 needed for lifting grocery bags, negotiating steps    Time 12    Period Weeks     Status On-going                   Plan - 03/07/21 1109     Clinical Impression Statement The patient had a sudden exacerbation of right lumbar pain (L5 region) 8 days ago for no apparent reason.  He called to be seen in the clinic last week and this week for pain relief.  Although he had a painful previous experience with DN, he requests to try this intervention today to help manage symptoms.  [place] technique used (gentlest approach) to right lumbar multifidi followed by additional manual therapy.  He tolerated the expected, usual discomfort fairly well with improved soft tissue mobility noted following.  Will do a recertification to increase treatment frequency to 2x/week for the short term to get a handle on this exacerbation.    Comorbidities metastatic lung cancer, Lt hip replacement, ORIF on the Rt; on prednisone (daily)    Examination-Activity Limitations Dressing;Stand;Lift;Reach Overhead;Locomotion Level    Examination-Participation Restrictions Community Activity;Occupation    Stability/Clinical Decision Making Evolving/Moderate complexity    Rehab Potential Good    PT Frequency 2x / week    PT Duration 12 weeks    PT Treatment/Interventions Electrical Stimulation;Cryotherapy;Iontophoresis 4mg /ml Dexamethasone;Moist Heat;Therapeutic activities;Therapeutic exercise;Neuromuscular re-education;Manual techniques;Patient/family education;Dry needling;Spinal Manipulations;Taping;Aquatic Therapy    PT Next Visit Plan assess response to DN to right lumbar multifidi;   return to aquatic PT; KX modifiers    PT Home Exercise Plan Access Code: [email or web address]  previous code no new ex's given today             Patient will benefit from skilled therapeutic intervention in order to improve the following deficits and impairments:  Decreased range of motion, Increased fascial restricitons, Pain, Decreased mobility, Decreased strength, Hypomobility  Visit Diagnosis: Chronic bilateral low  back pain without sciatica - Plan: PT  plan of care cert/re-cert  Pain in right hip - Plan: PT plan of care cert/re-cert  Cramp and spasm - Plan: PT plan of care cert/re-cert  Muscle weakness (generalized) - Plan: PT plan of care cert/re-cert     Problem List Patient Active Problem List   Diagnosis Date Noted   Pancolitis ([place]) 03/29/2017   C. difficile colitis 03/29/2017   H/O: lung cancer 03/29/2017   ICH (intracerebral hemorrhage) ([place]) 07/14/2016   Portal vein thrombosis 07/14/2016   Short-term memory loss 07/14/2016   Brain metastases ([place]) 07/14/2016   Metastatic lung cancer (metastasis from lung to other site) ([place]) 07/13/2016   Brain metastasis ([place]) 07/13/2016   Ruben Im, PT 03/07/21 11:21 AM Phone: [phone] Fax: [phone]  Alvera Singh 03/07/2021, 11:19 AM  [facility] [address] [place], [place], [address] Phone: [phone]   Fax:  [phone]  Name: Jeffery Cobb MRN: [id number] Date of Birth: 07/28/1956

## 2021-03-10 ENCOUNTER — Ambulatory Visit (HOSPITAL_BASED_OUTPATIENT_CLINIC_OR_DEPARTMENT_OTHER): Payer: PRIVATE HEALTH INSURANCE | Admitting: Physical Therapy

## 2021-03-15 ENCOUNTER — Ambulatory Visit: Payer: Medicare Other | Admitting: Physical Therapy

## 2021-03-15 ENCOUNTER — Other Ambulatory Visit: Payer: Self-pay

## 2021-03-15 ENCOUNTER — Encounter: Payer: Self-pay | Admitting: Physical Therapy

## 2021-03-15 DIAGNOSIS — R2689 Other abnormalities of gait and mobility: Secondary | ICD-10-CM

## 2021-03-15 DIAGNOSIS — R252 Cramp and spasm: Secondary | ICD-10-CM

## 2021-03-15 DIAGNOSIS — M545 Low back pain, unspecified: Secondary | ICD-10-CM | POA: Diagnosis not present

## 2021-03-15 DIAGNOSIS — M62830 Muscle spasm of back: Secondary | ICD-10-CM

## 2021-03-15 DIAGNOSIS — M6281 Muscle weakness (generalized): Secondary | ICD-10-CM

## 2021-03-15 DIAGNOSIS — M25551 Pain in right hip: Secondary | ICD-10-CM

## 2021-03-15 NOTE — Therapy (Signed)
[facility] [address], [place], [place], [address] Phone: [phone]   Fax:  [phone]  Physical Therapy Treatment  Patient Details  Name: Jeffery Cobb MRN: [id number] Date of Birth: 1956-03-06 Referring Provider (PT): Towanda Malkin PA   Encounter Date: 03/15/2021   PT End of Session - 03/15/21 0852     Visit Number 19    Date for PT Re-Evaluation 05/18/21    Authorization Type Medicare    PT Start Time 0849    PT Stop Time 0930    PT Time Calculation (min) 41 min    Activity Tolerance Patient tolerated treatment well    Behavior During Therapy [facility] for tasks assessed/performed             Past Medical History:  Diagnosis Date   Cancer ([place])    lung   Depression     Past Surgical History:  Procedure Laterality Date   JOINT REPLACEMENT Left 6.20/2018   hip    There were no vitals filed for this visit.   Subjective Assessment - 03/15/21 1027     Subjective Week off was good for my back, medication helpful. I swam on Monday, did my usual routine and did well. Today mild back pain.    Pertinent History on prednisone ("[facility] said recent bone density was fine" ;  stage 4 metastatic lung cancer, adrenal gland damaged;  Lt THA (posterior approach), QUAD damage related to turmor;  ORIF Rt hip;  GOES BY Jeffery Cobb    Diagnostic tests L2-3 herniated discs; mild OA right hip    Currently in Pain? Yes    Pain Score 2     Pain Location Back    Pain Orientation Right    Pain Descriptors / Indicators Dull;Aching    Aggravating Factors  Not sure    Pain Relieving Factors Being in the water    Multiple Pain Sites No            Treatment: Patient seen for aquatic therapy today.  Treatment took place in water 2.5-4 feet deep depending upon activity.  Pt entered the pool via stairs, water temp 92 degrees F  Mid chest depth for all standing exercises: Water walking all 4 direction across the pool 10x each with  concurrent discussion of status and pain. Abdominal/TA compressions with small noodle: 5 sec hold 10x, VC to keep ribs off his pelvis, then added tiny marching 20x.  Low back stretching at pool wall, neutral 3x 3 slow breaths, then with LT rotation 3x 3 slow breaths.                            PT Education - 03/15/21 1029     Education Details Doing some gentle ROm exercises in the bed prior to getting up in the morning    Person(s) Educated Patient    Comprehension Verbalized understanding              PT Short Term Goals - 02/23/21 1740       PT SHORT TERM GOAL #1   Title independent with intial HEP to maintain mobility and promote pain relief    Status Achieved      PT SHORT TERM GOAL #2   Title report < or = to 4/10 lumbar pain with exercise    Status Achieved               PT Long Term  Goals - 03/07/21 1116       PT LONG TERM GOAL #1   Title independent with HEP and how to progress including aquatic program    Time 12    Period Weeks    Target Date 05/18/21      PT LONG TERM GOAL #2   Title The patient will report overall improvement in quality of life, function and pain control by 50%    Time 12    Period Weeks    Status On-going      PT LONG TERM GOAL #3   Title FOTO score improved from 47% to 62%    Time 12    Period Weeks    Status On-going      PT LONG TERM GOAL #4   Title understand ways to manage pain with home TENS unit, using device to massage muscles and meditation    Status Achieved      PT LONG TERM GOAL #5   Title LE strength including hip abductors, extensors, knee extensors grossly 4+/5 needed for lifting grocery bags, negotiating steps    Time 12    Period Weeks    Status On-going                   Plan - 03/15/21 1030     Clinical Impression Statement Pt arrives to aquatic PT after a week of rest secondary to having a significant flare up of his back pain. pt presents with mild back this  morning. Pt has been able to return to his swimming routine 1x this week, today was first aquatic PT since flare. Pt tolerated all exercises well. in his return, he did report some tightening to his low back but some gentle stretching seemed to resolve this. No increased pain.    Personal Factors and Comorbidities Comorbidity 3+    Comorbidities metastatic lung cancer, Lt hip replacement, ORIF on the Rt; on prednisone (daily)    Examination-Activity Limitations Dressing;Stand;Lift;Reach Overhead;Locomotion Level    Examination-Participation Restrictions Community Activity;Occupation    Stability/Clinical Decision Making Evolving/Moderate complexity    Rehab Potential Good    PT Frequency 2x / week    PT Duration 12 weeks    PT Treatment/Interventions Electrical Stimulation;Cryotherapy;Iontophoresis 4mg /ml Dexamethasone;Moist Heat;Therapeutic activities;Therapeutic exercise;Neuromuscular re-education;Manual techniques;Patient/family education;Dry needling;Spinal Manipulations;Taping;Aquatic Therapy    PT Next Visit Plan 20th visit PN?    PT Home Exercise Plan Access Code: [email or web address]  previous code no new ex's given today    Consulted and Agree with Plan of Care Patient             Patient will benefit from skilled therapeutic intervention in order to improve the following deficits and impairments:  Decreased range of motion, Increased fascial restricitons, Pain, Decreased mobility, Decreased strength, Hypomobility  Visit Diagnosis: Chronic bilateral low back pain without sciatica  Pain in right hip  Cramp and spasm  Muscle weakness (generalized)  Muscle spasm of back  Other abnormalities of gait and mobility     Problem List Patient Active Problem List   Diagnosis Date Noted   Pancolitis ([place]) 03/29/2017   C. difficile colitis 03/29/2017   H/O: lung cancer 03/29/2017   ICH (intracerebral hemorrhage) ([place]) 07/14/2016   Portal vein thrombosis 07/14/2016   Short-term memory  loss 07/14/2016   Brain metastases ([place]) 07/14/2016   Metastatic lung cancer (metastasis from lung to other site) ([place]) 07/13/2016   Brain metastasis ([place]) 07/13/2016    Renny Gunnarson, PTA 03/15/2021, 1:03 PM  [facility] [address] [place], [place], [address] Phone: [phone]   Fax:  [phone]  Name: Jeffery Cobb MRN: [id number] Date of Birth: 05-08-1956

## 2021-03-22 ENCOUNTER — Other Ambulatory Visit: Payer: Self-pay

## 2021-03-22 ENCOUNTER — Ambulatory Visit: Payer: Medicare Other | Attending: Physician Assistant | Admitting: Physical Therapy

## 2021-03-22 ENCOUNTER — Encounter: Payer: Self-pay | Admitting: Physical Therapy

## 2021-03-22 DIAGNOSIS — R2689 Other abnormalities of gait and mobility: Secondary | ICD-10-CM | POA: Diagnosis present

## 2021-03-22 DIAGNOSIS — M62830 Muscle spasm of back: Secondary | ICD-10-CM | POA: Diagnosis present

## 2021-03-22 DIAGNOSIS — M545 Low back pain, unspecified: Secondary | ICD-10-CM | POA: Insufficient documentation

## 2021-03-22 DIAGNOSIS — M25551 Pain in right hip: Secondary | ICD-10-CM | POA: Diagnosis present

## 2021-03-22 DIAGNOSIS — M6281 Muscle weakness (generalized): Secondary | ICD-10-CM | POA: Diagnosis present

## 2021-03-22 DIAGNOSIS — G8929 Other chronic pain: Secondary | ICD-10-CM | POA: Diagnosis present

## 2021-03-22 DIAGNOSIS — R252 Cramp and spasm: Secondary | ICD-10-CM | POA: Insufficient documentation

## 2021-03-22 NOTE — Therapy (Signed)
[facility] [address], [place], [place], [address] Phone: [phone]   Fax:  [phone]  Physical Therapy Treatment  Patient Details  Name: Jeffery Cobb MRN: [id number] Date of Birth: 03-23-1956 Referring Provider (PT): Towanda Malkin PA   Encounter Date: 03/22/2021   PT End of Session - 03/22/21 0805     Visit Number 20    Date for PT Re-Evaluation 05/18/21    Authorization Type Medicare    PT Start Time 0800    PT Stop Time 0845    PT Time Calculation (min) 45 min    Activity Tolerance Patient tolerated treatment well    Behavior During Therapy [facility] for tasks assessed/performed             Past Medical History:  Diagnosis Date   Cancer ([place])    lung   Depression     Past Surgical History:  Procedure Laterality Date   JOINT REPLACEMENT Left 6.20/2018   hip    There were no vitals filed for this visit.   Subjective Assessment - 03/22/21 1258     Subjective No flare ups, wake up with "twinges." Otherwise doing ok    Pertinent History on prednisone ("[facility] said recent bone density was fine" ;  stage 4 metastatic lung cancer, adrenal gland damaged;  Lt THA (posterior approach), QUAD damage related to turmor;  ORIF Rt hip;  GOES BY Siddharth    Limitations Walking;House hold activities    How long can you sit comfortably? as long as I want    How long can you stand comfortably? undefined    How long can you walk comfortably? 20 mins    Patient Stated Goals live without pain; do everyday activities;  I'm just trying to survive    Currently in Pain? No/denies    Multiple Pain Sites No            Treatment: Patient seen for aquatic therapy today.  Treatment took place in water 2.5-4 feet deep depending upon activity.  Pt entered the pool via stairs, water temp 92 degrees.   Water walking using the kickboard for resistance/drag 10x forward and back. Pt in upper waist depth. Side stepping with jumping  jack arms and push & pull 10x. Standing core/abdominal presses with noodle 10x 5 sec hold, added tiny steps 10x. VC for core contraction and softening the upper traps. Yellow UE weights for shoulder horizontal add/abd 2 min. Underwater bicycle with yellow noodle x 6 min.   Pt utilizes viscosity of the water required for strengthening. Pt requires buoyancy of water for support and to offload joints with strengthening exercises.                              PT Short Term Goals - 02/23/21 1740       PT SHORT TERM GOAL #1   Title independent with intial HEP to maintain mobility and promote pain relief    Status Achieved      PT SHORT TERM GOAL #2   Title report < or = to 4/10 lumbar pain with exercise    Status Achieved               PT Long Term Goals - 03/07/21 1116       PT LONG TERM GOAL #1   Title independent with HEP and how to progress including aquatic program    Time 12  Period Weeks    Target Date 05/18/21      PT LONG TERM GOAL #2   Title The patient will report overall improvement in quality of life, function and pain control by 50%    Time 12    Period Weeks    Status On-going      PT LONG TERM GOAL #3   Title FOTO score improved from 47% to 62%    Time 12    Period Weeks    Status On-going      PT LONG TERM GOAL #4   Title understand ways to manage pain with home TENS unit, using device to massage muscles and meditation    Status Achieved      PT LONG TERM GOAL #5   Title LE strength including hip abductors, extensors, knee extensors grossly 4+/5 needed for lifting grocery bags, negotiating steps    Time 12    Period Weeks    Status On-going                   Plan - 03/22/21 1301     Clinical Impression Statement Pt presents with no recent flare up of back pain. He reports experimenting with some prolonged stretching at home both first thing in the morning and during the day which seems to be beneficial. No pain  or tightness during todays treatment.    Personal Factors and Comorbidities Comorbidity 3+    Examination-Participation Restrictions Community Activity;Occupation    Stability/Clinical Decision Making Evolving/Moderate complexity    Rehab Potential Good    PT Frequency 2x / week    PT Duration 12 weeks    PT Treatment/Interventions Electrical Stimulation;Cryotherapy;Iontophoresis 4mg /ml Dexamethasone;Moist Heat;Therapeutic activities;Therapeutic exercise;Neuromuscular re-education;Manual techniques;Patient/family education;Dry needling;Spinal Manipulations;Taping;Aquatic Therapy    PT Next Visit Plan Aquatics    PT Home Exercise Plan Access Code: [email or web address]  previous code no new ex's given today    Consulted and Agree with Plan of Care Patient             Patient will benefit from skilled therapeutic intervention in order to improve the following deficits and impairments:  Decreased range of motion, Increased fascial restricitons, Pain, Decreased mobility, Decreased strength, Hypomobility  Visit Diagnosis: Chronic bilateral low back pain without sciatica  Pain in right hip  Cramp and spasm  Muscle weakness (generalized)  Muscle spasm of back  Other abnormalities of gait and mobility     Problem List Patient Active Problem List   Diagnosis Date Noted   Pancolitis ([place]) 03/29/2017   C. difficile colitis 03/29/2017   H/O: lung cancer 03/29/2017   ICH (intracerebral hemorrhage) ([place]) 07/14/2016   Portal vein thrombosis 07/14/2016   Short-term memory loss 07/14/2016   Brain metastases ([place]) 07/14/2016   Metastatic lung cancer (metastasis from lung to other site) ([place]) 07/13/2016   Brain metastasis ([place]) 07/13/2016    Niajah Sipos, PTA 03/22/2021, 1:04 PM  [facility] [address] [place], [place], [address] Phone: [phone]   Fax:  [phone]  Name: Jeffery Cobb MRN: [id number] Date of Birth:  1955-12-15

## 2021-04-05 ENCOUNTER — Encounter: Payer: Self-pay | Admitting: Physical Therapy

## 2021-04-05 ENCOUNTER — Ambulatory Visit: Payer: Medicare Other | Admitting: Physical Therapy

## 2021-04-05 ENCOUNTER — Other Ambulatory Visit: Payer: Self-pay

## 2021-04-05 DIAGNOSIS — M6281 Muscle weakness (generalized): Secondary | ICD-10-CM

## 2021-04-05 DIAGNOSIS — R252 Cramp and spasm: Secondary | ICD-10-CM

## 2021-04-05 DIAGNOSIS — M25551 Pain in right hip: Secondary | ICD-10-CM

## 2021-04-05 DIAGNOSIS — R2689 Other abnormalities of gait and mobility: Secondary | ICD-10-CM

## 2021-04-05 DIAGNOSIS — M62830 Muscle spasm of back: Secondary | ICD-10-CM

## 2021-04-05 DIAGNOSIS — G8929 Other chronic pain: Secondary | ICD-10-CM

## 2021-04-05 DIAGNOSIS — M545 Low back pain, unspecified: Secondary | ICD-10-CM | POA: Diagnosis not present

## 2021-04-05 NOTE — Therapy (Signed)
[facility] [address] [place], [place], [address] Phone: [phone]   Fax:  [phone]  Physical Therapy Treatment  Patient Details  Name: Jeffery Cobb MRN: [id number] Date of Birth: 08/18/1956 Referring Provider (PT): Towanda Malkin PA   Encounter Date: 04/05/2021   PT End of Session - 04/05/21 0806     Visit Number 21    Date for PT Re-Evaluation 05/18/21    Authorization Type Medicare    PT Start Time 0802    PT Stop Time 0846    PT Time Calculation (min) 44 min    Activity Tolerance Patient tolerated treatment well    Behavior During Therapy [facility] for tasks assessed/performed             Past Medical History:  Diagnosis Date   Cancer ([place])    lung   Depression     Past Surgical History:  Procedure Laterality Date   JOINT REPLACEMENT Left 6.20/2018   hip    There were no vitals filed for this visit.   Subjective Assessment - 04/05/21 0805     Subjective Doing ok, a constant RT low back pain/ache but manageable.    Pertinent History on prednisone ("[facility] said recent bone density was fine" ;  stage 4 metastatic lung cancer, adrenal gland damaged;  Lt THA (posterior approach), QUAD damage related to turmor;  ORIF Rt hip;  [place]    Currently in Pain? Yes    Pain Score 2     Pain Location Back    Pain Orientation Right;Lower            Treatment: Patient seen for aquatic therapy today.  Treatment took place in water 2.5-4 feet deep depending upon activity.  Pt entered the pool via stairs, reciprocally. Water temp 92 degrees F.  Water walking for warm up 4-6x in all directions in mid throacic depth. Decompression float x 5 min with horizontal flotation, PTA providing gentle traction to BilLE  Stretching in same depth: gastroc , lumbar, lumbar with side bend, standing hip flexor with sidebend, quads: all BIl 2-3 sets 3 breaths  Abdominal compressions with blue noodle 10x, 5 sec  hold                             PT Short Term Goals - 02/23/21 1740       PT SHORT TERM GOAL #1   Title independent with intial HEP to maintain mobility and promote pain relief    Status Achieved      PT SHORT TERM GOAL #2   Title report < or = to 4/10 lumbar pain with exercise    Status Achieved               PT Long Term Goals - 03/07/21 1116       PT LONG TERM GOAL #1   Title independent with HEP and how to progress including aquatic program    Time 12    Period Weeks    Target Date 05/18/21      PT LONG TERM GOAL #2   Title The patient will report overall improvement in quality of life, function and pain control by 50%    Time 12    Period Weeks    Status On-going      PT LONG TERM GOAL #3   Title FOTO score improved from 47% to 62%    Time  12    Period Weeks    Status On-going      PT LONG TERM GOAL #4   Title understand ways to manage pain with home TENS unit, using device to massage muscles and meditation    Status Achieved      PT LONG TERM GOAL #5   Title LE strength including hip abductors, extensors, knee extensors grossly 4+/5 needed for lifting grocery bags, negotiating steps    Time 12    Period Weeks    Status On-going                   Plan - 04/05/21 0901     Clinical Impression Statement Pt arrives for aqautic PT with mild RT low back pain. Pt reports he has done well with prevention of back flare, has resumed all previous activities 100% without modification. He plans on swimming laps after his PT. PT focused on gentle submersion of 75% at least back stretches as well as decompression flotations.    Personal Factors and Comorbidities Comorbidity 3+    Comorbidities metastatic lung cancer, Lt hip replacement, ORIF on the Rt; on prednisone (daily)    Examination-Activity Limitations Dressing;Stand;Lift;Reach Overhead;Locomotion Level    Examination-Participation Restrictions Community Activity;Occupation     Stability/Clinical Decision Making Evolving/Moderate complexity    Rehab Potential Good    PT Frequency 2x / week    PT Duration 12 weeks    PT Treatment/Interventions Electrical Stimulation;Cryotherapy;Iontophoresis 4mg /ml Dexamethasone;Moist Heat;Therapeutic activities;Therapeutic exercise;Neuromuscular re-education;Manual techniques;Patient/family education;Dry needling;Spinal Manipulations;Taping;Aquatic Therapy    PT Next Visit Plan Aquatics    PT Home Exercise Plan Access Code: [email or web address]  previous code no new ex's given today    Consulted and Agree with Plan of Care Patient             Patient will benefit from skilled therapeutic intervention in order to improve the following deficits and impairments:  Decreased range of motion, Increased fascial restricitons, Pain, Decreased mobility, Decreased strength, Hypomobility  Visit Diagnosis: Chronic bilateral low back pain without sciatica  Pain in right hip  Cramp and spasm  Muscle weakness (generalized)  Muscle spasm of back  Other abnormalities of gait and mobility     Problem List Patient Active Problem List   Diagnosis Date Noted   Pancolitis ([place]) 03/29/2017   C. difficile colitis 03/29/2017   H/O: lung cancer 03/29/2017   ICH (intracerebral hemorrhage) ([place]) 07/14/2016   Portal vein thrombosis 07/14/2016   Short-term memory loss 07/14/2016   Brain metastases ([place]) 07/14/2016   Metastatic lung cancer (metastasis from lung to other site) ([place]) 07/13/2016   Brain metastasis ([place]) 07/13/2016    Nealy Karapetian, PTA 04/05/2021, 12:20 PM  [facility] [address], [place], [place], [address] Phone: [phone]   Fax:  [phone]  Name: Cowan Pilar MRN: [id number] Date of Birth: [birth year]

## 2021-04-19 ENCOUNTER — Ambulatory Visit: Payer: PRIVATE HEALTH INSURANCE | Admitting: Physical Therapy

## 2021-04-21 ENCOUNTER — Ambulatory Visit: Payer: Medicare Other | Attending: Physician Assistant | Admitting: Physical Therapy

## 2021-04-21 ENCOUNTER — Other Ambulatory Visit: Payer: Self-pay

## 2021-04-21 ENCOUNTER — Encounter: Payer: Self-pay | Admitting: Physical Therapy

## 2021-04-21 DIAGNOSIS — M25551 Pain in right hip: Secondary | ICD-10-CM

## 2021-04-21 DIAGNOSIS — R2689 Other abnormalities of gait and mobility: Secondary | ICD-10-CM

## 2021-04-21 DIAGNOSIS — M6281 Muscle weakness (generalized): Secondary | ICD-10-CM

## 2021-04-21 DIAGNOSIS — G8929 Other chronic pain: Secondary | ICD-10-CM | POA: Diagnosis present

## 2021-04-21 DIAGNOSIS — M62830 Muscle spasm of back: Secondary | ICD-10-CM | POA: Diagnosis present

## 2021-04-21 DIAGNOSIS — R252 Cramp and spasm: Secondary | ICD-10-CM

## 2021-04-21 DIAGNOSIS — M545 Low back pain, unspecified: Secondary | ICD-10-CM

## 2021-04-21 NOTE — Therapy (Signed)
[facility] [address] [place], [place], [address] Phone: [phone]   Fax:  [phone]  Physical Therapy Treatment  Patient Details  Name: Jeffery Cobb MRN: [id number] Date of Birth: Mar 04, 1956 Referring Provider (PT): Towanda Malkin PA   Encounter Date: 04/21/2021   PT End of Session - 04/21/21 1217     Visit Number 22    Date for PT Re-Evaluation 05/18/21    Authorization Type Medicare    PT Start Time 1216    PT Stop Time 1300    PT Time Calculation (min) 44 min    Activity Tolerance Patient tolerated treatment well    Behavior During Therapy [facility] for tasks assessed/performed             Past Medical History:  Diagnosis Date   Cancer ([place])    lung   Depression     Past Surgical History:  Procedure Laterality Date   JOINT REPLACEMENT Left 6.20/2018   hip    There were no vitals filed for this visit.   Subjective Assessment - 04/21/21 1218     Subjective Muscles are sore from yesterdays workout but no pain. Did have back pain last week from overdoing yardwork but now i am better.    Pertinent History on prednisone ("[facility] said recent bone density was fine" ;  stage 4 metastatic lung cancer, adrenal gland damaged;  Lt THA (posterior approach), QUAD damage related to turmor;  ORIF Rt hip;  [place]    Currently in Pain? Yes    Pain Score 2     Pain Location Back    Pain Orientation Right;Lower    Pain Descriptors / Indicators Dull    Aggravating Factors  Too random    Pain Relieving Factors being in the water    Multiple Pain Sites No            Treatment: Patient seen for aquatic therapy today.  Treatment took place in water 2.5-4 feet deep depending upon activity.  Pt entered the pool via stairs reciprocally, water temp 92 degrees F. Seated at water bench with 75% submersion; LE bicycle with lift off bench to increase challenge 2x 30 sec, VC to contract glutes more.   Standing in  chest depth: Pt requires buoyancy of water for support and to offload joints with strengthening, flexibility and ROM exercises.  Childs pose stretch with bil rotation 2x 30 sec each 2x through. Vc to breath into his ribs 360 degrees. Side stepping 4 lengths then second time through after short rest break. Requires smaller step when going to the LT.  Abdominal compressions with nekadoodle standing against the wall: 5 sec hold 10x Underwater seated decompression float, added LE support to decrease work at [employer] x5 min                             PT Short Term Goals - 02/23/21 1740       PT SHORT TERM GOAL #1   Title independent with intial HEP to maintain mobility and promote pain relief    Status Achieved      PT SHORT TERM GOAL #2   Title report < or = to 4/10 lumbar pain with exercise    Status Achieved               PT Long Term Goals - 03/07/21 1116       PT LONG TERM  GOAL #1   Title independent with HEP and how to progress including aquatic program    Time 12    Period Weeks    Target Date 05/18/21      PT LONG TERM GOAL #2   Title The patient will report overall improvement in quality of life, function and pain control by 50%    Time 12    Period Weeks    Status On-going      PT LONG TERM GOAL #3   Title FOTO score improved from 47% to 62%    Time 12    Period Weeks    Status On-going      PT LONG TERM GOAL #4   Title understand ways to manage pain with home TENS unit, using device to massage muscles and meditation    Status Achieved      PT LONG TERM GOAL #5   Title LE strength including hip abductors, extensors, knee extensors grossly 4+/5 needed for lifting grocery bags, negotiating steps    Time 12    Period Weeks    Status On-going                   Plan - 04/21/21 1219     Clinical Impression Statement Recent mild flare up of back pain but nothing near the flare prior. Pt recovered much quicker, muscle relaxors  helpful. Pt has continued with water exercise and beginner reformer Pilates in preparation for final independent exercisor plan. Pt required waters buoyancy to off load joints ( back) and lateral hip stability was main focus today in the water. Pt requires smaller steps to the LT in order to be controlled and not loose balance.    Personal Factors and Comorbidities Comorbidity 3+    Comorbidities metastatic lung cancer, Lt hip replacement, ORIF on the Rt; on prednisone (daily)    Examination-Activity Limitations Dressing;Stand;Lift;Reach Overhead;Locomotion Level    Examination-Participation Restrictions Community Activity;Occupation    Stability/Clinical Decision Making Evolving/Moderate complexity    Rehab Potential Good    PT Frequency 2x / week    PT Duration 12 weeks    PT Treatment/Interventions Electrical Stimulation;Cryotherapy;Iontophoresis 4mg /ml Dexamethasone;Moist Heat;Therapeutic activities;Therapeutic exercise;Neuromuscular re-education;Manual techniques;Patient/family education;Dry needling;Spinal Manipulations;Taping;Aquatic Therapy    PT Next Visit Plan Aquatics x2 more sessions then ERO/DC visit    PT Home Exercise Plan Access Code: [email or web address]  previous code no new ex's given today    Consulted and Agree with Plan of Care Patient             Patient will benefit from skilled therapeutic intervention in order to improve the following deficits and impairments:  Decreased range of motion, Increased fascial restricitons, Pain, Decreased mobility, Decreased strength, Hypomobility  Visit Diagnosis: Chronic bilateral low back pain without sciatica  Pain in right hip  Cramp and spasm  Muscle weakness (generalized)  Muscle spasm of back  Other abnormalities of gait and mobility     Problem List Patient Active Problem List   Diagnosis Date Noted   Pancolitis ([place]) 03/29/2017   C. difficile colitis 03/29/2017   H/O: lung cancer 03/29/2017   ICH (intracerebral  hemorrhage) ([place]) 07/14/2016   Portal vein thrombosis 07/14/2016   Short-term memory loss 07/14/2016   Brain metastases ([place]) 07/14/2016   Metastatic lung cancer (metastasis from lung to other site) ([place]) 07/13/2016   Brain metastasis ([place]) 07/13/2016    Romolo Sieling, PTA 04/21/2021, 1:10 PM  [facility] [address] [place],  [place], [address] Phone: [phone]   Fax:  [phone]  Name: Jeffery Cobb MRN: [id number] Date of Birth: 08-16-1956

## 2021-05-03 ENCOUNTER — Ambulatory Visit: Payer: Medicare Other | Admitting: Physical Therapy

## 2021-05-03 ENCOUNTER — Encounter: Payer: Self-pay | Admitting: Physical Therapy

## 2021-05-03 ENCOUNTER — Other Ambulatory Visit: Payer: Self-pay

## 2021-05-03 DIAGNOSIS — M6281 Muscle weakness (generalized): Secondary | ICD-10-CM

## 2021-05-03 DIAGNOSIS — M545 Low back pain, unspecified: Secondary | ICD-10-CM | POA: Diagnosis not present

## 2021-05-03 DIAGNOSIS — G8929 Other chronic pain: Secondary | ICD-10-CM

## 2021-05-03 DIAGNOSIS — R252 Cramp and spasm: Secondary | ICD-10-CM

## 2021-05-03 DIAGNOSIS — R2689 Other abnormalities of gait and mobility: Secondary | ICD-10-CM

## 2021-05-03 DIAGNOSIS — M25551 Pain in right hip: Secondary | ICD-10-CM

## 2021-05-03 DIAGNOSIS — M62830 Muscle spasm of back: Secondary | ICD-10-CM

## 2021-05-03 NOTE — Therapy (Signed)
[facility] [address], [place], [place], [address] Phone: [phone]   Fax:  [phone]  Physical Therapy Treatment  Patient Details  Name: Ekin Pilar MRN: [id number] Date of Birth: [birth year] Referring Provider (PT): Towanda Malkin PA   Encounter Date: 05/03/2021   PT End of Session - 05/03/21 0803     Visit Number 23    Date for PT Re-Evaluation 05/18/21    Authorization Type Medicare    PT Start Time 0803    PT Stop Time 0851    PT Time Calculation (min) 48 min    Activity Tolerance Patient tolerated treatment well    Behavior During Therapy [facility] for tasks assessed/performed             Past Medical History:  Diagnosis Date   Cancer ([place])    lung   Depression     Past Surgical History:  Procedure Laterality Date   JOINT REPLACEMENT Left 6.20/2018   hip    There were no vitals filed for this visit.   Subjective Assessment - 05/03/21 0852     Subjective I have those intense twinges pretty much every morning. I am hoping the shot will knock it out. Currently a pain in between my shoulder blades.    Pertinent History on prednisone ("[facility] said recent bone density was fine" ;  stage 4 metastatic lung cancer, adrenal gland damaged;  Lt THA (posterior approach), QUAD damage related to turmor;  ORIF Rt hip;  [place]    Currently in Pain? Yes    Pain Score 4     Pain Location Scapula    Pain Orientation Mid    Pain Descriptors / Indicators Sore    Aggravating Factors  Typically upon waking in the AM    Pain Relieving Factors exercising especially in the water, being mindful of my posture    Multiple Pain Sites No            Treatment; Patient seen for aquatic therapy today.  Treatment took place in water 2.5-4 feet deep depending upon activity.  Pt entered the pool via stairs, reciprocally with minor use of rails. Water temp 93 degrees F. Pt requires buoyancy of water for support and to  offload joints with strengthening exercises.    Seated water bench with 75% submersion Pt performed seated LE AROM exercises 20x in all planes, added 2# ankle weight for LT LAQ 2x 15 Scap squeezes with emphasis on postural correction  Standing 75% submersion; Water walking 10x each direction, tandem stance Bil with large UE weights for shoulder horizontal abd/add 2x10, LTLE single limb stance with current provided by PTA to challenge balance and stability. 15 sec 4x. Wall stretches for lumbar, glutes, gastroc Bil 30x each. Second step hamstring stretch with ankle DF/PF 10x each.                              PT Short Term Goals - 02/23/21 1740       PT SHORT TERM GOAL #1   Title independent with intial HEP to maintain mobility and promote pain relief    Status Achieved      PT SHORT TERM GOAL #2   Title report < or = to 4/10 lumbar pain with exercise    Status Achieved               PT Long Term Goals - 03/07/21 1116  PT LONG TERM GOAL #1   Title independent with HEP and how to progress including aquatic program    Time 12    Period Weeks    Target Date 05/18/21      PT LONG TERM GOAL #2   Title The patient will report overall improvement in quality of life, function and pain control by 50%    Time 12    Period Weeks    Status On-going      PT LONG TERM GOAL #3   Title FOTO score improved from 47% to 62%    Time 12    Period Weeks    Status On-going      PT LONG TERM GOAL #4   Title understand ways to manage pain with home TENS unit, using device to massage muscles and meditation    Status Achieved      PT LONG TERM GOAL #5   Title LE strength including hip abductors, extensors, knee extensors grossly 4+/5 needed for lifting grocery bags, negotiating steps    Time 12    Period Weeks    Status On-going                   Plan - 05/03/21 0857     Clinical Impression Statement Pt arrives to aquatic PT with complaints of  mid back, interscapula pain. He reports consistent AM "twinges" in his back, can be intense at times that he hopes will be positively effected by his upcoming shot. Aquatic focus was on LT hip stability and quad strength. Pt has difficulty in single limb stance (LT) with perturbations ( waves) provided by PTA. No increased pain, Mid scapula pain seemed to abolish with postural corrections.    Personal Factors and Comorbidities Comorbidity 3+    Examination-Activity Limitations Dressing;Stand;Lift;Reach Overhead;Locomotion Level    Examination-Participation Restrictions Community Activity;Occupation    Stability/Clinical Decision Making Evolving/Moderate complexity    Rehab Potential Good    PT Frequency 2x / week    PT Duration 12 weeks    PT Treatment/Interventions Electrical Stimulation;Cryotherapy;Iontophoresis 4mg /ml Dexamethasone;Moist Heat;Therapeutic activities;Therapeutic exercise;Neuromuscular re-education;Manual techniques;Patient/family education;Dry needling;Spinal Manipulations;Taping;Aquatic Therapy    PT Next Visit Plan 1 more aquatic visit.    PT Home Exercise Plan Access Code: [email or web address]  previous code no new ex's given today    Consulted and Agree with Plan of Care Patient             Patient will benefit from skilled therapeutic intervention in order to improve the following deficits and impairments:     Visit Diagnosis: Chronic bilateral low back pain without sciatica  Pain in right hip  Cramp and spasm  Muscle spasm of back  Muscle weakness (generalized)  Other abnormalities of gait and mobility     Problem List Patient Active Problem List   Diagnosis Date Noted   Pancolitis ([place]) 03/29/2017   C. difficile colitis 03/29/2017   H/O: lung cancer 03/29/2017   ICH (intracerebral hemorrhage) ([place]) 07/14/2016   Portal vein thrombosis 07/14/2016   Short-term memory loss 07/14/2016   Brain metastases ([place]) 07/14/2016   Metastatic lung cancer (metastasis from  lung to other site) ([place]) 07/13/2016   Brain metastasis ([place]) 07/13/2016    Guilford Shannahan, PTA 05/03/2021, 9:01 AM  [facility] [address], [place], [place], [address] Phone: [phone]   Fax:  [phone]  Name: Raekwon Winkowski MRN: [id number] Date of Birth: [birth year]

## 2021-05-17 ENCOUNTER — Ambulatory Visit: Payer: Medicare Other | Admitting: Physical Therapy

## 2021-05-17 ENCOUNTER — Encounter: Payer: Self-pay | Admitting: Physical Therapy

## 2021-05-17 ENCOUNTER — Other Ambulatory Visit: Payer: Self-pay

## 2021-05-17 DIAGNOSIS — M545 Low back pain, unspecified: Secondary | ICD-10-CM

## 2021-05-17 DIAGNOSIS — M6281 Muscle weakness (generalized): Secondary | ICD-10-CM

## 2021-05-17 DIAGNOSIS — R252 Cramp and spasm: Secondary | ICD-10-CM

## 2021-05-17 DIAGNOSIS — R2689 Other abnormalities of gait and mobility: Secondary | ICD-10-CM

## 2021-05-17 DIAGNOSIS — M62830 Muscle spasm of back: Secondary | ICD-10-CM

## 2021-05-17 DIAGNOSIS — M25551 Pain in right hip: Secondary | ICD-10-CM

## 2021-05-17 DIAGNOSIS — G8929 Other chronic pain: Secondary | ICD-10-CM

## 2021-05-17 NOTE — Therapy (Signed)
[facility] [address], [place], [place], [address] Phone: [phone]   Fax:  [phone]  Physical Therapy Treatment  Patient Details  Name: Jeffery Cobb MRN: [id number] Date of Birth: 03-17-1956 Referring Provider (PT): Towanda Malkin PA   Encounter Date: 05/17/2021   PT End of Session - 05/17/21 0802     Visit Number 24    Date for PT Re-Evaluation 05/18/21    Authorization Type Medicare    PT Start Time 0800    PT Stop Time 0848    PT Time Calculation (min) 48 min    Activity Tolerance Patient tolerated treatment well    Behavior During Therapy [facility] for tasks assessed/performed             Past Medical History:  Diagnosis Date   Cancer ([place])    lung   Depression     Past Surgical History:  Procedure Laterality Date   JOINT REPLACEMENT Left 6.20/2018   hip    There were no vitals filed for this visit.   Subjective Assessment - 05/17/21 0850     Subjective I am in a very good place. Having back injection 10/14 after my re-evaluation. pt feels good about his HEP/plan moving forward.    Pertinent History on prednisone ("[facility] said recent bone density was fine" ;  stage 4 metastatic lung cancer, adrenal gland damaged;  Lt THA (posterior approach), QUAD damage related to turmor;  ORIF Rt hip;  [place]    Currently in Pain? --   A 'little something between my shouder baldes this AM." 1-2/10 but I know what to do posturally to work on it.            Treatment: Patient seen for aquatic therapy today.  Treatment took place in water 2.5-4 feet deep depending upon activity.  Pt entered the pool via stairs,reciprocally with minor use of rails. Pt requires buoyancy of water for support and to offload joints with strengthening exercises.  Water temp 95 degrees F. Review of current status. Pt would like to have his final re-evaluation on 10/14.  Standing in 75% depth: water walking 10x in all  directions: VC to increase speed to increase current ( resistance). Standing core work using the Haematologist, Warrior 2 for LE while pt performs horizontal ad/add with yellow UE weights 2 min, stretches inc: Bil quads, addutors, hamstrings, lats, gastroc with ankle DF/PF 30 sec holds 2x on each stretch.   Pt was able to practice a few underwater strokes painfree.                              PT Short Term Goals - 02/23/21 1740       PT SHORT TERM GOAL #1   Title independent with intial HEP to maintain mobility and promote pain relief    Status Achieved      PT SHORT TERM GOAL #2   Title report < or = to 4/10 lumbar pain with exercise    Status Achieved               PT Long Term Goals - 03/07/21 1116       PT LONG TERM GOAL #1   Title independent with HEP and how to progress including aquatic program    Time 12    Period Weeks    Target Date 05/18/21      PT LONG  TERM GOAL #2   Title The patient will report overall improvement in quality of life, function and pain control by 50%    Time 12    Period Weeks    Status On-going      PT LONG TERM GOAL #3   Title FOTO score improved from 47% to 62%    Time 12    Period Weeks    Status On-going      PT LONG TERM GOAL #4   Title understand ways to manage pain with home TENS unit, using device to massage muscles and meditation    Status Achieved      PT LONG TERM GOAL #5   Title LE strength including hip abductors, extensors, knee extensors grossly 4+/5 needed for lifting grocery bags, negotiating steps    Time 12    Period Weeks    Status On-going                   Plan - 05/17/21 7017     Clinical Impression Statement Pt arrives for final aquatic PT session. pt is independent in fianl aquatic HEP which includes his lap swimming, stretching, "PT exercises"( hip strength & stabilization). Pt also performing daily stetches and meditations at home and Pilates 1x week. Pt is  scheduled for back injection on 10/14 in hopes of abolishing the intermittent AM twinges he feels especially first thing out of bed. Pt also verbally understand how to use the water principles to adapt his workouts to what his body needs on a particular day.    Personal Factors and Comorbidities Comorbidity 3+    Comorbidities metastatic lung cancer, Lt hip replacement, ORIF on the Rt; on prednisone (daily)    Examination-Activity Limitations Dressing;Stand;Lift;Reach Overhead;Locomotion Level    Examination-Participation Restrictions Community Activity;Occupation    Stability/Clinical Decision Making Evolving/Moderate complexity    Rehab Potential Good    PT Frequency 2x / week    PT Duration 12 weeks    PT Treatment/Interventions Electrical Stimulation;Cryotherapy;Iontophoresis 4mg /ml Dexamethasone;Moist Heat;Therapeutic activities;Therapeutic exercise;Neuromuscular re-education;Manual techniques;Patient/family education;Dry needling;Spinal Manipulations;Taping;Aquatic Therapy    PT Next Visit Plan DC next visit: It looks like POC ends before this visit which may have been a scheduling error? May have to renew for 1x visit and DC?? Pt is prepared for DC.    PT Home Exercise Plan Access Code: [email or web address]  previous code no new ex's given today    Consulted and Agree with Plan of Care Patient             Patient will benefit from skilled therapeutic intervention in order to improve the following deficits and impairments:  Decreased range of motion, Increased fascial restricitons, Pain, Decreased mobility, Decreased strength, Hypomobility  Visit Diagnosis: Chronic bilateral low back pain without sciatica  Pain in right hip  Cramp and spasm  Muscle spasm of back  Muscle weakness (generalized)  Other abnormalities of gait and mobility     Problem List Patient Active Problem List   Diagnosis Date Noted   Pancolitis ([place]) 03/29/2017   C. difficile colitis 03/29/2017   H/O: lung  cancer 03/29/2017   ICH (intracerebral hemorrhage) ([place]) 07/14/2016   Portal vein thrombosis 07/14/2016   Short-term memory loss 07/14/2016   Brain metastases ([place]) 07/14/2016   Metastatic lung cancer (metastasis from lung to other site) ([place]) 07/13/2016   Brain metastasis ([place]) 07/13/2016    Andry Bogden, PTA 05/17/2021, 8:59 AM  [facility] [address], [place], [place],  [address] Phone: [phone]   Fax:  [phone]  Name: Boruch Manuele MRN: [id number] Date of Birth: 1956-04-03

## 2021-05-26 ENCOUNTER — Other Ambulatory Visit: Payer: Self-pay

## 2021-05-26 ENCOUNTER — Ambulatory Visit: Payer: Medicare Other | Attending: Physician Assistant | Admitting: Physical Therapy

## 2021-05-26 DIAGNOSIS — G8929 Other chronic pain: Secondary | ICD-10-CM | POA: Insufficient documentation

## 2021-05-26 DIAGNOSIS — M545 Low back pain, unspecified: Secondary | ICD-10-CM | POA: Diagnosis present

## 2021-05-26 DIAGNOSIS — M25551 Pain in right hip: Secondary | ICD-10-CM | POA: Insufficient documentation

## 2021-05-26 DIAGNOSIS — R252 Cramp and spasm: Secondary | ICD-10-CM | POA: Insufficient documentation

## 2021-05-26 NOTE — Therapy (Signed)
[facility] [place], [place], [address] Phone: [phone]   Fax:  [phone]  Physical Therapy Treatment/Recertifcation/Discharge summary   Patient Details  Name: Jeffery Cobb MRN: [id number] Date of Birth: 1956-08-10 Referring Provider (PT): Towanda Malkin PA   Encounter Date: 05/26/2021   PT End of Session - 05/26/21 0938     Visit Number 25    Date for PT Re-Evaluation 05/26/21    Authorization Type Medicare    PT Start Time 6962    PT Stop Time 0925   discharge   PT Time Calculation (min) 35 min             Past Medical History:  Diagnosis Date   Cancer ([place])    lung   Depression     Past Surgical History:  Procedure Laterality Date   JOINT REPLACEMENT Left 6.20/2018   hip    There were no vitals filed for this visit.   Subjective Assessment - 05/26/21 0855     Subjective I've embraced therapy this time.  I've gotten into Pilates the last 2 months and that's been a Higher education careers adviser.  I feel like I'm in the best shape of my life.  I am getting an injection next Friday.  Still some spasms.    Pertinent History on prednisone ("[facility] said recent bone density was fine" ;  stage 4 metastatic lung cancer, adrenal gland damaged;  Lt THA (posterior approach), QUAD damage related to turmor;  ORIF Rt hip;  [place]    Currently in Pain? No/denies                [facility] PT Assessment - 05/26/21 0001       Observation/Other Assessments   Focus on Therapeutic Outcomes (FOTO)  54% previously but today 46%      Strength   Right Hip Flexion 5/5    Right Hip Extension 4/5    Right Hip ABduction 4/5    Right Hip ADduction 4+/5    Left Hip Flexion 5/5    Left Hip Extension 4/5    Left Hip External Rotation 4/5    Left Hip Internal Rotation 4/5    Left Hip ABduction 4/5    Left Hip ADduction 5/5                           [facility] Adult PT Treatment/Exercise - 05/26/21 0001        Self-Care   Other Self-Care Comments  self management      Lumbar Exercises: Seated   Other Seated Lumbar Exercises discussion of current ex program (swimming and Pilates)    Other Seated Lumbar Exercises discussion of functional improvements      Manual Therapy   Soft tissue mobilization prone right > left lumbosacral myofascial work                       PT Short Term Goals - 02/23/21 1740       PT SHORT TERM GOAL #1   Title independent with intial HEP to maintain mobility and promote pain relief    Status Achieved      PT SHORT TERM GOAL #2   Title report < or = to 4/10 lumbar pain with exercise    Status Achieved               PT Long Term Goals -  05/26/21 0903       PT LONG TERM GOAL #1   Title independent with HEP and how to progress including aquatic program    Status Achieved      PT LONG TERM GOAL #2   Title The patient will report overall improvement in quality of life, function and pain control by 50%    Baseline 60%    Status Achieved      PT LONG TERM GOAL #3   Title FOTO score improved from 47% to 62%    Status Partially Met      PT LONG TERM GOAL #4   Title understand ways to manage pain with home TENS unit, using device to massage muscles and meditation    Status Achieved      PT LONG TERM GOAL #5   Title LE strength including hip abductors, extensors, knee extensors grossly 4+/5 needed for lifting grocery bags, negotiating steps    Status Partially Met                   Plan - 05/26/21 0939     Clinical Impression Statement The patient reports an excellent response to aquatic PT and regular performance of Pilates.  He rates his overall improvement at 60% and feels confident in his abilities to self manage this chronic back pain issue at this time.  Improvements in strength noted in lumbo/pelvic/hip to at least 4/5.  Recommend discharge from PT at his time with the majority of goals met.    Comorbidities  metastatic lung cancer, Lt hip replacement, ORIF on the Rt; on prednisone (daily)    Examination-Activity Limitations Dressing;Stand;Lift;Reach Overhead;Locomotion Level    Rehab Potential Good    PT Frequency 2x / week    PT Duration 12 weeks    PT Treatment/Interventions Electrical Stimulation;Cryotherapy;Iontophoresis 21m/ml Dexamethasone;Moist Heat;Therapeutic activities;Therapeutic exercise;Neuromuscular re-education;Manual techniques;Patient/family education;Dry needling;Spinal Manipulations;Taping;Aquatic Therapy    PT Next Visit Plan Discharge visit    PT Home Exercise Plan Access Code: [email or web address]  previous code no new ex's given today             Patient will benefit from skilled therapeutic intervention in order to improve the following deficits and impairments:  Decreased range of motion, Increased fascial restricitons, Pain, Decreased mobility, Decreased strength, Hypomobility  Visit Diagnosis: Chronic bilateral low back pain without sciatica - Plan: PT plan of care cert/re-cert  Pain in right hip - Plan: PT plan of care cert/re-cert  Cramp and spasm - Plan: PT plan of care cert/re-cert   PHYSICAL THERAPY DISCHARGE SUMMARY  Visits from Start of Care: 25  Current functional level related to goals / functional outcomes: See clinical impressions above   Remaining deficits: As above   Education / Equipment: HEP/aquatic ex   Patient agrees to discharge. Patient goals were partially met. Patient is being discharged due to meeting the stated rehab goals.   Problem List Patient Active Problem List   Diagnosis Date Noted   [place] 03/29/2017   C. difficile colitis 03/29/2017   H/O: lung cancer 03/29/2017   ICH (intracerebral hemorrhage) ([place] 07/14/2016   Portal vein thrombosis 07/14/2016   Short-term memory loss 07/14/2016   Brain metastases ([place] 07/14/2016   Metastatic lung cancer (metastasis from lung to other site) ([facility] 07/13/2016   Brain metastasis  ([place] 07/13/2016   SRuben Im PT 05/26/21 9:48 AM Phone: [phone]: [phone] SAlvera Singh PT 05/26/2021, 9:47 AM  [facility]  [place], [place], [address] Phone: [phone]   Fax:  [phone]  Name: Jeffery Cobb MRN: [id number] Date of Birth: 02/05/1956

## 2021-06-02 ENCOUNTER — Ambulatory Visit: Payer: PRIVATE HEALTH INSURANCE | Admitting: Physical Therapy

## 2021-09-26 ENCOUNTER — Other Ambulatory Visit (HOSPITAL_COMMUNITY): Payer: Self-pay | Admitting: Family Medicine

## 2021-09-26 DIAGNOSIS — C3491 Malignant neoplasm of unspecified part of right bronchus or lung: Secondary | ICD-10-CM

## 2021-10-09 ENCOUNTER — Other Ambulatory Visit (HOSPITAL_COMMUNITY): Payer: Self-pay | Admitting: Radiology

## 2021-10-09 ENCOUNTER — Ambulatory Visit (HOSPITAL_COMMUNITY)
Admission: RE | Admit: 2021-10-09 | Discharge: 2021-10-09 | Disposition: A | Discharge status: Home / Self Care | Payer: Medicare Other | Source: Ambulatory Visit | Attending: Family Medicine | Admitting: Family Medicine

## 2021-10-09 ENCOUNTER — Other Ambulatory Visit: Payer: Self-pay

## 2021-10-09 DIAGNOSIS — C7931 Secondary malignant neoplasm of brain: Secondary | ICD-10-CM

## 2021-10-09 MED ORDER — GADOBUTROL 1 MMOL/ML IV SOLN
8.5000 mL | Freq: Once | INTRAVENOUS | Status: AC | PRN
  Administered 2021-10-09: 8.5 mL via INTRAVENOUS

## 2022-01-24 ENCOUNTER — Other Ambulatory Visit (HOSPITAL_BASED_OUTPATIENT_CLINIC_OR_DEPARTMENT_OTHER): Payer: Self-pay | Admitting: Family Medicine

## 2022-01-24 ENCOUNTER — Ambulatory Visit (HOSPITAL_BASED_OUTPATIENT_CLINIC_OR_DEPARTMENT_OTHER)
Admission: RE | Admit: 2022-01-24 | Discharge: 2022-01-24 | Disposition: A | Discharge status: Home / Self Care | Payer: Medicare Other | Source: Ambulatory Visit | Attending: Family Medicine | Admitting: Family Medicine

## 2022-01-24 DIAGNOSIS — R319 Hematuria, unspecified: Secondary | ICD-10-CM | POA: Diagnosis present

## 2022-01-24 MED ORDER — IOHEXOL 300 MG/ML  SOLN
125.0000 mL | Freq: Once | INTRAMUSCULAR | Status: AC | PRN
  Administered 2022-01-24: 125 mL via INTRAVENOUS

## 2022-07-23 NOTE — Progress Notes (Unsigned)
Cardiology Office Note:    Date:  07/24/2022   ID:  Jeffery Cobb, DOB [birth year], MRN [id number]  PCP:  Lujean Amel, MD  Cardiologist:  None   Referring MD: Lujean Amel, MD   No chief complaint on file.   History of Present Illness:    Jeffery Cobb is a 66 y.o. male with a hx of severe CAC by CT at [facility].  Other medial problems: bipolar disorder, right lung cancer hypopituitarism,   The patient has no cardiac symptoms.  He is here because of the severe coronary artery calcification reading from [facility] when he had a high-resolution chest CT performed.  He gets greater than 150 minutes of moderate activity per week in the form of walking and swimming.  No exertional related chest discomfort.  His primary physician after finding out about the coronary calcium started a low-dose statin.  His most recent lipid panel revealed an LDL of 147.  The low-dose statin that was started will probably not get the LDL to the target of 70.  But further adjustments can be made based upon the result in 6 weeks.  He has never had any issue with diabetes but needs to have a hemoglobin A1c performed.  Blood pressure target was also discussed.  His brother has an aortic aneurysm.  He has no history of hypertension.  Past Medical History:  Diagnosis Date   Anxiety    Bipolar affective disorder ([place])    CAD (coronary artery disease)    Cancer ([place])    lung   Depression    Depression    Hypercholesterolemia    Hypopituitarism ([place])    Hypothyroidism (acquired)    Malignant neoplasm of right lung, unspecified part of lung (HCC)     Past Surgical History:  Procedure Laterality Date   JOINT REPLACEMENT Left 6.20/2018   hip    Current Medications: Current Meds  Medication Sig   aspirin EC 81 MG tablet Take 1 tablet (81 mg total) by mouth daily. Swallow whole.   lurasidone (LATUDA) 40 MG TABS tablet Take 40 mg by mouth daily at 6 (six) AM.   predniSONE (DELTASONE)  5 MG tablet Take 5 mg by mouth daily at 6 (six) AM.   rosuvastatin (CRESTOR) 5 MG tablet Take 5 mg by mouth at bedtime.   testosterone cypionate (DEPOTESTOTERONE CYPIONATE) 100 MG/ML injection Inject 200 mg into the muscle every 14 (fourteen) days. For IM use only     Allergies:   Triamcinolone acetonide   Social History   Socioeconomic History   Marital status: Divorced    Spouse name: Not on file   Number of children: Not on file   Years of education: Not on file   Highest education level: Not on file  Occupational History   Not on file  Tobacco Use   Smoking status: Former    Types: Cigarettes    Quit date: 77    Years since quitting: 36.9   Smokeless tobacco: Current  Vaping Use   Vaping Use: Unknown  Substance and Sexual Activity   Alcohol use: No   Drug use: No   Sexual activity: Not on file  Other Topics Concern   Not on file  Social History Narrative   Not on file   Social Determinants of Health   Financial Resource Strain: Not on file  Food Insecurity: Not on file  Transportation Needs: Not on file  Physical Activity: Not on file  Stress: Not on file  Social Connections: Not on file     Family History: The patient's family history includes Breast cancer in his sister; Lung cancer in his mother.  ROS:   Please see the history of present illness.    Chronic long-term management of metastatic cancer.  All other systems reviewed and are negative.  EKGs/Labs/Other Studies Reviewed:    The following studies were reviewed today:  MRI BRAIN 06/22/2022: IMPRESSION:  1. Stable metastatic lesions within the bilateral frontal lobes, left  temporal lobe, and right superior cerebellum.  2.  Additional 4 mm lesion in the left occipital sulcus that may be a small  metastasis versus vascular lesion, but also remains stable from prior.  3.  No new metastases are identified.   CT CHEST [place] 06/14/2022: IMPRESSION:   1.  Unchanged consolidative opacity in the  right lower lobe with  involvement of the chest wall and adjacent rib, consistent with treated  malignancy. No new pulmonary opacities.  2.  Interval slight improvement in the bandlike opacity in the left lung  base, now with residual tree-in-bud nodularity. These findings are favored  to represent improving infection/inflammation. Continued attention on  follow-up is recommended.  3.  Severe coronary artery calcifications.   EKG:  EKG right atrial abnormality, interventricular conduction delay, nonspecific T wave flattening, and otherwise okay.  Prior tracing.  Recent Labs: No results found for requested labs within last 365 days.  Recent Lipid Panel No results found for: "CHOL", "TRIG", "HDL", "CHOLHDL", "VLDL", "LDLCALC", "LDLDIRECT"  Physical Exam:    VS:  BP 120/68   Pulse 74   Ht 5\' 10"  (1.778 m)   Wt 181 lb 9.6 oz (82.4 kg)   SpO2 98%   BMI 26.06 kg/m     Wt Readings from Last 3 Encounters:  07/24/22 181 lb 9.6 oz (82.4 kg)  03/30/17 184 lb 4.9 oz (83.6 kg)  07/14/16 192 lb 9.6 oz (87.4 kg)     GEN: Slightly overweight. No acute distress HEENT: Normal NECK: No JVD. LYMPHATICS: No lymphadenopathy CARDIAC: No murmur. RRR no gallop, or edema. VASCULAR:  Normal Pulses. No bruits. RESPIRATORY:  Clear to auscultation without rales, wheezing or rhonchi  ABDOMEN: Soft, non-tender, non-distended, No pulsatile mass, MUSCULOSKELETAL: No deformity  SKIN: Warm and dry NEUROLOGIC:  Alert and oriented x 3 PSYCHIATRIC:  Normal affect   ASSESSMENT:    1. Coronary artery disease involving native coronary artery of native heart without angina pectoris   2. H/O: lung cancer   3. Malignant neoplasm metastatic to brain [facility]    PLAN:    In order of problems listed above:  Secondary prevention discussed.  He should start an enteric-coated 81 mg aspirin tablet daily assuming this is safe with his brain lesions.  He is already started on statin.  5 mg of rosuvastatin is started.   Target LDL should be less than 70.  Overall education and awareness concerning primary/secondary risk prevention was discussed in detail: LDL less than 70, hemoglobin A1c less than 7, blood pressure target less than 130/80 mmHg, >150 minutes of moderate aerobic activity per week, avoidance of smoking, weight control (via diet and exercise), and continued surveillance/management of/for obstructive sleep apnea.  Follow-up if chest discomfort.  Medication Adjustments/Labs and Tests Ordered: Current medicines are reviewed at length with the patient today.  Concerns regarding medicines are outlined above.  Orders Placed This Encounter  Procedures   EKG 12-Lead   Meds ordered this encounter  Medications   aspirin EC 81 MG  tablet    Sig: Take 1 tablet (81 mg total) by mouth daily. Swallow whole.    Patient Instructions  Medication Instructions:  Your physician has recommended you make the following change in your medication:   1) START aspirin 81mg  daily (you can purchase this over the counter, look for enteric coated tablets, this helps to protect stomach lining)  *If you need a refill on your cardiac medications before your next appointment, please call your pharmacy*  Follow-Up: As needed  Important Information About Sugar         Signed, Sinclair Grooms, MD  07/24/2022 4:55 PM    [place]

## 2022-07-24 ENCOUNTER — Ambulatory Visit: Payer: Medicare Other | Attending: Interventional Cardiology | Admitting: Interventional Cardiology

## 2022-07-24 ENCOUNTER — Encounter: Payer: Self-pay | Admitting: Interventional Cardiology

## 2022-07-24 VITALS — BP 120/68 | HR 74 | Ht 70.0 in | Wt 181.6 lb

## 2022-07-24 DIAGNOSIS — Z85118 Personal history of other malignant neoplasm of bronchus and lung: Secondary | ICD-10-CM | POA: Diagnosis not present

## 2022-07-24 DIAGNOSIS — C7931 Secondary malignant neoplasm of brain: Secondary | ICD-10-CM | POA: Diagnosis not present

## 2022-07-24 DIAGNOSIS — I2510 Atherosclerotic heart disease of native coronary artery without angina pectoris: Secondary | ICD-10-CM | POA: Diagnosis not present

## 2022-07-24 MED ORDER — ASPIRIN 81 MG PO TBEC: 81.0000 mg | DELAYED_RELEASE_TABLET | Freq: Every day | ORAL | Status: DC

## 2022-07-24 NOTE — Patient Instructions (Addendum)
Medication Instructions:  Your physician has recommended you make the following change in your medication:   1) START aspirin 81mg  daily (you can purchase this over the counter, look for enteric coated tablets, this helps to protect stomach lining)  *If you need a refill on your cardiac medications before your next appointment, please call your pharmacy*  Follow-Up: As needed  Important Information About Sugar

## 2023-05-17 ENCOUNTER — Emergency Department (HOSPITAL_BASED_OUTPATIENT_CLINIC_OR_DEPARTMENT_OTHER)
Admission: EM | Admit: 2023-05-17 | Discharge: 2023-05-17 | Disposition: A | Discharge status: Home / Self Care | Payer: Medicare Other | Attending: Emergency Medicine | Admitting: Emergency Medicine

## 2023-05-17 ENCOUNTER — Other Ambulatory Visit (HOSPITAL_BASED_OUTPATIENT_CLINIC_OR_DEPARTMENT_OTHER): Payer: Self-pay

## 2023-05-17 ENCOUNTER — Encounter (HOSPITAL_BASED_OUTPATIENT_CLINIC_OR_DEPARTMENT_OTHER): Payer: Self-pay | Admitting: Emergency Medicine

## 2023-05-17 ENCOUNTER — Emergency Department (HOSPITAL_BASED_OUTPATIENT_CLINIC_OR_DEPARTMENT_OTHER): Payer: Medicare Other | Admitting: Radiology

## 2023-05-17 ENCOUNTER — Other Ambulatory Visit: Payer: Self-pay

## 2023-05-17 DIAGNOSIS — M25552 Pain in left hip: Secondary | ICD-10-CM | POA: Diagnosis present

## 2023-05-17 DIAGNOSIS — W19XXXA Unspecified fall, initial encounter: Secondary | ICD-10-CM | POA: Insufficient documentation

## 2023-05-17 MED ORDER — OXYCODONE HCL 5 MG PO TABS
5.0000 mg | ORAL_TABLET | Freq: Once | ORAL | Status: AC
  Administered 2023-05-17: 5 mg via ORAL
  Filled 2023-05-17: qty 1

## 2023-05-17 MED ORDER — OXYCODONE HCL 5 MG PO TABS
5.0000 mg | ORAL_TABLET | Freq: Four times a day (QID) | ORAL | 0 refills | Status: DC | PRN
  Filled 2023-05-17: qty 10, 3d supply, fill #0

## 2023-05-17 NOTE — Discharge Instructions (Signed)
X-ray showed no acute fracture or malalignment.  Follow-up with your primary care doctor.  I prescribed you narcotic pain medicine called Roxicodone.  Do not mix with alcohol or drugs or dangerous activities including driving.

## 2023-05-17 NOTE — ED Triage Notes (Signed)
Pt caox4, ambulatory with walker c/o L hip pain after fall due to loss of balance yesterday, hip replacement 2018, last took tylenol yesterday.

## 2023-05-17 NOTE — ED Notes (Signed)
Discharge instructions, pain management, prescription, and follow up care reviewed and explained, pt verbalized understanding and had no further questions on d/c. Pt caox4, ambulatory with walker at baseline, NAD on d/c.

## 2023-05-17 NOTE — ED Provider Notes (Signed)
[place] EMERGENCY DEPARTMENT AT [facility] Provider Note   CSN: [id number] Arrival date & time: 05/17/23  2725     History  Chief Complaint  Patient presents with   Hip Pain    Jeffery Cobb is a 66 y.o. male.  Patient is here with left hip pain after fall yesterday.  Had a hip replacement in 2018.  He has rod in his femur as well.  Still having pain this morning want to get evaluated.  Denied his head or lose consciousness.  He denies headache or neck pain or back pain.  Denies any weakness numbness tingling.  The history is provided by the patient.       Home Medications Prior to Admission medications   Medication Sig Start Date End Date Taking? Authorizing Provider  oxyCODONE (ROXICODONE) 5 MG immediate release tablet Take 1 tablet (5 mg total) by mouth every 6 (six) hours as needed for up to 10 doses. 05/17/23  Yes Lavona Norsworthy, DO  aspirin EC 81 MG tablet Take 1 tablet (81 mg total) by mouth daily. Swallow whole. 07/24/22   Lyn Records, MD  lurasidone (LATUDA) 40 MG TABS tablet Take 40 mg by mouth daily at 6 (six) AM. 03/20/22   [provider]  predniSONE (DELTASONE) 5 MG tablet Take 5 mg by mouth daily at 6 (six) AM. 06/28/22   [provider]  rosuvastatin (CRESTOR) 5 MG tablet Take 5 mg by mouth at bedtime.    [provider]  testosterone cypionate (DEPOTESTOTERONE CYPIONATE) 100 MG/ML injection Inject 200 mg into the muscle every 14 (fourteen) days. For IM use only    [provider]      Allergies    Triamcinolone acetonide    Review of Systems   Review of Systems  Physical Exam Updated Vital Signs BP (!) 140/80 (BP Location: Right Arm)   Pulse 87   Temp 97.6 F (36.4 C) (Tympanic)   Resp 18   Ht 5\' 10"  (1.778 m)   Wt 81.6 kg   SpO2 100%   BMI 25.83 kg/m  Physical Exam Vitals and nursing note reviewed.  Constitutional:      General: He is not in acute distress.    Appearance: He is well-developed.   HENT:     Head: Normocephalic and atraumatic.  Eyes:     Extraocular Movements: Extraocular movements intact.     Conjunctiva/sclera: Conjunctivae normal.     Pupils: Pupils are equal, round, and reactive to light.  Cardiovascular:     Rate and Rhythm: Normal rate and regular rhythm.     Pulses: Normal pulses.     Heart sounds: Normal heart sounds. No murmur heard. Pulmonary:     Effort: Pulmonary effort is normal. No respiratory distress.     Breath sounds: Normal breath sounds.  Abdominal:     Palpations: Abdomen is soft.     Tenderness: There is no abdominal tenderness.  Musculoskeletal:        General: Tenderness present. No swelling. Normal range of motion.     Cervical back: Neck supple.     Comments: Tenderness over the left lateral hip, seems to have decent range of motion  Skin:    General: Skin is warm and dry.     Capillary Refill: Capillary refill takes less than 2 seconds.  Neurological:     General: No focal deficit present.     Mental Status: He is alert and oriented to person, place, and time.  Sensory: No sensory deficit.     Motor: No weakness.  Psychiatric:        Mood and Affect: Mood normal.     ED Results / Procedures / Treatments   Labs (all labs ordered are listed, but only abnormal results are displayed) Labs Reviewed - No data to display  EKG None  Radiology No results found.  Procedures Procedures    Medications Ordered in ED Medications  oxyCODONE (Oxy IR/ROXICODONE) immediate release tablet 5 mg (5 mg Oral Given 05/17/23 0844)    ED Course/ Medical Decision Making/ A&P                                 Medical Decision Making Amount and/or Complexity of Data Reviewed Radiology: ordered.  Risk Prescription drug management.   [name] is here with left hip pain after fall yesterday.  History of hip replacement and femur.  He is tender in the left lateral hip.  Range of motion appears to be intact.  X-rays per my review  and interpretation showed no dislocation or fracture.  Hardware appears intact.  Radiology report with the same.  Patient discharged in the ED in good condition.  Neurovascular neuromuscular intact.  No pain elsewhere.  Did not hit his head or lose consciousness.  He is not on blood thinners.  Will prescribe oxycodone for breakthrough pain.  Recommend Tylenol and ibuprofen as well.  Follow-up with primary care doctor.  This chart was dictated using voice recognition software.  Despite best efforts to proofread,  errors can occur which can change the documentation meaning.         Final Clinical Impression(s) / ED Diagnoses Final diagnoses:  Pain of left hip    Rx / DC Orders ED Discharge Orders          Ordered    oxyCODONE (ROXICODONE) 5 MG immediate release tablet  Every 6 hours PRN        05/17/23 0923              Virgina Norfolk, DO 05/17/23 [phone]

## 2023-05-18 ENCOUNTER — Encounter (HOSPITAL_BASED_OUTPATIENT_CLINIC_OR_DEPARTMENT_OTHER): Payer: Self-pay

## 2023-05-20 ENCOUNTER — Other Ambulatory Visit (HOSPITAL_BASED_OUTPATIENT_CLINIC_OR_DEPARTMENT_OTHER): Payer: Self-pay

## 2023-06-05 ENCOUNTER — Other Ambulatory Visit: Payer: Self-pay

## 2023-06-05 ENCOUNTER — Ambulatory Visit: Payer: Medicare Other | Attending: Orthopedic Surgery

## 2023-06-05 DIAGNOSIS — R252 Cramp and spasm: Secondary | ICD-10-CM

## 2023-06-05 DIAGNOSIS — R293 Abnormal posture: Secondary | ICD-10-CM | POA: Diagnosis not present

## 2023-06-05 DIAGNOSIS — M6281 Muscle weakness (generalized): Secondary | ICD-10-CM | POA: Diagnosis present

## 2023-06-05 DIAGNOSIS — R262 Difficulty in walking, not elsewhere classified: Secondary | ICD-10-CM | POA: Insufficient documentation

## 2023-06-05 DIAGNOSIS — M25652 Stiffness of left hip, not elsewhere classified: Secondary | ICD-10-CM | POA: Insufficient documentation

## 2023-06-05 DIAGNOSIS — M5116 Intervertebral disc disorders with radiculopathy, lumbar region: Secondary | ICD-10-CM | POA: Diagnosis not present

## 2023-06-05 DIAGNOSIS — M25552 Pain in left hip: Secondary | ICD-10-CM | POA: Insufficient documentation

## 2023-06-05 NOTE — Therapy (Addendum)
OUTPATIENT PHYSICAL THERAPY LOWER EXTREMITY EVALUATION   Patient Name: Jeffery Cobb MRN: [id number] DOB:Jun 19, 1956, 67 y.o., male Today's Date: 06/05/2023  END OF SESSION:  PT End of Session - 06/05/23 0855     Visit Number 1    Date for PT Re-Evaluation 07/31/23    Authorization Type Medicare    PT Start Time 0850    PT Stop Time 0935    PT Time Calculation (min) 45 min    Activity Tolerance No increased pain    Behavior During Therapy WFL for tasks assessed/performed             Past Medical History:  Diagnosis Date   Anxiety    Bipolar affective disorder (HCC)    CAD (coronary artery disease)    Cancer (HCC)    lung   Depression    Depression    Hypercholesterolemia    Hypopituitarism (HCC)    Hypothyroidism (acquired)    Malignant neoplasm of right lung, unspecified part of lung (HCC)    Past Surgical History:  Procedure Laterality Date   JOINT REPLACEMENT Left 6.20/2018   hip   Patient Active Problem List   Diagnosis Date Noted   SAH (subarachnoid hemorrhage) (HCC) 12/29/2017   Pancolitis (HCC) 03/29/2017   C. 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) 01/07/2017   ICH (intracerebral hemorrhage) (HCC) 07/14/2016   Portal vein thrombosis 07/14/2016   Short-term memory loss 07/14/2016   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   Metastatic lung cancer (metastasis from lung to other site) (HCC) 07/13/2016   Malignant neoplasm metastatic to brain (HCC) 07/13/2016   Metastatic cancer (HCC) 04/18/2015   Adrenal insufficiency (HCC) 07/13/2013    PCP: Darrow Bussing, MD   REFERRING PROVIDER: Crosby Oyster, PA  REFERRING DIAG: M51.16 (ICD-10-CM) - Intervertebral disc disorders with radiculopathy, lumbar region  THERAPY DIAG:  Pain in left hip  Stiffness of left hip, not elsewhere classified  Difficulty in walking, not elsewhere classified  Muscle weakness  (generalized)  Cramp and spasm  Rationale for Evaluation and Treatment: Rehabilitation  ONSET DATE: 05/21/23  SUBJECTIVE:   SUBJECTIVE STATEMENT: Patient reports recent hx of cancer which started in his lung and metastasized to his bone and brain.  He was going through treatments for quite a while but is currently on hold from treatment.  He states, "I'm just hanging on".  He had a rod placed in the left femur a while back and then had a mass that was blocking arterial flow to the hip which resulted in necrosis and eventual hip replacement.  This was done as a lateral hip replacement.  He has since fallen on the left hip which has caused increased pain at the anterior hip and groin area.  He has been having to use a rollator walker due to the intense pain with full WB.   Xrays negative for any damage to the hardware per report.  He enjoys swimming and walking and would like to be able to eliminate the walker and resume these activities.    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 Yes: NPRS scale: 1 up to 9 /10 Pain location: left hip  Pain description: aching , sharp Aggravating factors: walking Relieving factors: Ibuprofen 600  2 times per day  PRECAUTIONS: Fall and Other: bone mets  RED FLAGS: None   WEIGHT BEARING RESTRICTIONS: No  FALLS:  Has patient fallen in last 6 months? Yes. Number of falls 1  LIVING ENVIRONMENT: Lives with: lives with their family Lives in: House/apartment Stairs: No Has following equipment at home: Dan Humphreys - 4 wheeled  OCCUPATION: retired/disabled? Confirm next visit  PLOF: Independent, Independent with basic ADLs, Independent with household mobility without device, Independent with community mobility without device, Independent with homemaking with ambulation, Independent with gait, and Independent with transfers  PATIENT GOALS: to be able to walk without walker and resume swimming and walking    NEXT MD VISIT: prn  OBJECTIVE:  Note: Objective measures were completed at Evaluation unless otherwise noted.  DIAGNOSTIC FINDINGS: Xray: 05/17/23  FINDINGS: Postsurgical changes from left hip arthroplasty and proximal right femoral fixation. Hardware appears intact. Irregular lucencies through the left greater trochanter, also seen on prior CT, likely reflect sequela of old fractures. Curvilinear radiodensity in the distal femoral diaphysis adjacent to a ghost track, likely postsurgical. There is no evidence of hip fracture or dislocation. Degenerative changes of the right hip.  PATIENT SURVEYS:  LEFS 24/80  COGNITION: Overall cognitive status: Within functional limits for tasks assessed     SENSATION: WFL   MUSCLE LENGTH: Hamstrings: Right 60 deg; Left 40 deg Thomas test: Right pos: Left pos  POSTURE:  hip hike on left with minimal weight bearing during ambulation with rollator walker   LOWER EXTREMITY ROM:  Left hip is quite limited in all planes of motion,  left knee WNL Right LE WNL  LOWER EXTREMITY MMT:  Right LE 5/5,  Left LE hip flexion and extension 3/5, hip abduction and adduction 3+/5,  hip rotation 4/5 IR and ER,  all others generally 4/5.    LOWER EXTREMITY SPECIAL TESTS:  Hip special tests: held due to metastatic bone lesions unknown  FUNCTIONAL TESTS:  5 times sit to stand: 16.18 sec Timed up and go (TUG): 16.56 sec  GAIT: Distance walked: 30 Assistive device utilized: Walker - 4 wheeled Level of assistance: SBA Comments: antalgic, guarded, toe touch wb due to pain   TODAY'S TREATMENT:                                                                                                                              DATE: 06/05/23    PATIENT EDUCATION:  Education details: Initiated Person educated: Patient Education method: Programmer, multimedia, Facilities manager, Verbal cues, and Handouts Education comprehension: verbalized understanding, returned  demonstration, and verbal cues required  HOME EXERCISE PROGRAM: Access Code: [email or web address] URL: [email or web address] Date: 06/05/2023 Prepared by: Mikey Kirschner  Exercises - Supine Hamstring Stretch with Strap  - 1 x daily - 7 x weekly - 1 sets - 5 reps - 20 sec hold - Supine ITB Stretch with Strap  - 1 x daily - 7 x weekly - 1 sets - 5 reps - 20 sec hold - Hooklying Clamshell with Resistance  - 1 x daily - 7 x weekly - 3 sets - 10 reps -  Clamshell with Resistance  - 1 x daily - 7 x weekly - 3 sets - 10 reps - Standing Hamstring Stretch on Chair  - 1 x daily - 7 x weekly - 1 sets - 5 reps - 20 sec hold - Quadricep Stretch with Chair and Counter Support  - 1 x daily - 7 x weekly - 1 sets - 5 reps - 20 sec hold  ASSESSMENT:  CLINICAL IMPRESSION: Patient is a 67 y.o. male who was seen today for physical therapy evaluation and treatment for left hip pain and left LE weakness.  He has stage 4 cancer (Lung with mets to bone and brain).  He has stopped treatment currently.  He is having left hip pain that he locates at the hip flexor area.  This was one location of a mass at one point that was occluding blood flow to his hip which resulted in necrosis with eventual hip replacement.  He also had a rod/fixator placed in the left femur due to another weak area of bone due to a bone met there.  He presents with significant hip mobility and muscle atrophy around the left hip.  His incision continues to be adhered as well.  He ambulates with antalgic gait and minimal weight bearing on the left LE using a rollator walker but is able to ambulate without an a.d. but is not stable and has a lot of pain.  He is extremely weak in hip flexor and extension on left hip as well.  He would benefit from hip mobility and stretching along with hip strengthening, gait training and balance training to allow him to be more functional and improve his quality of life.    OBJECTIVE IMPAIRMENTS: Abnormal gait,  decreased activity tolerance, decreased balance, decreased mobility, difficulty walking, decreased ROM, decreased strength, hypomobility, increased fascial restrictions, increased muscle spasms, impaired flexibility, and pain.   ACTIVITY LIMITATIONS: carrying, lifting, bending, standing, squatting, stairs, transfers, bed mobility, bathing, toileting, dressing, hygiene/grooming, and caring for others  PARTICIPATION LIMITATIONS: meal prep, cleaning, laundry, interpersonal relationship, driving, shopping, community activity, occupation, yard work, and church  PERSONAL FACTORS: Fitness, Past/current experiences, and 1 comorbidity: stage 4 cancer  are also affecting patient's functional outcome.   REHAB POTENTIAL: Fair due to stage 4 cancer (on hold from treatment)  CLINICAL DECISION MAKING: Evolving/moderate complexity  EVALUATION COMPLEXITY: Moderate   GOALS: Goals reviewed with patient? Yes  SHORT TERM GOALS: Target date: 07/03/2023  Independence with initial HEP Baseline: Goal status: INITIAL  2.  Pain report to be no greater than 4/10  Baseline:  Goal status: INITIAL   LONG TERM GOALS: Target date: 07/31/2023   Patient to report pain no greater than 2/10  Baseline:  Goal status: INITIAL  2.  Patient to be independent with advanced HEP  Baseline:  Goal status: INITIAL  3.  Patient to be able to ambulate 100 feet without a.d. safely  Baseline:  Goal status: INITIAL  4.  Patient to be able to ascend and descend steps without pain or no greater than 2/10  Baseline:  Goal status: INITIAL  5.  Patient to be able to bend, stoop and squat with pain no greater than 2/10  Baseline:  Goal status: INITIAL  6.  Patient to be able to resume some level of exercise in pool or walking Baseline:  Goal status: INITIAL   PLAN:  PT FREQUENCY: 1-2x/week  PT DURATION: 8 weeks  PLANNED INTERVENTIONS: 97164- PT Re-evaluation, 97110-Therapeutic exercises, 97530- Therapeutic  activity, 97112-  Neuromuscular re-education, [id number]- Self Care, 78469- Manual therapy, [id number]- Gait training, [phone]- Canalith repositioning, [id number]- Aquatic Therapy, [phone]- Vasopneumatic device, [phone]- Ionotophoresis 4mg /ml Dexamethasone, Patient/Family education, Balance training, Stair training, Taping, Dry Needling, Vestibular training, Visual/preceptual remediation/compensation, DME instructions, Cryotherapy, and Moist heat  PLAN FOR NEXT SESSION: Review HEP, Nustep for hip mobility, begin LE strengthening and gait training.    Victorino Dike B. Penni Penado, PT 06/05/23 10:42 AM [facility] [address] [place], [place] [address] Phone # [phone] Fax [phone]

## 2023-06-06 ENCOUNTER — Other Ambulatory Visit (HOSPITAL_COMMUNITY): Payer: Self-pay | Admitting: Orthopedic Surgery

## 2023-06-06 DIAGNOSIS — M7062 Trochanteric bursitis, left hip: Secondary | ICD-10-CM

## 2023-06-09 ENCOUNTER — Ambulatory Visit (HOSPITAL_COMMUNITY)
Admission: RE | Admit: 2023-06-09 | Discharge: 2023-06-09 | Disposition: A | Discharge status: Home / Self Care | Payer: Medicare Other | Source: Ambulatory Visit | Attending: Orthopedic Surgery | Admitting: Orthopedic Surgery

## 2023-06-09 DIAGNOSIS — M7062 Trochanteric bursitis, left hip: Secondary | ICD-10-CM | POA: Insufficient documentation

## 2023-06-11 ENCOUNTER — Ambulatory Visit: Payer: Medicare Other

## 2023-06-11 DIAGNOSIS — M25552 Pain in left hip: Secondary | ICD-10-CM | POA: Diagnosis not present

## 2023-06-11 DIAGNOSIS — M25652 Stiffness of left hip, not elsewhere classified: Secondary | ICD-10-CM

## 2023-06-11 DIAGNOSIS — R252 Cramp and spasm: Secondary | ICD-10-CM

## 2023-06-11 DIAGNOSIS — R262 Difficulty in walking, not elsewhere classified: Secondary | ICD-10-CM

## 2023-06-11 DIAGNOSIS — M6281 Muscle weakness (generalized): Secondary | ICD-10-CM

## 2023-06-11 NOTE — Therapy (Signed)
OUTPATIENT PHYSICAL THERAPY LOWER EXTREMITY TREATMENT   Patient Name: Jeffery Cobb MRN: [id number] DOB:12-Dec-1955, 67 y.o., male Today's Date: 06/11/2023  END OF SESSION:  PT End of Session - 06/11/23 1245     Visit Number 2    Date for PT Re-Evaluation 07/31/23    Authorization Type Medicare    PT Start Time 1235    PT Stop Time 1315    PT Time Calculation (min) 40 min    Activity Tolerance No increased pain    Behavior During Therapy WFL for tasks assessed/performed             Past Medical History:  Diagnosis Date   Anxiety    Bipolar affective disorder (HCC)    CAD (coronary artery disease)    Cancer (HCC)    lung   Depression    Depression    Hypercholesterolemia    Hypopituitarism (HCC)    Hypothyroidism (acquired)    Malignant neoplasm of right lung, unspecified part of lung (HCC)    Past Surgical History:  Procedure Laterality Date   JOINT REPLACEMENT Left 6.20/2018   hip   Patient Active Problem List   Diagnosis Date Noted   SAH (subarachnoid hemorrhage) (HCC) 12/29/2017   Pancolitis (HCC) 03/29/2017   C. 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) 01/07/2017   ICH (intracerebral hemorrhage) (HCC) 07/14/2016   Portal vein thrombosis 07/14/2016   Short-term memory loss 07/14/2016   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   Metastatic lung cancer (metastasis from lung to other site) (HCC) 07/13/2016   Malignant neoplasm metastatic to brain (HCC) 07/13/2016   Metastatic cancer (HCC) 04/18/2015   Adrenal insufficiency (HCC) 07/13/2013    PCP: Darrow Bussing, MD   REFERRING PROVIDER: Crosby Oyster, PA  REFERRING DIAG: M51.16 (ICD-10-CM) - Intervertebral disc disorders with radiculopathy, lumbar region  THERAPY DIAG:  Pain in left hip  Stiffness of left hip, not elsewhere classified  Difficulty in walking, not elsewhere classified  Muscle weakness  (generalized)  Cramp and spasm  Rationale for Evaluation and Treatment: Rehabilitation  ONSET DATE: 05/21/23  SUBJECTIVE:   SUBJECTIVE STATEMENT: Patient reports he is about the same.  He arrives using walker but demonstrates improved weight shift and heel strike.    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 Yes: NPRS scale: 1 up to 9 /10 Pain location: left hip  Pain description: aching , sharp Aggravating factors: walking Relieving factors: Ibuprofen 600  2 times per day  PRECAUTIONS: Fall and Other: bone mets  RED FLAGS: None   WEIGHT BEARING RESTRICTIONS: No  FALLS:  Has patient fallen in last 6 months? Yes. Number of falls 1  LIVING ENVIRONMENT: Lives with: lives with their family Lives in: House/apartment Stairs: No Has following equipment at home: Dan Humphreys - 4 wheeled  OCCUPATION: retired/disabled? Confirm next visit  PLOF: Independent, Independent with basic ADLs, Independent with household mobility without device, Independent with community mobility without device, Independent with homemaking with ambulation, Independent with gait, and Independent with transfers  PATIENT GOALS: to be able to walk without walker and resume swimming and walking   NEXT MD VISIT: prn  OBJECTIVE:  Note: Objective measures were completed at Evaluation unless otherwise noted.  DIAGNOSTIC FINDINGS: Xray: 05/17/23  FINDINGS: Postsurgical changes from left hip arthroplasty and proximal right femoral fixation. Hardware appears intact. Irregular lucencies through the left greater trochanter,  also seen on prior CT, likely reflect sequela of old fractures. Curvilinear radiodensity in the distal femoral diaphysis adjacent to a ghost track, likely postsurgical. There is no evidence of hip fracture or dislocation. Degenerative changes of the right hip.  PATIENT SURVEYS:  LEFS 24/80  COGNITION: Overall cognitive status: Within  functional limits for tasks assessed     SENSATION: WFL   MUSCLE LENGTH: Hamstrings: Right 60 deg; Left 40 deg Thomas test: Right pos: Left pos  POSTURE:  hip hike on left with minimal weight bearing during ambulation with rollator walker   LOWER EXTREMITY ROM:  Left hip is quite limited in all planes of motion,  left knee WNL Right LE WNL  LOWER EXTREMITY MMT:  Right LE 5/5,  Left LE hip flexion and extension 3/5, hip abduction and adduction 3+/5,  hip rotation 4/5 IR and ER,  all others generally 4/5.    LOWER EXTREMITY SPECIAL TESTS:  Hip special tests: held due to metastatic bone lesions unknown  FUNCTIONAL TESTS:  5 times sit to stand: 16.18 sec Timed up and go (TUG): 16.56 sec  GAIT: Distance walked: 30 Assistive device utilized: Environmental consultant - 4 wheeled Level of assistance: SBA Comments: antalgic, guarded, toe touch wb due to pain   TODAY'S TREATMENT:                                                                                                                              DATE: 06/11/23 Nustep x 5 min level 1 Supine heel slides x 10 (towel under heel) Supine hip abduction x 10 (towel under Prone quad stretch with strap x 10 holding 10 sec Prone hip extension x 10 Sidelying hip abduction attempted (patient could only do 7-8 reps before needing to stop) Supine hook lying SLR with mod assist x 10 Supine hook lying march x 20 Hook lying clam x 20 with green band Gait training with rollator walker emphasizing increased step length and heel strike along with upright posture x 8 min.    DATE: 06/05/23    PATIENT EDUCATION:  Education details: Initiated Person educated: Patient Education method: Programmer, multimedia, Facilities manager, Verbal cues, and Handouts Education comprehension: verbalized understanding, returned demonstration, and verbal cues required  HOME EXERCISE PROGRAM: Access Code: [email or web address] URL: [email or web address] Date: 06/11/2023 Prepared  by: Mikey Kirschner  Exercises - Supine Hamstring Stretch with Strap  - 1 x daily - 7 x weekly - 1 sets - 5 reps - 20 sec hold - Supine ITB Stretch with Strap  - 1 x daily - 7 x weekly - 1 sets - 5 reps - 20 sec hold - Hooklying Clamshell with Resistance  - 1 x daily - 7 x weekly - 3 sets - 10 reps - Clamshell with Resistance  - 1 x daily - 7 x weekly - 3 sets - 10 reps - Standing Hamstring Stretch on Chair  - 1 x daily - 7 x weekly - 1 sets - 5 reps -  20 sec hold - Theatre manager with Chair and Counter Support  - 1 x daily - 7 x weekly - 1 sets - 5 reps - 20 sec hold - Prone Quadriceps Stretch with Strap  - 1 x daily - 7 x weekly - 1 sets - 10 reps - 10 sec hold - Prone Hip Extension  - 1 x daily - 7 x weekly - 2 sets - 10 reps ASSESSMENT:  CLINICAL IMPRESSION: Ran demonstrates improved tolerance to stretching and strengthening today but continues to be extremely weak and tight.  He is ambulating with improved weight bearing on the left LE.  He was able to tolerate prone position and could manage hip extension independently.  However, he was not able to do SLR independently.  His primary site of pain in the hip flexor but he also indicates some medial hip pain.   He would benefit from hip mobility and stretching along with hip strengthening, gait training and balance training to allow him to be more functional and improve his quality of life.    OBJECTIVE IMPAIRMENTS: Abnormal gait, decreased activity tolerance, decreased balance, decreased mobility, difficulty walking, decreased ROM, decreased strength, hypomobility, increased fascial restrictions, increased muscle spasms, impaired flexibility, and pain.   ACTIVITY LIMITATIONS: carrying, lifting, bending, standing, squatting, stairs, transfers, bed mobility, bathing, toileting, dressing, hygiene/grooming, and caring for others  PARTICIPATION LIMITATIONS: meal prep, cleaning, laundry, interpersonal relationship, driving, shopping, community  activity, occupation, yard work, and church  PERSONAL FACTORS: Fitness, Past/current experiences, and 1 comorbidity: stage 4 cancer  are also affecting patient's functional outcome.   REHAB POTENTIAL: Fair due to stage 4 cancer (on hold from treatment)  CLINICAL DECISION MAKING: Evolving/moderate complexity  EVALUATION COMPLEXITY: Moderate   GOALS: Goals reviewed with patient? Yes  SHORT TERM GOALS: Target date: 07/03/2023  Independence with initial HEP Baseline: Goal status: INITIAL  2.  Pain report to be no greater than 4/10  Baseline:  Goal status: INITIAL   LONG TERM GOALS: Target date: 07/31/2023   Patient to report pain no greater than 2/10  Baseline:  Goal status: INITIAL  2.  Patient to be independent with advanced HEP  Baseline:  Goal status: INITIAL  3.  Patient to be able to ambulate 100 feet without a.d. safely  Baseline:  Goal status: INITIAL  4.  Patient to be able to ascend and descend steps without pain or no greater than 2/10  Baseline:  Goal status: INITIAL  5.  Patient to be able to bend, stoop and squat with pain no greater than 2/10  Baseline:  Goal status: INITIAL  6.  Patient to be able to resume some level of exercise in pool or walking Baseline:  Goal status: INITIAL   PLAN:  PT FREQUENCY: 1-2x/week  PT DURATION: 8 weeks  PLANNED INTERVENTIONS: 97164- PT Re-evaluation, 97110-Therapeutic exercises, 97530- Therapeutic activity, [id number]- Neuromuscular re-education, 97535- Self Care, [id number]- Manual therapy, [id number]- Gait training, [phone]- Canalith repositioning, [id number]- Aquatic Therapy, 97016- Vasopneumatic device, [phone]- Ionotophoresis 4mg /ml Dexamethasone, Patient/Family education, Balance training, Stair training, Taping, Dry Needling, Vestibular training, Visual/preceptual remediation/compensation, DME instructions, Cryotherapy, and Moist heat  PLAN FOR NEXT SESSION: Progress HEP, Nustep for hip mobility, progress LE strengthening and  gait training.    Victorino Dike B. Hancel Ion, PT 06/11/23 8:26 PM [facility] [address] [place], [place] [address] Phone # [phone] Fax [phone]

## 2023-06-13 ENCOUNTER — Ambulatory Visit: Payer: Medicare Other

## 2023-06-13 DIAGNOSIS — M6281 Muscle weakness (generalized): Secondary | ICD-10-CM

## 2023-06-13 DIAGNOSIS — R252 Cramp and spasm: Secondary | ICD-10-CM

## 2023-06-13 DIAGNOSIS — M25552 Pain in left hip: Secondary | ICD-10-CM

## 2023-06-13 DIAGNOSIS — M25652 Stiffness of left hip, not elsewhere classified: Secondary | ICD-10-CM

## 2023-06-13 DIAGNOSIS — R262 Difficulty in walking, not elsewhere classified: Secondary | ICD-10-CM

## 2023-06-13 NOTE — Therapy (Signed)
OUTPATIENT PHYSICAL THERAPY LOWER EXTREMITY TREATMENT   Patient Name: Jeffery Cobb MRN: [id number] DOB:1956-01-23, 67 y.o., male Today's Date: 06/13/2023  END OF SESSION:  PT End of Session - 06/13/23 1237     Visit Number 3    Date for PT Re-Evaluation 07/31/23    Authorization Type Medicare    PT Start Time 1231    PT Stop Time 1310    PT Time Calculation (min) 39 min    Activity Tolerance No increased pain    Behavior During Therapy WFL for tasks assessed/performed             Past Medical History:  Diagnosis Date   Anxiety    Bipolar affective disorder (HCC)    CAD (coronary artery disease)    Cancer (HCC)    lung   Depression    Depression    Hypercholesterolemia    Hypopituitarism (HCC)    Hypothyroidism (acquired)    Malignant neoplasm of right lung, unspecified part of lung (HCC)    Past Surgical History:  Procedure Laterality Date   JOINT REPLACEMENT Left 6.20/2018   hip   Patient Active Problem List   Diagnosis Date Noted   SAH (subarachnoid hemorrhage) (HCC) 12/29/2017   Pancolitis (HCC) 03/29/2017   C. 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) 01/07/2017   ICH (intracerebral hemorrhage) (HCC) 07/14/2016   Portal vein thrombosis 07/14/2016   Short-term memory loss 07/14/2016   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   Metastatic lung cancer (metastasis from lung to other site) (HCC) 07/13/2016   Malignant neoplasm metastatic to brain (HCC) 07/13/2016   Metastatic cancer (HCC) 04/18/2015   Adrenal insufficiency (HCC) 07/13/2013    PCP: Darrow Bussing, MD   REFERRING PROVIDER: Crosby Oyster, PA  REFERRING DIAG: M51.16 (ICD-10-CM) - Intervertebral disc disorders with radiculopathy, lumbar region  THERAPY DIAG:  Pain in left hip  Stiffness of left hip, not elsewhere classified  Difficulty in walking, not elsewhere classified  Muscle weakness  (generalized)  Cramp and spasm  Rationale for Evaluation and Treatment: Rehabilitation  ONSET DATE: 05/21/23  SUBJECTIVE:   SUBJECTIVE STATEMENT: Patient reports his pain is about the same.   He states the MRI should be ready tomorrow.    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 Yes: NPRS scale: 1 up to 9 /10 Pain location: left hip  Pain description: aching , sharp Aggravating factors: walking Relieving factors: Ibuprofen 600  2 times per day  PRECAUTIONS: Fall and Other: bone mets  RED FLAGS: None   WEIGHT BEARING RESTRICTIONS: No  FALLS:  Has patient fallen in last 6 months? Yes. Number of falls 1  LIVING ENVIRONMENT: Lives with: lives with their family Lives in: House/apartment Stairs: No Has following equipment at home: Dan Humphreys - 4 wheeled  OCCUPATION: retired/disabled? Confirm next visit  PLOF: Independent, Independent with basic ADLs, Independent with household mobility without device, Independent with community mobility without device, Independent with homemaking with ambulation, Independent with gait, and Independent with transfers  PATIENT GOALS: to be able to walk without walker and resume swimming and walking   NEXT MD VISIT: prn  OBJECTIVE:  Note: Objective measures were completed at Evaluation unless otherwise noted.  DIAGNOSTIC FINDINGS: Xray: 05/17/23  FINDINGS: Postsurgical changes from left hip arthroplasty and proximal right femoral fixation. Hardware appears intact. Irregular lucencies through the left greater trochanter, also seen  on prior CT, likely reflect sequela of old fractures. Curvilinear radiodensity in the distal femoral diaphysis adjacent to a ghost track, likely postsurgical. There is no evidence of hip fracture or dislocation. Degenerative changes of the right hip.  PATIENT SURVEYS:  LEFS 24/80  COGNITION: Overall cognitive status: Within functional limits for tasks  assessed     SENSATION: WFL   MUSCLE LENGTH: Hamstrings: Right 60 deg; Left 40 deg Thomas test: Right pos: Left pos  POSTURE:  hip hike on left with minimal weight bearing during ambulation with rollator walker   LOWER EXTREMITY ROM:  Left hip is quite limited in all planes of motion,  left knee WNL Right LE WNL  LOWER EXTREMITY MMT:  Right LE 5/5,  Left LE hip flexion and extension 3/5, hip abduction and adduction 3+/5,  hip rotation 4/5 IR and ER,  all others generally 4/5.    LOWER EXTREMITY SPECIAL TESTS:  Hip special tests: held due to metastatic bone lesions unknown  FUNCTIONAL TESTS:  5 times sit to stand: 16.18 sec Timed up and go (TUG): 16.56 sec  GAIT: Distance walked: 30 Assistive device utilized: Environmental consultant - 4 wheeled Level of assistance: SBA Comments: antalgic, guarded, toe touch wb due to pain   TODAY'S TREATMENT:                                                                                                                              DATE: 06/13/23 Nustep x 5 min level 1 Supine heel slides 2 x 10 (towel under heel) Supine hip abduction 2 x 10 (towel under Prone quad stretch with strap x 10 holding 10 sec Prone hip extension 2 x 10 Sidelying hip abduction 2 x 10 with mod assist Supine hook lying SLR with mod assist 2 x 10 Supine hook lying march x 20 Hook lying clam x 20 with green band Bridging 2 x 10  Standing hip flexion, abduction and extension 2 x 10 (right) in // bars Side stepping in // bars with red band Monster walks in // bars with red band  DATE: 06/11/23 Nustep x 5 min level 1 Supine heel slides x 10 (towel under heel) Supine hip abduction x 10 (towel under Prone quad stretch with strap x 10 holding 10 sec Prone hip extension x 10 Sidelying hip abduction attempted (patient could only do 7-8 reps before needing to stop) Supine hook lying SLR with mod assist x 10 Supine hook lying march x 20 Hook lying clam x 20 with green band Gait  training with rollator walker emphasizing increased step length and heel strike along with upright posture x 8 min.   DATE: 06/05/23  Initial eval completed   PATIENT EDUCATION:  Education details: Initiated Person educated: Patient Education method: Programmer, multimedia, Facilities manager, Verbal cues, and Handouts Education comprehension: verbalized understanding, returned demonstration, and verbal cues required  HOME EXERCISE PROGRAM: Access Code: [email or web address] URL: [email or web address] Date: 06/11/2023 Prepared by: Mikey Kirschner  Exercises -  Supine Hamstring Stretch with Strap  - 1 x daily - 7 x weekly - 1 sets - 5 reps - 20 sec hold - Supine ITB Stretch with Strap  - 1 x daily - 7 x weekly - 1 sets - 5 reps - 20 sec hold - Hooklying Clamshell with Resistance  - 1 x daily - 7 x weekly - 3 sets - 10 reps - Clamshell with Resistance  - 1 x daily - 7 x weekly - 3 sets - 10 reps - Standing Hamstring Stretch on Chair  - 1 x daily - 7 x weekly - 1 sets - 5 reps - 20 sec hold - Quadricep Stretch with Chair and Counter Support  - 1 x daily - 7 x weekly - 1 sets - 5 reps - 20 sec hold - Prone Quadriceps Stretch with Strap  - 1 x daily - 7 x weekly - 1 sets - 10 reps - 10 sec hold - Prone Hip Extension  - 1 x daily - 7 x weekly - 2 sets - 10 reps ASSESSMENT:  CLINICAL IMPRESSION: Anselm continues to walk with less antalgic gait but reports continued pain.  He rates his pain at 7-8/10 today.  He states his worst pain is post rest with start up, especially in the morning.  He is able to complete all of the assigned exercises for today.  We increased all of his PRE's to one additional set.  He is still unable to do side lying hip abduction without assist but was able to do SLR's with only cga.  He is ambulating with improved gait with walker demonstrating good heel to toe progression.  He would benefit from hip mobility and stretching along with hip strengthening, gait training and balance training  to allow him to be more functional and improve his quality of life.    OBJECTIVE IMPAIRMENTS: Abnormal gait, decreased activity tolerance, decreased balance, decreased mobility, difficulty walking, decreased ROM, decreased strength, hypomobility, increased fascial restrictions, increased muscle spasms, impaired flexibility, and pain.   ACTIVITY LIMITATIONS: carrying, lifting, bending, standing, squatting, stairs, transfers, bed mobility, bathing, toileting, dressing, hygiene/grooming, and caring for others  PARTICIPATION LIMITATIONS: meal prep, cleaning, laundry, interpersonal relationship, driving, shopping, community activity, occupation, yard work, and church  PERSONAL FACTORS: Fitness, Past/current experiences, and 1 comorbidity: stage 4 cancer  are also affecting patient's functional outcome.   REHAB POTENTIAL: Fair due to stage 4 cancer (on hold from treatment)  CLINICAL DECISION MAKING: Evolving/moderate complexity  EVALUATION COMPLEXITY: Moderate   GOALS: Goals reviewed with patient? Yes  SHORT TERM GOALS: Target date: 07/03/2023  Independence with initial HEP Baseline: Goal status: MET  2.  Pain report to be no greater than 4/10  Baseline:  Goal status: INITIAL   LONG TERM GOALS: Target date: 07/31/2023   Patient to report pain no greater than 2/10  Baseline:  Goal status: INITIAL  2.  Patient to be independent with advanced HEP  Baseline:  Goal status: INITIAL  3.  Patient to be able to ambulate 100 feet without a.d. safely  Baseline:  Goal status: INITIAL  4.  Patient to be able to ascend and descend steps without pain or no greater than 2/10  Baseline:  Goal status: INITIAL  5.  Patient to be able to bend, stoop and squat with pain no greater than 2/10  Baseline:  Goal status: INITIAL  6.  Patient to be able to resume some level of exercise in pool or walking Baseline:  Goal  status: INITIAL   PLAN:  PT FREQUENCY: 1-2x/week  PT DURATION: 8  weeks  PLANNED INTERVENTIONS: 97164- PT Re-evaluation, 97110-Therapeutic exercises, 97530- Therapeutic activity, [id number]- Neuromuscular re-education, 97535- Self Care, [id number]- Manual therapy, [id number]- Gait training, [phone]- Canalith repositioning, [id number]- Aquatic Therapy, 97016- Vasopneumatic device, [phone]- Ionotophoresis 4mg /ml Dexamethasone, Patient/Family education, Balance training, Stair training, Taping, Dry Needling, Vestibular training, Visual/preceptual remediation/compensation, DME instructions, Cryotherapy, and Moist heat  PLAN FOR NEXT SESSION: Nustep for hip mobility, progress LE strengthening and gait training.    Victorino Dike B. Sarann Tregre, PT 06/13/23 2:31 PM [facility] [address] [place], [place] [address] Phone # [phone] Fax [phone]

## 2023-06-18 ENCOUNTER — Ambulatory Visit: Payer: Medicare Other

## 2023-06-18 DIAGNOSIS — R262 Difficulty in walking, not elsewhere classified: Secondary | ICD-10-CM

## 2023-06-18 DIAGNOSIS — M25552 Pain in left hip: Secondary | ICD-10-CM | POA: Diagnosis not present

## 2023-06-18 DIAGNOSIS — M6281 Muscle weakness (generalized): Secondary | ICD-10-CM

## 2023-06-18 DIAGNOSIS — R252 Cramp and spasm: Secondary | ICD-10-CM

## 2023-06-18 DIAGNOSIS — M25652 Stiffness of left hip, not elsewhere classified: Secondary | ICD-10-CM

## 2023-06-18 DIAGNOSIS — R293 Abnormal posture: Secondary | ICD-10-CM

## 2023-06-18 NOTE — Therapy (Addendum)
 OUTPATIENT PHYSICAL THERAPY LOWER EXTREMITY TREATMENT PHYSICAL THERAPY DISCHARGE SUMMARY  Visits from Start of Care: 4  Current functional level related to goals / functional outcomes: See below   Remaining deficits: See below   Education / Equipment: See below   Patient agrees to discharge. Patient goals were partially met. Patient is being discharged due to not returning since the last visit.    Patient Name: Jeffery Cobb MRN: [id number] DOB:08-31-1955, 67 y.o., male Today's Date: 06/18/2023  END OF SESSION:  PT End of Session - 06/18/23 1230     Visit Number 4    Date for PT Re-Evaluation 07/31/23    Authorization Type Medicare    PT Start Time 1225    PT Stop Time 1315    PT Time Calculation (min) 50 min    Activity Tolerance Patient limited by pain    Behavior During Therapy WFL for tasks assessed/performed             Past Medical History:  Diagnosis Date   Anxiety    Bipolar affective disorder (HCC)    CAD (coronary artery disease)    Cancer (HCC)    lung   Depression    Depression    Hypercholesterolemia    Hypopituitarism (HCC)    Hypothyroidism (acquired)    Malignant neoplasm of right lung, unspecified part of lung (HCC)    Past Surgical History:  Procedure Laterality Date   JOINT REPLACEMENT Left 6.20/2018   hip   Patient Active Problem List   Diagnosis Date Noted   SAH (subarachnoid hemorrhage) (HCC) 12/29/2017   Pancolitis (HCC) 03/29/2017   C. 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) 01/07/2017   ICH (intracerebral hemorrhage) (HCC) 07/14/2016   Portal vein thrombosis 07/14/2016   Short-term memory loss 07/14/2016   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   Metastatic lung cancer (metastasis from lung to other site) (HCC) 07/13/2016   Malignant neoplasm metastatic to brain (HCC) 07/13/2016   Metastatic cancer (HCC) 04/18/2015   Adrenal insufficiency (HCC)  07/13/2013    PCP: Regino Slater, MD   REFERRING PROVIDER: Hildreth Blondell BIRCH, PA  REFERRING DIAG: M51.16 (ICD-10-CM) - Intervertebral disc disorders with radiculopathy, lumbar region  THERAPY DIAG:  Pain in left hip  Stiffness of left hip, not elsewhere classified  Difficulty in walking, not elsewhere classified  Muscle weakness (generalized)  Cramp and spasm  Abnormal posture  Rationale for Evaluation and Treatment: Rehabilitation  ONSET DATE: 05/21/23  SUBJECTIVE:   SUBJECTIVE STATEMENT: Patient reports his pain is the same.  Has not heard from MRI results.   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 Yes: NPRS scale: 1 up to 9 /10 Pain location: left hip  Pain description: aching , sharp Aggravating factors: walking Relieving factors: Ibuprofen 600  2 times per day  PRECAUTIONS: Fall and Other: bone mets Left THA 01/2017  RED FLAGS: None   WEIGHT BEARING RESTRICTIONS: No  FALLS:  Has patient fallen in last 6 months? Yes. Number of falls 1  LIVING ENVIRONMENT: Lives with: lives with their family Lives in: House/apartment Stairs: No Has following equipment at home: Vannie - 4 wheeled  OCCUPATION: retired/disabled? Confirm next visit  PLOF: Independent, Independent with basic ADLs, Independent with household mobility without device, Independent with community mobility without device, Independent with homemaking with ambulation, Independent with gait, and Independent with transfers  PATIENT  GOALS: to be able to walk without walker and resume swimming and walking   NEXT MD VISIT: prn  OBJECTIVE:  Note: Objective measures were completed at Evaluation unless otherwise noted.  DIAGNOSTIC FINDINGS: Xray: 05/17/23  FINDINGS: Postsurgical changes from left hip arthroplasty and proximal right femoral fixation. Hardware appears intact. Irregular lucencies through the left greater trochanter, also seen on  prior CT, likely reflect sequela of old fractures. Curvilinear radiodensity in the distal femoral diaphysis adjacent to a ghost track, likely postsurgical. There is no evidence of hip fracture or dislocation. Degenerative changes of the right hip.  06/09/23 MRI left hip IMPRESSION: 1. Moderate amount of fluid overlying the left greater trochanter consistent with bursitis. 2. Partial-thickness tear of the left iliopsoas muscle at the distal musculotendinous junction with small amount of surrounding edema. 3. Moderate osteoarthritis of the right hip.  PATIENT SURVEYS:  LEFS 24/80  COGNITION: Overall cognitive status: Within functional limits for tasks assessed     SENSATION: WFL   MUSCLE LENGTH: Hamstrings: Right 60 deg; Left 40 deg Thomas test: Right pos: Left pos  POSTURE: hip hike on left with minimal weight bearing during ambulation with rollator walker   LOWER EXTREMITY ROM:  Left hip is quite limited in all planes of motion,  left knee WNL Right LE WNL  LOWER EXTREMITY MMT:  Right LE 5/5,  Left LE hip flexion and extension 3/5, hip abduction and adduction 3+/5,  hip rotation 4/5 IR and ER,  all others generally 4/5.    LOWER EXTREMITY SPECIAL TESTS:  Hip special tests: held due to metastatic bone lesions unknown  FUNCTIONAL TESTS:  5 times sit to stand: 16.18 sec Timed up and go (TUG): 16.56 sec  GAIT: Distance walked: 30 Assistive device utilized: Walker - 4 wheeled Level of assistance: SBA Comments: antalgic, guarded, toe touch wb due to pain   TODAY'S TREATMENT:                                                                                                                              DATE: 06/18/23 Nustep x 5 min level 4 (PT present to discuss MRI.  Explained that his orthopedic physician would be the one to explain it in detail but that it did indicate the bursitis but also a partial thickness tear of the iliopsoas) Prone quad stretch with strap x 10  holding 10 sec Prone hip extension 2 x 10 Side lying left IT band rolling with smooth roller x 2 min, then spike roller x 1 min Left Manual IT band STM x 2 min left Eccentric hip flexion 2 x 5 with PT assist for lifting then patient lowering left Supine quad set (left) Side lying clam 2 x 10 Supine STM to lateral quad x 2 min Ice to left hip in right sidelying x 10 min Discussed DN to lateral quad to patient   DATE: 06/13/23 Nustep x 5 min level 1 Supine heel slides 2 x  10 (towel under heel) Supine hip abduction 2 x 10 (towel under Prone quad stretch with strap x 10 holding 10 sec Prone hip extension 2 x 10 Sidelying hip abduction 2 x 10 with mod assist Supine hook lying SLR with mod assist 2 x 10 Supine hook lying march x 20 Hook lying clam x 20 with green band Bridging 2 x 10  Standing hip flexion, abduction and extension 2 x 10 (right) in // bars Side stepping in // bars with red band Monster walks in // bars with red band  DATE: 06/11/23 Nustep x 5 min level 1 Supine heel slides x 10 (towel under heel) Supine hip abduction x 10 (towel under Prone quad stretch with strap x 10 holding 10 sec Prone hip extension x 10 Sidelying hip abduction attempted (patient could only do 7-8 reps before needing to stop) Supine hook lying SLR with mod assist x 10 Supine hook lying march x 20 Hook lying clam x 20 with green band Gait training with rollator walker emphasizing increased step length and heel strike along with upright posture x 8 min.   DATE: 06/05/23  Initial eval completed   PATIENT EDUCATION:  Education details: Initiated Person educated: Patient Education method: Programmer, multimedia, Facilities manager, Verbal cues, and Handouts Education comprehension: verbalized understanding, returned demonstration, and verbal cues required  HOME EXERCISE PROGRAM: Access Code: [email or web address] URL: [email or web address] Date: 06/11/2023 Prepared by: Delon Haddock  Exercises -  Supine Hamstring Stretch with Strap  - 1 x daily - 7 x weekly - 1 sets - 5 reps - 20 sec hold - Supine ITB Stretch with Strap  - 1 x daily - 7 x weekly - 1 sets - 5 reps - 20 sec hold - Hooklying Clamshell with Resistance  - 1 x daily - 7 x weekly - 3 sets - 10 reps - Clamshell with Resistance  - 1 x daily - 7 x weekly - 3 sets - 10 reps - Standing Hamstring Stretch on Chair  - 1 x daily - 7 x weekly - 1 sets - 5 reps - 20 sec hold - Quadricep Stretch with Chair and Counter Support  - 1 x daily - 7 x weekly - 1 sets - 5 reps - 20 sec hold - Prone Quadriceps Stretch with Strap  - 1 x daily - 7 x weekly - 1 sets - 10 reps - 10 sec hold - Prone Hip Extension  - 1 x daily - 7 x weekly - 2 sets - 10 reps ASSESSMENT:  CLINICAL IMPRESSION: Ashton continues to experience pain.  We focused on manual techniques today.  He has extremely tight IT band and lateral quad.  He responded well to rolling to IT band and manual lateral quad deep tissue work.  We initiated eccentric hip flexor strengthening to promote healing of the iliopsoas.  He reported decreased pain post treatment.  We discussed trying DN to the lateral quad next visit.  He states he is open to this.  He would benefit from hip mobility and stretching along with hip strengthening, gait training and balance training to allow him to be more functional and improve his quality of life.    OBJECTIVE IMPAIRMENTS: Abnormal gait, decreased activity tolerance, decreased balance, decreased mobility, difficulty walking, decreased ROM, decreased strength, hypomobility, increased fascial restrictions, increased muscle spasms, impaired flexibility, and pain.   ACTIVITY LIMITATIONS: carrying, lifting, bending, standing, squatting, stairs, transfers, bed mobility, bathing, toileting, dressing, hygiene/grooming, and caring for others  PARTICIPATION LIMITATIONS: meal prep, cleaning,  laundry, interpersonal relationship, driving, shopping, community activity, occupation,  yard work, and church  PERSONAL FACTORS: Fitness, Past/current experiences, and 1 comorbidity: stage 4 cancer are also affecting patient's functional outcome.   REHAB POTENTIAL: Fair due to stage 4 cancer (on hold from treatment)  CLINICAL DECISION MAKING: Evolving/moderate complexity  EVALUATION COMPLEXITY: Moderate   GOALS: Goals reviewed with patient? Yes  SHORT TERM GOALS: Target date: 07/03/2023  Independence with initial HEP Baseline: Goal status: MET  2.  Pain report to be no greater than 4/10  Baseline:  Goal status: INITIAL   LONG TERM GOALS: Target date: 07/31/2023   Patient to report pain no greater than 2/10  Baseline:  Goal status: INITIAL  2.  Patient to be independent with advanced HEP  Baseline:  Goal status: INITIAL  3.  Patient to be able to ambulate 100 feet without a.d. safely  Baseline:  Goal status: INITIAL  4.  Patient to be able to ascend and descend steps without pain or no greater than 2/10  Baseline:  Goal status: INITIAL  5.  Patient to be able to bend, stoop and squat with pain no greater than 2/10  Baseline:  Goal status: INITIAL  6.  Patient to be able to resume some level of exercise in pool or walking Baseline:  Goal status: INITIAL   PLAN:  PT FREQUENCY: 1-2x/week  PT DURATION: 8 weeks  PLANNED INTERVENTIONS: 97164- PT Re-evaluation, 97110-Therapeutic exercises, 97530- Therapeutic activity, [id number]- Neuromuscular re-education, 97535- Self Care, [id number]- Manual therapy, [id number]- Gait training, [phone]- Canalith repositioning, [id number]- Aquatic Therapy, 97016- Vasopneumatic device, [phone]- Ionotophoresis 4mg /ml Dexamethasone , Patient/Family education, Balance training, Stair training, Taping, Dry Needling, Vestibular training, Visual/preceptual remediation/compensation, DME instructions, Cryotherapy, and Moist heat  PLAN FOR NEXT SESSION: Nustep for hip mobility, DN to left lateral quad,  progress LE strengthening and gait training.    Patient did not return after last visit,  DC Kymir Coles B. Oryon Gary, PT 06/03/24 8:40 AM  Delon B. Myrtis Maille, PT 06/18/23 10:07 PM [facility] [address] [place], [place] [address] Phone # [phone] Fax [phone]

## 2023-06-20 ENCOUNTER — Encounter: Payer: Medicare Other | Admitting: Physical Therapy

## 2023-06-25 ENCOUNTER — Encounter: Payer: Medicare Other | Admitting: Physical Therapy

## 2023-07-04 ENCOUNTER — Encounter: Payer: Medicare Other | Admitting: Physical Therapy

## 2023-07-09 ENCOUNTER — Encounter: Payer: Medicare Other | Admitting: Physical Therapy

## 2023-07-11 ENCOUNTER — Encounter: Payer: Medicare Other | Admitting: Physical Therapy

## 2023-11-09 ENCOUNTER — Encounter (HOSPITAL_BASED_OUTPATIENT_CLINIC_OR_DEPARTMENT_OTHER): Payer: Self-pay | Admitting: Emergency Medicine

## 2023-11-09 ENCOUNTER — Emergency Department (HOSPITAL_BASED_OUTPATIENT_CLINIC_OR_DEPARTMENT_OTHER)
Admission: EM | Admit: 2023-11-09 | Discharge: 2023-11-09 | Disposition: A | Discharge status: Home / Self Care | Attending: Emergency Medicine | Admitting: Emergency Medicine

## 2023-11-09 ENCOUNTER — Emergency Department (HOSPITAL_BASED_OUTPATIENT_CLINIC_OR_DEPARTMENT_OTHER): Admitting: Radiology

## 2023-11-09 DIAGNOSIS — S7002XA Contusion of left hip, initial encounter: Secondary | ICD-10-CM | POA: Insufficient documentation

## 2023-11-09 DIAGNOSIS — Z96642 Presence of left artificial hip joint: Secondary | ICD-10-CM | POA: Insufficient documentation

## 2023-11-09 DIAGNOSIS — W010XXA Fall on same level from slipping, tripping and stumbling without subsequent striking against object, initial encounter: Secondary | ICD-10-CM | POA: Diagnosis not present

## 2023-11-09 DIAGNOSIS — S79912A Unspecified injury of left hip, initial encounter: Secondary | ICD-10-CM | POA: Diagnosis present

## 2023-11-09 DIAGNOSIS — Z7982 Long term (current) use of aspirin: Secondary | ICD-10-CM | POA: Insufficient documentation

## 2023-11-09 LAB — CBC WITH DIFFERENTIAL/PLATELET
Abs Immature Granulocytes: 0.04 10*3/uL (ref 0.00–0.07)
Basophils Absolute: 0.1 10*3/uL (ref 0.0–0.1)
Basophils Relative: 1 %
Eosinophils Absolute: 0 10*3/uL (ref 0.0–0.5)
Eosinophils Relative: 0 %
HCT: 38.5 % — ABNORMAL LOW (ref 39.0–52.0)
Hemoglobin: 12 g/dL — ABNORMAL LOW (ref 13.0–17.0)
Immature Granulocytes: 0 %
Lymphocytes Relative: 10 %
Lymphs Abs: 1.1 10*3/uL (ref 0.7–4.0)
MCH: 25.2 pg — ABNORMAL LOW (ref 26.0–34.0)
MCHC: 31.2 g/dL (ref 30.0–36.0)
MCV: 80.9 fL (ref 80.0–100.0)
Monocytes Absolute: 0.7 10*3/uL (ref 0.1–1.0)
Monocytes Relative: 6 %
Neutro Abs: 9.2 10*3/uL — ABNORMAL HIGH (ref 1.7–7.7)
Neutrophils Relative %: 83 %
Platelets: 282 10*3/uL (ref 150–400)
RBC: 4.76 MIL/uL (ref 4.22–5.81)
RDW: 16.4 % — ABNORMAL HIGH (ref 11.5–15.5)
WBC: 11.1 10*3/uL — ABNORMAL HIGH (ref 4.0–10.5)
nRBC: 0 % (ref 0.0–0.2)

## 2023-11-09 LAB — BASIC METABOLIC PANEL
Anion gap: 4 — ABNORMAL LOW (ref 5–15)
BUN: 26 mg/dL — ABNORMAL HIGH (ref 8–23)
CO2: 35 mmol/L — ABNORMAL HIGH (ref 22–32)
Calcium: 9.6 mg/dL (ref 8.9–10.3)
Chloride: 100 mmol/L (ref 98–111)
Creatinine, Ser: 1.41 mg/dL — ABNORMAL HIGH (ref 0.61–1.24)
GFR, Estimated: 55 mL/min — ABNORMAL LOW (ref >60)
Glucose, Bld: 93 mg/dL (ref 70–99)
Potassium: 3.7 mmol/L (ref 3.5–5.1)
Sodium: 139 mmol/L (ref 135–145)

## 2023-11-09 NOTE — ED Triage Notes (Signed)
 Pt c/o LT hip pain after fall today at ~ 1400. Reports dizziness prior to fall. Hip replacement x 6 months pta. Swelling noted. Denies dizziness in triage, speech clear, no facial droop  noted, bilaterally equal. Denies thinners

## 2023-11-09 NOTE — Discharge Instructions (Addendum)
 Managing pain, stiffness, and swelling  Use resting, icing, applying pressure (compression), and raising (elevating) the injured area. This is often called the RICE method. Rest the injured area. Return to your normal activities as told by your health care provider. Ask your health care provider what activities are safe for you. If directed, put ice on the injured area. To do this: Put ice in a plastic bag. Place a towel between your skin and the bag. Leave the ice on for 20 minutes, 2-3 times a day. If your skin turns bright red, remove the ice right away to prevent skin damage. The risk of skin damage is higher if you cannot feel pain, heat, or cold. If directed, apply light compression to the injured area using an elastic bandage. Make sure the bandage is not wrapped too tightly. Remove and reapply the bandage as directed by your health care provider. If possible, elevate the injured area above the level of your heart while you are sitting or lying down. General instructions Take over-the-counter and prescription medicines only as told by your health care provider. Keep all follow-up visits. Your health care provider may want to see how your contusion is healing with treatment. Contact a health care provider if: Your symptoms do not improve after several days of treatment. Your symptoms get worse. You have difficulty moving the injured area. Get help right away if: You have severe pain. You have numbness in a hand or foot. Your hand or foot turns pale or cold.

## 2023-11-09 NOTE — ED Provider Notes (Signed)
 [facility] EMERGENCY DEPARTMENT AT [facility] Provider Note   CSN: [id number] Arrival date & time: 11/09/23  1638     History  Chief Complaint  Patient presents with   Marletta Lor    Jeffery Cobb is a 68 y.o. male who is status post left total hip arthroplasty who presents emergency department chief complaint of left hip injury.  Patient was walking at the park when he tripped and fell directly onto the left hip.  Patient developed a large bruise over the left hip rapidly.  He has a rollator which she was able to ambulate with.  He denies any new weakness or numbness in the left lower extremity.   Fall       Home Medications Prior to Admission medications   Medication Sig Start Date End Date Taking? Authorizing Provider  aspirin EC 81 MG tablet Take 1 tablet (81 mg total) by mouth daily. Swallow whole. 07/24/22   Lyn Records, MD  lurasidone (LATUDA) 40 MG TABS tablet Take 40 mg by mouth daily at 6 (six) AM. 03/20/22   [provider]  oxyCODONE (ROXICODONE) 5 MG immediate release tablet Take 1 tablet (5 mg total) by mouth every 6 (six) hours as needed for up to 10 doses. 05/17/23   Curatolo, Adam, DO  predniSONE (DELTASONE) 5 MG tablet Take 5 mg by mouth daily at 6 (six) AM. 06/28/22   [provider]  rosuvastatin (CRESTOR) 5 MG tablet Take 5 mg by mouth at bedtime.    [provider]  testosterone cypionate (DEPOTESTOTERONE CYPIONATE) 100 MG/ML injection Inject 200 mg into the muscle every 14 (fourteen) days. For IM use only    [provider]      Allergies    Triamcinolone acetonide    Review of Systems   Review of Systems  Physical Exam Updated Vital Signs BP 133/83 (BP Location: Right Arm)   Pulse (!) 105   Temp 97.9 F (36.6 C)   Resp 20   SpO2 98%  Physical Exam Vitals and nursing note reviewed.  Constitutional:      General: He is not in acute distress.    Appearance: He is well-developed. He is not diaphoretic.   HENT:     Head: Normocephalic and atraumatic.  Eyes:     General: No scleral icterus.    Conjunctiva/sclera: Conjunctivae normal.  Cardiovascular:     Rate and Rhythm: Normal rate and regular rhythm.     Heart sounds: Normal heart sounds.  Pulmonary:     Effort: Pulmonary effort is normal. No respiratory distress.     Breath sounds: Normal breath sounds.  Abdominal:     Palpations: Abdomen is soft.     Tenderness: There is no abdominal tenderness.  Musculoskeletal:        General: Normal range of motion.     Cervical back: Normal range of motion and neck supple.     Comments: Large hematoma over the left Hip. Normal passive/ active rom  Skin:    General: Skin is warm and dry.  Neurological:     Mental Status: He is alert.  Psychiatric:        Behavior: Behavior normal.     ED Results / Procedures / Treatments   Labs (all labs ordered are listed, but only abnormal results are displayed) Labs Reviewed  BASIC METABOLIC PANEL  CBC WITH DIFFERENTIAL/PLATELET    EKG None  Radiology No results found.  Procedures Procedures    Medications Ordered in  ED Medications - No data to display  ED Course/ Medical Decision Making/ A&P                                 Medical Decision Making Amount and/or Complexity of Data Reviewed Labs: ordered. Radiology: ordered.    Patient here after mechanical fall. I reviewed op records including previous oncology notes and radiologic impressions from [facility] through [facility]. Patient has a known Periprosthetic Left Hip fracture  I visualized and interpreted L hip xray. Image again demonstrates an old periprosthetic hip frx with callous formation. No New frx. Patient is ambulatory D/C with supportive care.         Final Clinical Impression(s) / ED Diagnoses Final diagnoses:  Hematoma of hip, left, initial encounter    Rx / DC Orders ED Discharge Orders     None         Arthor Captain, PA-C 11/09/23  2001    [address], [place] [employer], DO 11/09/23 2030

## 2023-11-13 ENCOUNTER — Other Ambulatory Visit (HOSPITAL_COMMUNITY): Payer: Self-pay | Admitting: Family Medicine

## 2023-11-13 DIAGNOSIS — C7931 Secondary malignant neoplasm of brain: Secondary | ICD-10-CM

## 2023-11-14 ENCOUNTER — Ambulatory Visit (HOSPITAL_COMMUNITY)
Admission: RE | Admit: 2023-11-14 | Discharge: 2023-11-14 | Disposition: A | Discharge status: Home / Self Care | Source: Ambulatory Visit | Attending: Family Medicine | Admitting: Family Medicine

## 2023-11-14 DIAGNOSIS — C7931 Secondary malignant neoplasm of brain: Secondary | ICD-10-CM | POA: Insufficient documentation

## 2023-12-26 ENCOUNTER — Ambulatory Visit: Attending: Radiation Oncology

## 2023-12-26 DIAGNOSIS — R262 Difficulty in walking, not elsewhere classified: Secondary | ICD-10-CM | POA: Diagnosis present

## 2023-12-26 DIAGNOSIS — R2681 Unsteadiness on feet: Secondary | ICD-10-CM | POA: Insufficient documentation

## 2023-12-26 DIAGNOSIS — M6281 Muscle weakness (generalized): Secondary | ICD-10-CM | POA: Insufficient documentation

## 2023-12-26 NOTE — Therapy (Signed)
 OUTPATIENT PHYSICAL THERAPY NEURO EVALUATION   Patient Name: Jeffery Cobb MRN: [id number] DOB:02/13/1956, 68 y.o., male Today's Date: 12/26/2023   PCP: Lanae Pinal, MD REFERRING PROVIDER: Cristy Doom, NP  END OF SESSION:  PT End of Session - 12/26/23 0935     Visit Number 1    Number of Visits 5    Date for PT Re-Evaluation 01/23/24    Authorization Type Medicare    PT Start Time 0935    PT Stop Time 1015    PT Time Calculation (min) 40 min             Past Medical History:  Diagnosis Date   Anxiety    Bipolar affective disorder (HCC)    CAD (coronary artery disease)    Cancer (HCC)    lung   Depression    Depression    Hypercholesterolemia    Hypopituitarism (HCC)    Hypothyroidism (acquired)    Malignant neoplasm of right lung, unspecified part of lung (HCC)    Past Surgical History:  Procedure Laterality Date   JOINT REPLACEMENT Left 6.20/2018   hip   Patient Active Problem List   Diagnosis Date Noted   SAH (subarachnoid hemorrhage) (HCC) 12/29/2017   Pancolitis (HCC) 03/29/2017   C. 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   REFERRING DIAG: C79.31 (ICD-10-CM) - Secondary malignant neoplasm of brain R27.0 (ICD-10-CM) - Ataxia, unspecified I61.2 (ICD-10-CM) - Nontraumatic intracerebral hemorrhage in hemisphere, unspecified  THERAPY DIAG:  No diagnosis found.  Rationale for Evaluation and Treatment: Rehabilitation  SUBJECTIVE:                                                                                                                                                                                              SUBJECTIVE STATEMENT: Over the past year having increased balance issues even when walking with walker. Has weakness affecting LLE > RLE. 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  Has ongoing hx of mets to brain with no active treatment at present and under goes monitoring Q 6 months. Notes that it seems like  left knee is buckling and results in falls.  He notes walking on hills has been problematic and has since been walking level ground with walker for exercise and other days enjoys swimming.    Pt accompanied by: self  PERTINENT HISTORY: left THR  PAIN:  Are you having pain? Yes: NPRS scale: 6/10 Pain location: right low back Pain description: varies Aggravating factors: unknown, more discomfort in AM Relieving factors: movement, change of position  PRECAUTIONS: Fall  RED FLAGS: None   WEIGHT BEARING RESTRICTIONS: No  FALLS: Has patient fallen in last 6 months? Yes. Number of falls 4  LIVING ENVIRONMENT: Lives with: lives alone Lives in: House/apartment Stairs: 1 step to enter Has following equipment at home: Walker - 4 wheeled  PLOF: Independent  PATIENT GOALS: improve balance  OBJECTIVE:  Note: Objective measures were completed at Evaluation unless otherwise noted.  DIAGNOSTIC FINDINGS:   COGNITION: Overall cognitive status: Within functional limits for tasks assessed   SENSATION: WFL  COORDINATION: Difficulty rapid alternating w/ left ankle  EDEMA:    MUSCLE TONE: NT  MUSCLE LENGTH: WNL    POSTURE: No Significant postural limitations  LOWER EXTREMITY ROM:     WNL  LOWER EXTREMITY MMT:    MMT Right Eval Left Eval  Hip flexion 5 4  Hip extension 5 3+  Hip abduction 5 3  Hip adduction 5 4  Hip internal rotation    Hip external  rotation    Knee flexion 5 3+  Knee extension 5 3+  Ankle dorsiflexion 5 3  Ankle plantarflexion 5 3  Ankle inversion    Ankle eversion    (Blank rows = not tested)  BED MOBILITY:  indep  TRANSFERS: indep  RAMP:  indep  CURB:  indep  STAIRS: Modified indep GAIT: Findings: Gait Characteristics: during FGA exhibits greater degree of LLE ataxia under different sensory/divided attention demands, decreased ankle dorsiflexion- Left, and circumduction- Left and Comments: SBA uneven surfaces  FUNCTIONAL TESTS:  10 meter walk test: 13.5 sec = 2.4 ft/sec Functional gait assessment: 22/30  M-CTSIB  Condition 1: Firm Surface, EO 30 Sec, Normal and Mild Sway  Condition 2: Firm Surface, EC 30 Sec, Mild and Moderate Sway  Condition 3: Foam Surface, EO 30 Sec, Mild Sway  Condition 4: Foam Surface, EC 30 Sec, Moderate and Severe Sway    [facility] PT Assessment - 12/26/23 0001       Standardized Balance Assessment   Standardized Balance Assessment 10 meter walk test    10 Meter Walk 13.5      Functional Gait  Assessment   Gait assessed  Yes    Gait Level Surface Walks 20 ft in less than 7 sec but greater than 5.5 sec, uses assistive device, slower speed, mild gait deviations, or deviates 6-10 in outside of the 12 in walkway width.    Change in Gait Speed Able to smoothly change walking speed without loss of balance or gait deviation. Deviate no more than 6 in outside of the 12 in walkway width.    Gait with Horizontal Head Turns Performs head turns smoothly with slight change in gait velocity (eg, minor disruption to smooth gait path), deviates 6-10 in outside 12 in walkway width, or uses an assistive device.    Gait with Vertical Head Turns Performs task with slight change in gait velocity (eg, minor disruption to smooth gait path), deviates 6 - 10 in outside 12 in walkway width or uses assistive device    Gait and  Pivot Turn Pivot turns safely within 3 sec and stops quickly with no loss  of balance.    Step Over Obstacle Is able to step over 2 stacked shoe boxes taped together (9 in total height) without changing gait speed. No evidence of imbalance.    Gait with Narrow Base of Support Ambulates 7-9 steps.    Gait with Eyes Closed Walks 20 ft, slow speed, abnormal gait pattern, evidence for imbalance, deviates 10-15 in outside 12 in walkway width. Requires more than 9 sec to ambulate 20 ft.    Ambulating Backwards Walks 20 ft, uses assistive device, slower speed, mild gait deviations, deviates 6-10 in outside 12 in walkway width.    Steps Alternating feet, must use rail.    Total Score 22              PATIENT SURVEYS:                                                                                                                                TREATMENT DATE: 12/26/23    PATIENT EDUCATION: Education details: assessment details, HEP initiation, topic of future sessions Person educated: Patient Education method: Explanation and Handouts Education comprehension: verbalized understanding and needs further education  HOME EXERCISE PROGRAM: Access Code: [email or web address] URL: [email or web address] Date: 12/26/2023 Prepared by: Kelly Annalynn Centanni  Exercises - Corner Balance Feet Together With Eyes Closed  - 1 x daily - 7 x weekly - 3 sets - 30 sec hold - Corner Balance Feet Together: Eyes Open With Head Turns  - 1 x daily - 7 x weekly - 3 sets - 3 reps - Corner Balance Feet Together: Eyes Closed With Head Turns  - 1 x daily - 7 x weekly - 3 sets - 3 reps - Tandem Stance in Corner  - 1 x daily - 7 x weekly - 3 sets - 30 sec hold  GOALS: Goals reviewed with patient? Yes  SHORT TERM GOALS: Target date: same as LTG    LONG TERM GOALS: Target date: 01/24/2024    Patient will be independent in HEP to improve functional outcomes Baseline:  Goal status: INITIAL  2.  Demo improved postural stability per mild sway x 30 sec condition 4 M-CTSIB for improved safety in  ADL Baseline: mod-severe x 30 sec Goal status: INITIAL  3.  Increase left hip extension/abduction strength to 4/5 for improved stance control during gait Baseline: see above Goal status: INITIAL  4.  Report/demo improved balance and stability per report of ability to ambulate hills/uneven surfaces w/ rollator Baseline: level surfaces only Goal status: INITIAL  5.  Demo improved balance/motor control/reduced risk for falls per score 25/30 Functional Gait Assessment Baseline: 22/30 Goal status: INITIAL    ASSESSMENT:  CLINICAL IMPRESSION: Patient is a 68 y.o. male who was seen today for physical therapy evaluation and treatment for gait abnormality.  Exhibits weakness affecting LLE with gait deviations limiting stance and swing phase  of ambulation with increased risk for falls evident by score 22/30 FGA and reduced gait speed overall.  Decreased postural control and proprioceptive awareness evident by M-CTSIB.  Pt would benefit from PT services to develop and instruct in relevant intervention, compensatory, and adaptive strategies to improve safety with mobility and reduce risk for falls to enable return to typical level of activities.   OBJECTIVE IMPAIRMENTS: Abnormal gait, decreased balance, decreased mobility, difficulty walking, and decreased strength.   ACTIVITY LIMITATIONS: carrying, lifting, stairs, and locomotion level  PARTICIPATION LIMITATIONS: meal prep, cleaning, laundry, community activity, and walking outdoors  PERSONAL FACTORS: Age, Time since onset of injury/illness/exacerbation, and 1-2 comorbidities: PMH are also affecting patient's functional outcome.   REHAB POTENTIAL: Good  CLINICAL DECISION MAKING: Evolving/moderate complexity  EVALUATION COMPLEXITY: Moderate  PLAN:  PT FREQUENCY: 1x/week  PT DURATION: 4 weeks  PLANNED INTERVENTIONS: 97750- Physical Performance Testing, 97110-Therapeutic exercises, 97530- Therapeutic activity, [id number]- Neuromuscular  re-education, 97535- Self Care, 16109- Manual therapy, [id number]- Gait training, [phone]- Orthotic Initial, [phone]- Orthotic/Prosthetic subsequent, and [phone]- Aquatic Therapy  PLAN FOR NEXT SESSION: gait w/ foot up on left, floor exercises for LLE strength hip>knee   Angie Bari, PT 12/26/2023, 10:38 AM

## 2023-12-27 ENCOUNTER — Other Ambulatory Visit: Payer: Self-pay

## 2023-12-31 ENCOUNTER — Ambulatory Visit

## 2023-12-31 DIAGNOSIS — M6281 Muscle weakness (generalized): Secondary | ICD-10-CM

## 2023-12-31 DIAGNOSIS — R262 Difficulty in walking, not elsewhere classified: Secondary | ICD-10-CM

## 2023-12-31 DIAGNOSIS — R2681 Unsteadiness on feet: Secondary | ICD-10-CM

## 2023-12-31 NOTE — Therapy (Signed)
 OUTPATIENT PHYSICAL THERAPY NEURO TREATMENT   Patient Name: Jeffery Cobb MRN: [id number] DOB:08/17/1956, 68 y.o., male Today's Date: 12/31/2023   PCP: Lanae Pinal, MD REFERRING PROVIDER: Cristy Doom, NP  END OF SESSION:  PT End of Session - 12/31/23 1618     Visit Number 2    Number of Visits 5    Date for PT Re-Evaluation 01/23/24    Authorization Type Medicare    PT Start Time 1618    PT Stop Time 1700    PT Time Calculation (min) 42 min             Past Medical History:  Diagnosis Date   Anxiety    Bipolar affective disorder (HCC)    CAD (coronary artery disease)    Cancer (HCC)    lung   Depression    Depression    Hypercholesterolemia    Hypopituitarism (HCC)    Hypothyroidism (acquired)    Malignant neoplasm of right lung, unspecified part of lung (HCC)    Past Surgical History:  Procedure Laterality Date   JOINT REPLACEMENT Left 6.20/2018   hip   Patient Active Problem List   Diagnosis Date Noted   SAH (subarachnoid hemorrhage) (HCC) 12/29/2017   Pancolitis (HCC) 03/29/2017   C. 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   REFERRING DIAG: C79.31 (ICD-10-CM) - Secondary malignant neoplasm of brain R27.0 (ICD-10-CM) - Ataxia, unspecified I61.2 (ICD-10-CM) - Nontraumatic intracerebral hemorrhage in hemisphere, unspecified  THERAPY DIAG:  Difficulty in walking, not elsewhere classified  Muscle weakness (generalized)  Unsteadiness on feet  Rationale for  Evaluation and Treatment: Rehabilitation  SUBJECTIVE:                                                                                                                                                                                             SUBJECTIVE STATEMENT: Doing ok Pt accompanied by: self  PERTINENT HISTORY: left THR  PAIN:  Are you having pain? Yes: NPRS scale: 6/10 Pain location: right low back Pain description: varies Aggravating factors: unknown, more discomfort in AM Relieving factors: movement, change of position  PRECAUTIONS: Fall  RED FLAGS: None  WEIGHT BEARING RESTRICTIONS: No  FALLS: Has patient fallen in last 6 months? Yes. Number of falls 4  LIVING ENVIRONMENT: Lives with: lives alone Lives in: House/apartment Stairs: 1 step to enter Has following equipment at home: Walker - 4 wheeled  PLOF: Independent  PATIENT GOALS: improve balance  OBJECTIVE:   TODAY'S TREATMENT: 12/31/23 Activity Comments  Gait training Trials w/ foot-up Trials with ankle weights  Sidelying hip abd 2x10   Hip extension quadruped on forearms 2x10   Bridge 1x10 Ankle PF/DF  Corner balance        Note: Objective measures were completed at Evaluation unless otherwise noted.  DIAGNOSTIC FINDINGS:   COGNITION: Overall cognitive status: Within functional limits for tasks assessed   SENSATION: WFL  COORDINATION: Difficulty rapid alternating w/ left ankle  EDEMA:    MUSCLE TONE: NT  MUSCLE LENGTH: WNL    POSTURE: No Significant postural limitations  LOWER EXTREMITY ROM:     WNL  LOWER EXTREMITY MMT:    MMT Right Eval Left Eval  Hip flexion 5 4  Hip extension 5 3+  Hip abduction 5 3  Hip adduction 5 4  Hip internal rotation    Hip external rotation    Knee flexion 5 3+  Knee extension 5 3+  Ankle dorsiflexion 5 3  Ankle plantarflexion 5 3  Ankle inversion    Ankle eversion    (Blank rows = not tested)  BED MOBILITY:   indep  TRANSFERS: indep  RAMP:  indep  CURB:  indep  STAIRS: Modified indep GAIT: Findings: Gait Characteristics: during FGA exhibits greater degree of LLE ataxia under different sensory/divided attention demands, decreased ankle dorsiflexion- Left, and circumduction- Left and Comments: SBA uneven surfaces  FUNCTIONAL TESTS:  10 meter walk test: 13.5 sec = 2.4 ft/sec Functional gait assessment: 22/30  M-CTSIB  Condition 1: Firm Surface, EO 30 Sec, Normal and Mild Sway  Condition 2: Firm Surface, EC 30 Sec, Mild and Moderate Sway  Condition 3: Foam Surface, EO 30 Sec, Mild Sway  Condition 4: Foam Surface, EC 30 Sec, Moderate and Severe Sway       PATIENT SURVEYS:                                                                                                                                TREATMENT DATE: 12/26/23    PATIENT EDUCATION: Education details: assessment details, HEP initiation, topic of future sessions Person educated: Patient Education method: Explanation and Handouts Education comprehension: verbalized understanding and needs further education  HOME EXERCISE PROGRAM: Access Code: [email or web address] URL: [email or web address] Date: 12/26/2023 Prepared by: Kelly Charli Liberatore  Exercises - Corner Balance Feet Together With Eyes Closed  - 1 x daily - 7 x weekly - 3 sets - 30 sec hold - Corner Balance Feet Together: Eyes Open With Head Turns  - 1 x daily - 7 x weekly - 3 sets - 3 reps - Corner Balance Feet Together: Eyes  Closed With Head Turns  - 1 x daily - 7 x weekly - 3 sets - 3 reps - Tandem Stance in Corner  - 1 x daily - 7 x weekly - 3 sets - 30 sec hold  Access Code: [email or web address] URL: [email or web address] Date: 12/31/2023 Prepared by: Kelly Dnasia Gauna  Exercises - Sidelying Hip Abduction  - 1 x daily - 2-3 x weekly - 3 sets - 10 reps - Quadruped on Forearms Hip Extension  - 1 x daily - 2-3 x weekly - 3 sets - 10 reps - Supine Bridge  - 1 x  daily - 7 x weekly - 1-3 sets - 10 reps - Bridge on Heels  - 1 x daily - 7 x weekly - 1-3 sets - 10 reps  GOALS: Goals reviewed with patient? Yes  SHORT TERM GOALS: Target date: same as LTG    LONG TERM GOALS: Target date: 01/24/2024    Patient will be independent in HEP to improve functional outcomes Baseline:  Goal status: INITIAL  2.  Demo improved postural stability per mild sway x 30 sec condition 4 M-CTSIB for improved safety in ADL Baseline: mod-severe x 30 sec Goal status: INITIAL  3.  Increase left hip extension/abduction strength to 4/5 for improved stance control during gait Baseline: see above Goal status: INITIAL  4.  Report/demo improved balance and stability per report of ability to ambulate hills/uneven surfaces w/ rollator Baseline: level surfaces only Goal status: INITIAL  5.  Demo improved balance/motor control/reduced risk for falls per score 25/30 Functional Gait Assessment Baseline: 22/30 Goal status: INITIAL    ASSESSMENT:  CLINICAL IMPRESSION: Gait trials to improve left foot clearance and swing control with best performance using foot-up device. Trials with light ankle weight to LLE but without improved swing phase control and decreased left foot clearance.  Instruction in floor exercises for improving LLE strength with HEP development with emphasis on hip strength to improve proximal strength/stability. Continued sessions to progress POC details to improve safety with walking outdoors/uneven surfaces.   OBJECTIVE IMPAIRMENTS: Abnormal gait, decreased balance, decreased mobility, difficulty walking, and decreased strength.   ACTIVITY LIMITATIONS: carrying, lifting, stairs, and locomotion level  PARTICIPATION LIMITATIONS: meal prep, cleaning, laundry, community activity, and walking outdoors  PERSONAL FACTORS: Age, Time since onset of injury/illness/exacerbation, and 1-2 comorbidities: PMH are also affecting patient's functional outcome.   REHAB  POTENTIAL: Good  CLINICAL DECISION MAKING: Evolving/moderate complexity  EVALUATION COMPLEXITY: Moderate  PLAN:  PT FREQUENCY: 1x/week  PT DURATION: 4 weeks  PLANNED INTERVENTIONS: 97750- Physical Performance Testing, 97110-Therapeutic exercises, 97530- Therapeutic activity, [id number]- Neuromuscular re-education, 97535- Self Care, 95621- Manual therapy, [phone]- Gait training, [phone]- Orthotic Initial, [phone]- Orthotic/Prosthetic subsequent, and [phone]- Aquatic Therapy  PLAN FOR NEXT SESSION: gait w/ foot up on left--he is bringing his walking shoes to try out the foot up--walk outdoors up/down hills, etc, progress strength/balance HEP   Angie Bari, PT 12/31/2023, 4:19 PM

## 2024-01-09 ENCOUNTER — Ambulatory Visit

## 2024-01-17 ENCOUNTER — Ambulatory Visit: Admitting: Physical Therapy

## 2024-01-17 ENCOUNTER — Telehealth: Payer: Self-pay | Admitting: Physical Therapy

## 2024-01-17 NOTE — Therapy (Incomplete)
 OUTPATIENT PHYSICAL THERAPY NEURO TREATMENT   Patient Name: Jeffery Cobb MRN: [id number] DOB:1955/11/22, 68 y.o., male Today's Date: 01/17/2024   PCP: Lanae Pinal, MD REFERRING PROVIDER: Cristy Doom, NP  END OF SESSION:    Past Medical History:  Diagnosis Date   Anxiety    Bipolar affective disorder (HCC)    CAD (coronary artery disease)    Cancer (HCC)    lung   Depression    Depression    Hypercholesterolemia    Hypopituitarism (HCC)    Hypothyroidism (acquired)    Malignant neoplasm of right lung, unspecified part of lung (HCC)    Past Surgical History:  Procedure Laterality Date   JOINT REPLACEMENT Left 6.20/2018   hip   Patient Active Problem List   Diagnosis Date Noted   SAH (subarachnoid hemorrhage) (HCC) 12/29/2017   Pancolitis (HCC) 03/29/2017   C. 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   REFERRING DIAG: C79.31 (ICD-10-CM) - Secondary malignant neoplasm of brain R27.0 (ICD-10-CM) - Ataxia, unspecified I61.2 (ICD-10-CM) - Nontraumatic intracerebral hemorrhage in hemisphere, unspecified  THERAPY DIAG:  No diagnosis found.  Rationale for Evaluation and Treatment: Rehabilitation  SUBJECTIVE:                                                                                                                                                                                             SUBJECTIVE STATEMENT: ***Doing ok Pt accompanied by: self  PERTINENT HISTORY: left  THR  PAIN:  Are you having pain? Yes: NPRS scale: 6/10 Pain location: right low back Pain description: varies Aggravating factors: unknown, more discomfort in AM Relieving factors: movement, change of position  PRECAUTIONS: Fall  RED FLAGS: None   WEIGHT BEARING RESTRICTIONS: No  FALLS: Has patient fallen in last 6 months? Yes. Number of falls 4  LIVING ENVIRONMENT: Lives with: lives alone Lives in: House/apartment Stairs: 1 step to enter Has following equipment at home: Otho Blitz - 4 wheeled  PLOF: Independent  PATIENT GOALS: improve balance  OBJECTIVE:    TODAY'S TREATMENT: 01/17/2024 Activity Comments  TODAY'S TREATMENT: 12/31/23 Activity Comments  Gait training Trials w/ foot-up Trials with ankle weights  Sidelying hip abd 2x10   Hip extension quadruped on forearms 2x10   Bridge 1x10 Ankle PF/DF  Corner balance        Note: Objective measures were completed at Evaluation unless otherwise noted.  DIAGNOSTIC FINDINGS:   COGNITION: Overall cognitive status: Within functional limits for tasks assessed   SENSATION: WFL  COORDINATION: Difficulty rapid alternating w/ left ankle  EDEMA:    MUSCLE TONE: NT  MUSCLE LENGTH: WNL    POSTURE: No Significant postural limitations  LOWER EXTREMITY ROM:     WNL  LOWER EXTREMITY MMT:    MMT Right Eval Left Eval  Hip flexion 5 4  Hip extension 5 3+  Hip abduction 5 3  Hip adduction 5 4  Hip internal rotation    Hip external rotation    Knee flexion 5 3+  Knee extension 5 3+  Ankle dorsiflexion 5 3  Ankle plantarflexion 5 3  Ankle inversion    Ankle eversion    (Blank rows = not tested)  BED MOBILITY:  indep  TRANSFERS: indep  RAMP:  indep  CURB:  indep  STAIRS: Modified indep GAIT: Findings: Gait Characteristics: during FGA exhibits greater degree of LLE ataxia under different sensory/divided attention demands, decreased ankle dorsiflexion- Left, and  circumduction- Left and Comments: SBA uneven surfaces  FUNCTIONAL TESTS:  10 meter walk test: 13.5 sec = 2.4 ft/sec Functional gait assessment: 22/30  M-CTSIB  Condition 1: Firm Surface, EO 30 Sec, Normal and Mild Sway  Condition 2: Firm Surface, EC 30 Sec, Mild and Moderate Sway  Condition 3: Foam Surface, EO 30 Sec, Mild Sway  Condition 4: Foam Surface, EC 30 Sec, Moderate and Severe Sway       PATIENT SURVEYS:                                                                                                                                TREATMENT DATE: 12/26/23    PATIENT EDUCATION: Education details: assessment details, HEP initiation, topic of future sessions Person educated: Patient Education method: Explanation and Handouts Education comprehension: verbalized understanding and needs further education  HOME EXERCISE PROGRAM: Access Code: [email or web address] URL: [email or web address] Date: 12/26/2023 Prepared by: Kelly Halpin  Exercises - Corner Balance Feet Together With Eyes Closed  - 1 x daily - 7 x weekly - 3 sets - 30 sec hold - Corner Balance Feet Together: Eyes Open With Head Turns  - 1 x daily - 7 x weekly - 3 sets - 3 reps - Corner Balance Feet Together: Eyes Closed With Head Turns  - 1 x daily - 7 x weekly - 3 sets - 3 reps - Tandem Stance in Corner  - 1 x daily - 7 x weekly - 3 sets - 30 sec hold  Access Code: [email or web address] URL: [email or web address] Date: 12/31/2023 Prepared by: Kelly Halpin  Exercises -  Sidelying Hip Abduction  - 1 x daily - 2-3 x weekly - 3 sets - 10 reps - Quadruped on Forearms Hip Extension  - 1 x daily - 2-3 x weekly - 3 sets - 10 reps - Supine Bridge  - 1 x daily - 7 x weekly - 1-3 sets - 10 reps - Bridge on Heels  - 1 x daily - 7 x weekly - 1-3 sets - 10 reps  GOALS: Goals reviewed with patient? Yes  SHORT TERM GOALS: Target date: same as LTG    LONG TERM GOALS: Target date: 01/24/2024    Patient will be  independent in HEP to improve functional outcomes Baseline:  Goal status: INITIAL  2.  Demo improved postural stability per mild sway x 30 sec condition 4 M-CTSIB for improved safety in ADL Baseline: mod-severe x 30 sec Goal status: INITIAL  3.  Increase left hip extension/abduction strength to 4/5 for improved stance control during gait Baseline: see above Goal status: INITIAL  4.  Report/demo improved balance and stability per report of ability to ambulate hills/uneven surfaces w/ rollator Baseline: level surfaces only Goal status: INITIAL  5.  Demo improved balance/motor control/reduced risk for falls per score 25/30 Functional Gait Assessment Baseline: 22/30 Goal status: INITIAL    ASSESSMENT:  CLINICAL IMPRESSION: Pt presents today ***. Skilled PT session focused on ***. Pt needs ***. Pt will continue to benefit from skilled PT towards goals for improved functional mobility and decreased fall risk.   Gait trials to improve left foot clearance and swing control with best performance using foot-up device. Trials with light ankle weight to LLE but without improved swing phase control and decreased left foot clearance.  Instruction in floor exercises for improving LLE strength with HEP development with emphasis on hip strength to improve proximal strength/stability. Continued sessions to progress POC details to improve safety with walking outdoors/uneven surfaces.   OBJECTIVE IMPAIRMENTS: Abnormal gait, decreased balance, decreased mobility, difficulty walking, and decreased strength.   ACTIVITY LIMITATIONS: carrying, lifting, stairs, and locomotion level  PARTICIPATION LIMITATIONS: meal prep, cleaning, laundry, community activity, and walking outdoors  PERSONAL FACTORS: Age, Time since onset of injury/illness/exacerbation, and 1-2 comorbidities: PMH are also affecting patient's functional outcome.   REHAB POTENTIAL: Good  CLINICAL DECISION MAKING: Evolving/moderate  complexity  EVALUATION COMPLEXITY: Moderate  PLAN:  PT FREQUENCY: 1x/week  PT DURATION: 4 weeks  PLANNED INTERVENTIONS: 97750- Physical Performance Testing, 97110-Therapeutic exercises, 97530- Therapeutic activity, 97112- Neuromuscular re-education, 97535- Self Care, 91478- Manual therapy, [phone]- Gait training, [phone]- Orthotic Initial, [phone]- Orthotic/Prosthetic subsequent, and [phone]- Aquatic Therapy  PLAN FOR NEXT SESSION: ***gait w/ foot up on left--he is bringing his walking shoes to try out the foot up--walk outdoors up/down hills, etc, progress strength/balance HEP   Rage Beever W., PT 01/17/2024, 10:03 AM  [facility] [address] [place], [place] [address] Phone # [phone] Fax # [phone]

## 2024-01-17 NOTE — Telephone Encounter (Signed)
 Called and spoke with patient regarding missed appointment today.  He states he is in a great deal of back pain, having a hard time walking, and is trying to get into see MD.  Reminded patient of next scheduled appointment 01/23/2024 at 11:45 and requested pt call us  to cancel or reschedule if needed.  Dessie Flow, PT 01/17/24 11:17 AM Phone: [phone] Fax: [phone]

## 2024-01-23 ENCOUNTER — Ambulatory Visit: Attending: Radiation Oncology

## 2024-05-12 ENCOUNTER — Other Ambulatory Visit (HOSPITAL_BASED_OUTPATIENT_CLINIC_OR_DEPARTMENT_OTHER): Payer: Self-pay | Admitting: Family Medicine

## 2024-05-12 DIAGNOSIS — D497 Neoplasm of unspecified behavior of endocrine glands and other parts of nervous system: Secondary | ICD-10-CM

## 2024-06-15 ENCOUNTER — Other Ambulatory Visit: Payer: Self-pay

## 2024-06-15 DIAGNOSIS — R972 Elevated prostate specific antigen [PSA]: Secondary | ICD-10-CM

## 2024-06-19 ENCOUNTER — Other Ambulatory Visit (HOSPITAL_COMMUNITY): Payer: Self-pay

## 2024-06-19 DIAGNOSIS — R972 Elevated prostate specific antigen [PSA]: Secondary | ICD-10-CM

## 2024-06-26 ENCOUNTER — Other Ambulatory Visit: Payer: Self-pay | Admitting: Gastroenterology

## 2024-06-26 NOTE — Progress Notes (Signed)
 Lung Clinic Interval Note 06/26/2024  Chief Complaint: Follow-up Stage IV Lung Cancer.   History Present Illness: Lung cancer   Primary site: Lung   Staging method: AJCC 7th Edition   Clinical: Stage IV (T2a, NX, M1b) - Signed by Jeffery Slough, NP on 01/24/2013   Summary: Stage IV (T2a, NX, M1b)  ONCOLOGIC PROBLEM LIST: - Developed Back pain Dec 2013. - April 2014- chiropractic care, no improvement in pain - MRI spine - per patient report did not identify any spinal problems, but noted a left enlarged inguinal node.  - 01/01/2013, CT A/P noted two hepatic lesions within the right lobe, 4.7 x 5.9 x 5.6cm and 2.3 x 1.8 x 2.2cm. Bilateral adrenal glands enlarged. Left groin soft tissue mass appearing to be necrotic adenopathy     measuring 4.6 x 4.1 cm.  - 01/05/2013, CT of the chest Identified a 4 cm mass in the RLL and a 5 cm mass in the right lobe of the liver 01/09/13 Left inguinal node Core Bx, non-small cell carcinoma, favor poorly diff adenocarcinoma CK7 positive, TTF1 negative, DX of Lung primary.  01/10/13 Brain MRI- 4 subcm mets 01/16/13 PET/CT showed hypermetabolic RLL lung mass, Right lobe liver mets, bilateral adrenal mets, Left inguinal nodal mets, multiple bone mets with large foci in Left femur. 01/21/13- Prophylactic Left Femur rod & IMNailing 01/23/13: Multiple brain metastases SRS (frontal parasaggital cluster, right parietotemporal, left frontoparietal) 2000 cGy x 1 fraction, 01/23/13 01/26/13-01/30/13:  L hip/proxima thigh, AP/PA, 400cGy x 5 fractions 01/30/13: SRS brain radiation to lesions:  R parieto/temporal lesion, L parasaggital frontal lesion, R parasaggital frontal lesion, L frontoparietal lesion. 03/13/13: SRS L frontal lesion and R frontal lesion. 04/22/13- Right Femur reaming & nailing 05/14/13- new LUE weakness, L knee instability and L foot drop, MRI showed Right frontal brain met, SRS focused Radiation therapy.  04/03/13: He was randomized to Nivo 3mg /kg Q2w and Ipi 1mg /kg  Q6w. - 10/13/15 CT chest new RLL consolidation indeterminate. CT a/p NED. - 09/05/18 CT chest w/ stable RLL consolidation, no other evidence of met dz - 10/11/18 MRI brain with stable intracranial disease  Mutational Analysis: EGFR: Negative JOX:Wzhjupcz  ROS1: Not tested today KRAS: Not tested today  Staging: Stage IV  HISTORY OF PRESENT ILLNESS: Jeffery Cobb noted left lumbar back pain and left leg pain dating back to Dec 2013. His symptoms worsened, and he developed left leg weakness & falls. In April 2014, He was referred for an MRI spine, which did not identify a cause for his pain, but identified a left inguinal enlarged node. He was referred to [facility] for additional work-up. A left inquinal node core bx was diagnostic of a poorly diff non-small cell carcinoma, favoring Adenocarcinoma lung cancer. A brain MRI was notable for 4 subcm brain mets on 01/10/13. A PET/CT on 5/30 confirmed hypermetabolic masses in the RLL, R lobe of liver, Bilat adrenals, left inguinal node and bones. Specifically concerning was a large left femur metastasis. He was referred to both radiation oncology and orthopedic surgery. On 01/21/13, he had a Left femur rod and IMNailing. He required surgical stabilization of a left femur met, followed by palliative RT. He then completed SRS brain radiation for 4 supratentorial met lesions. Reimaging of the brain, noted new metastasis, for which he completed SRS of the two additional lesions in September.  He was randomized to Nivo 3mg /kg Q2w and Ipi 1mg /kg Q6w started 04/03/13. He had hip pain due to preexisting right hip met and had surgical prophylaxis 9/3.  He was also on zometa. The tumors in the left lung, liver, and other sites had dramatic shrinkage with immune therapy.  He had bilateral femur surgically stabilized and palliative RT. He has had SRS to multiple brain metastasis. In Aug 2014 he initiated BMS12 clinical trial with nivolumab 3mg /kg and ipilumumab 1mg /kg protocol  treatment on BMS12. Last treatment 08/30/14.    Interval History: JefferyJeffery Cobb is a 68 y.o. gentleman who presents for medical oncology follow-up with repeat CT chest imaging. 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   He has remained off treatment for several years, overall doing well. He has a hx of cancer treatment-related adrenal insufficiency and is on steroid replacement. He continues to follow with endocrinology. He has had follow-up with the [facility] team regarding emotional concerns.   He reports ataxia with falls, left foot drop, and fatigue. He was found to have a cerebellar area of hemorrhage on MRI. Jeffery Cobb has been seeing him in follow-up. A brain MRI recently, indicates the area of concern is improving. Jeffery Cobb has referred him for physical therapy. A repeat MRI and visit w/Jeffery Cobb showed improvement in imaging.  No change in breathing or new focal pains today.   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  Past Surgical History:  has a past surgical history that includes Oral Surgery Operative Note; IM nailing femoral shaft fracture (01/2013); excision bone cyst/tumor femur w/fixation (Right, 04/22/2013); and arthroplasty hip total (Left, 02/06/2017).  Family History: family history includes Breast cancer in his sister; Lung cancer in his mother.  Social History:  Social History   Tobacco Use  . Smoking status: Former    Current packs/day: 0.00    Average  packs/day: 0.5 packs/day for 15.0 years (7.5 ttl pk-yrs)    Types: Cigarettes    Start date: 01/08/1972    Quit date: 01/08/1987    Years since quitting: 37.4  . Smokeless tobacco: Former    Types: Chew    Quit date: 10/08/1988  . Tobacco comments:    second hand exposure as a child   Vaping Use  . Vaping status: Never Used  Substance Use Topics  . Alcohol use: No    Comment: Previous ETOH abuse.  Abstinent since 08/2016  . Drug use: No    Current Medications: Current Outpatient Medications  Medication Sig Dispense Refill  . predniSONE (DELTASONE) 5 MG tablet Take 1 tablet (5 mg total) by mouth once daily 120 tablet 4  . rosuvastatin  (CRESTOR ) 5 MG tablet Take 5 mg by mouth at bedtime    . SOLU-CORTEF  ACT-O-VIAL, PF, 100 mg/2 mL injection 100 mg intramuscular for adrenal crisis 2 mL 2  . syringe with needle, safety 3 mL 25 gauge x 1 Syrg Use 1 each as directed (for steroid administration) 1 each 12  . testosterone cypionate (DEPO-TESTOSTERONE) 200 mg/mL injection Inject 1 mL (200 mg total) into the muscle every 14 (fourteen) days 6 mL 4  . aspirin  81 MG EC tablet Take 81 mg by mouth once daily    . desvenlafaxine succinate (PRISTIQ) 50 MG ER tablet Take 50 mg by mouth once daily    .  lurasidone (LATUDA) 40 mg tablet Take 40 mg by mouth daily with breakfast    . pregabalin (LYRICA) 50 MG capsule Take 1 capsule (50 mg total) by mouth 2 (two) times daily for 30 days 60 capsule 0   No current facility-administered medications for this visit.     PHYSICAL EXAM: Vitals:   06/26/24 1052  BP: 111/70  Pulse: 83  Temp: 36.7 C (98 F)  TempSrc: Oral  SpO2: 99%  Weight: 81.6 kg (179 lb 14.3 oz)  PainSc: 0-No pain   Physical Exam: GEN: NAD, well-appearing, mentating well HEENT: sclera anicteric LYMPH: no cervical or supraclavicular LAD CV: RRR, no m/r/g PULM: CTAB, no crackles or wheezing, nl WOB ABD: soft, NTND, BS present, no hepatosplenomegaly EXT: no LE edema, warm  ext NEURO: CN II-XII intact, strength 5/5 in bilateral UEs/LEs (LLE may be mildly weaker than R), sensation to LT intact  Walking with  rollator for stability.   No labs today CT Chest: 12/20/2023 IMPRESSION:    Unchanged posttreatment changes of the right lung. No findings to suggest new or worsening metastatic disease in the chest.  ASSESSMENT and PLAN:  Mr. Weedman is an 68 y.o. with Stage IV non-small cell lung cancer. He has hx of treated brain metastasis, metastasis to liver, metastasis to bones. He has been on a clinical trial with nivolumab and ipilimumab, last treatment was Jan 2016. Hx of metastatic bone lesions bilateral femurs, surgical repaired. Hx treated brain metastasis.     He has had problems with fatigue, balance, left foot drop, and other issues. No evidence of cancer relapse.   Plan 1) I will follow up with endocrine for his adrenal insufficiency.  2) Hx brain mets,  bMRI and clinic evaluation with Jeffery Cobb.There is an evolving cerebellar area of hemorrhage/ prior treatment scar.  3) He has ongoing f/u with ortho-oncology.  4) CT Chest w/o recurrence or metastasis. Plan for visit in Nov 2026.   Encouraged to call with any new concerns.     Jeffery [name] SLOUGH, NP Thoracic Oncology  [facility]

## 2024-06-28 ENCOUNTER — Encounter (INDEPENDENT_AMBULATORY_CARE_PROVIDER_SITE_OTHER): Payer: Self-pay

## 2024-07-01 ENCOUNTER — Ambulatory Visit (HOSPITAL_COMMUNITY)
Admission: RE | Admit: 2024-07-01 | Discharge: 2024-07-01 | Disposition: A | Discharge status: Home / Self Care | Source: Ambulatory Visit

## 2024-07-01 DIAGNOSIS — R972 Elevated prostate specific antigen [PSA]: Secondary | ICD-10-CM | POA: Diagnosis present

## 2024-07-01 MED ORDER — GADOBUTROL 1 MMOL/ML IV SOLN
10.0000 mL | Freq: Once | INTRAVENOUS | Status: AC | PRN
Start: 2024-07-01 — End: 2024-07-01
  Administered 2024-07-01: 10 mL via INTRAVENOUS

## 2024-07-13 ENCOUNTER — Encounter (HOSPITAL_COMMUNITY): Payer: Self-pay | Admitting: Gastroenterology

## 2024-07-20 NOTE — Anesthesia Preprocedure Evaluation (Signed)
 Anesthesia Evaluation  Patient identified by MRN, date of birth, ID band Patient awake    Reviewed: Allergy & Precautions, H&P , NPO status , Patient's Chart, lab work & pertinent test results  Airway Mallampati: II  TM Distance: >3 FB Neck ROM: Full    Dental no notable dental hx.    Pulmonary neg pulmonary ROS, former smoker   Pulmonary exam normal breath sounds clear to auscultation       Cardiovascular Exercise Tolerance: Good + CAD  negative cardio ROS Normal cardiovascular exam Rhythm:Regular Rate:Normal     Neuro/Psych  PSYCHIATRIC DISORDERS Anxiety Depression Bipolar Disorder   negative neurological ROS  negative psych ROS   GI/Hepatic negative GI ROS, Neg liver ROS,,,  Endo/Other  negative endocrine ROSHypothyroidism    Renal/GU negative Renal ROS  negative genitourinary   Musculoskeletal negative musculoskeletal ROS (+)    Abdominal   Peds negative pediatric ROS (+)  Hematology negative hematology ROS (+)   Anesthesia Other Findings Adrenal insufficiency C. difficile colitis H/O: lung cancer ICH (intracerebral hemorrhage) (HCC) Lung cancer metastatic to bone (HCC) Malignant neoplasm metastatic to brain [facility]) Malignant neoplasm metastatic to brain [facility]) Metastatic cancer (HCC) Metastatic lung cancer (metastasis from lung to other site) (HCC) Pancolitis Portal vein thrombosis SAH (subarachnoid hemorrhage) (HCC) Short-term memory loss    Reproductive/Obstetrics negative OB ROS                              Anesthesia Physical Anesthesia Plan  ASA: 4  Anesthesia Plan: MAC   Post-op Pain Management: Minimal or no pain anticipated   Induction: Intravenous  PONV Risk Score and Plan: 1 and Propofol infusion  Airway Management Planned: Mask and Natural Airway  Additional Equipment: None  Intra-op Plan:   Post-operative Plan:   Informed Consent: I have reviewed  the patients History and Physical, chart, labs and discussed the procedure including the risks, benefits and alternatives for the proposed anesthesia with the patient or authorized representative who has indicated his/her understanding and acceptance.       Plan Discussed with: Anesthesiologist and CRNA  Anesthesia Plan Comments:          Anesthesia Quick Evaluation

## 2024-07-21 ENCOUNTER — Encounter (HOSPITAL_COMMUNITY)
Admission: RE | Disposition: A | Discharge status: Home / Self Care | Payer: Self-pay | Source: Home / Self Care | Attending: Gastroenterology

## 2024-07-21 ENCOUNTER — Encounter (HOSPITAL_COMMUNITY): Payer: Self-pay | Admitting: Gastroenterology

## 2024-07-21 ENCOUNTER — Encounter (HOSPITAL_COMMUNITY): Payer: Self-pay | Admitting: Anesthesiology

## 2024-07-21 ENCOUNTER — Ambulatory Visit (HOSPITAL_COMMUNITY)
Admission: RE | Admit: 2024-07-21 | Discharge: 2024-07-21 | Disposition: A | Discharge status: Home / Self Care | Attending: Gastroenterology | Admitting: Gastroenterology

## 2024-07-21 ENCOUNTER — Ambulatory Visit (HOSPITAL_COMMUNITY): Payer: Self-pay | Admitting: Anesthesiology

## 2024-07-21 ENCOUNTER — Other Ambulatory Visit: Payer: Self-pay

## 2024-07-21 DIAGNOSIS — I2510 Atherosclerotic heart disease of native coronary artery without angina pectoris: Secondary | ICD-10-CM

## 2024-07-21 DIAGNOSIS — K5730 Diverticulosis of large intestine without perforation or abscess without bleeding: Secondary | ICD-10-CM | POA: Diagnosis not present

## 2024-07-21 DIAGNOSIS — Z87891 Personal history of nicotine dependence: Secondary | ICD-10-CM

## 2024-07-21 DIAGNOSIS — K2971 Gastritis, unspecified, with bleeding: Secondary | ICD-10-CM

## 2024-07-21 DIAGNOSIS — F418 Other specified anxiety disorders: Secondary | ICD-10-CM | POA: Diagnosis not present

## 2024-07-21 DIAGNOSIS — D649 Anemia, unspecified: Secondary | ICD-10-CM | POA: Insufficient documentation

## 2024-07-21 HISTORY — PX: BIOPSY OF SKIN SUBCUTANEOUS TISSUE AND/OR MUCOUS MEMBRANE: SHX6741

## 2024-07-21 HISTORY — PX: ESOPHAGOGASTRODUODENOSCOPY: SHX5428

## 2024-07-21 HISTORY — PX: COLONOSCOPY: SHX5424

## 2024-07-21 SURGERY — COLONOSCOPY
Anesthesia: Monitor Anesthesia Care

## 2024-07-21 MED ORDER — PROPOFOL 1000 MG/100ML IV EMUL
INTRAVENOUS | Status: AC
  Filled 2024-07-21: qty 100

## 2024-07-21 MED ORDER — PROPOFOL 500 MG/50ML IV EMUL
INTRAVENOUS | Status: DC | PRN
  Administered 2024-07-21: 80 mg via INTRAVENOUS
  Administered 2024-07-21: 20 mg via INTRAVENOUS
  Administered 2024-07-21: 100 ug/kg/min via INTRAVENOUS

## 2024-07-21 MED ORDER — SODIUM CHLORIDE 0.9 % IV SOLN: INTRAVENOUS | Status: DC

## 2024-07-21 MED ORDER — PROPOFOL 10 MG/ML IV BOLUS
INTRAVENOUS | Status: AC
  Filled 2024-07-21: qty 20

## 2024-07-21 NOTE — Op Note (Signed)
 [facility] Patient Name: Jeffery Cobb Procedure Date: 07/21/2024 MRN: [id number] Attending MD: [name] , MD, [id number] Date of Birth: 1955-08-26 CSN: [id number] Age: 68 Admit Type: Outpatient Procedure:                Colonoscopy Indications:              This is the patient's first colonoscopy, Iron                            deficiency anemia secondary to chronic blood loss Providers:                Jerrell KYM Sol, MD, Ozell Pouch, Farris Southgate, Technician Referring MD:             Elfrieda Haggard, MD Medicines:                Propofol per Anesthesia, Monitored Anesthesia Care Complications:            No immediate complications. Estimated Blood Loss:     Estimated blood loss: none. Procedure:                Pre-Anesthesia Assessment:                           - Prior to the procedure, a History and Physical                            was performed, and patient medications and                            allergies were reviewed. The patient's tolerance of                            previous anesthesia was also reviewed. The risks                            and benefits of the procedure and the sedation                            options and risks were discussed with the patient.                            All questions were answered, and informed consent                            was obtained. Prior Anticoagulants: The patient has                            taken no anticoagulant or antiplatelet agents. ASA                            Grade Assessment: IV - A patient with severe  systemic disease that is a constant threat to life.                            After reviewing the risks and benefits, the patient                            was deemed in satisfactory condition to undergo the                            procedure.                           After obtaining informed consent, the  colonoscope                            was passed under direct vision. Throughout the                            procedure, the patient's blood pressure, pulse, and                            oxygen saturations were monitored continuously. The                            [id number]-[id number] ([id number] Olympus Colonoscope was                            introduced through the anus and advanced to the the                            cecum, identified by appendiceal orifice and                            ileocecal valve. The colonoscopy was performed with                            difficulty due to significant looping, a tortuous                            colon and fair prep. Successful completion of the                            procedure was aided by straightening and shortening                            the scope to obtain bowel loop reduction, applying                            abdominal pressure and lavage. The patient                            tolerated the procedure well. The quality of the  bowel preparation was fair and fair but repeated                            irrigation led to a good and adequate prep. The                            terminal ileum, ileocecal valve, appendiceal                            orifice, and rectum were photographed. Scope In: 9:14:07 AM Scope Out: 9:25:03 AM Scope Withdrawal Time: 0 hours 6 minutes 4 seconds  Total Procedure Duration: 0 hours 10 minutes 56 seconds  Findings:      The perianal and digital rectal examinations were normal.      Internal hemorrhoids were found during retroflexion. The hemorrhoids       were large and Grade I (internal hemorrhoids that do not prolapse).      Scattered medium-mouthed and small-mouthed diverticula were found in the       sigmoid colon.      The terminal ileum appeared normal. Impression:               - Preparation of the colon was fair.                           - Internal  hemorrhoids.                           - Diverticulosis in the sigmoid colon.                           - The examined portion of the ileum was normal.                           - No specimens collected. Moderate Sedation:      N/A - MAC procedure Recommendation:           - Patient has a contact number available for                            emergencies. The signs and symptoms of potential                            delayed complications were discussed with the                            patient. Return to normal activities tomorrow.                            Written discharge instructions were provided to the                            patient.                           - Resume previous diet.                           -  Repeat colonoscopy in 10 years for screening                            purposes. Procedure Code(s):        --- Professional ---                           [phone], Colonoscopy, flexible; diagnostic, including                            collection of specimen(s) by brushing or washing,                            when performed (separate procedure) Diagnosis Code(s):        --- Professional ---                           D50.0, Iron deficiency anemia secondary to blood                            loss (chronic)                           K64.0, First degree hemorrhoids                           K57.30, Diverticulosis of large intestine without                            perforation or abscess without bleeding CPT copyright 2022 [employer]. All rights reserved. The codes documented in this report are preliminary and upon coder review may  be revised to meet current compliance requirements. [name], MD 07/21/2024 9:40:01 AM This report has been signed electronically. Number of Addenda: 0

## 2024-07-21 NOTE — Interval H&P Note (Signed)
 History and Physical Interval Note:  07/21/2024 8:49 AM  Jeffery Cobb  has presented today for surgery, with the diagnosis of D64.9 anemia and R19.5 Fecal occult blood test positive.  The various methods of treatment have been discussed with the patient and family. After consideration of risks, benefits and other options for treatment, the patient has consented to  Procedure(s): COLONOSCOPY (N/A) EGD (ESOPHAGOGASTRODUODENOSCOPY) (N/A) as a surgical intervention.  The patient's history has been reviewed, patient examined, no change in status, stable for surgery.  I have reviewed the patient's chart and labs.  Questions were answered to the patient's satisfaction.     [name]

## 2024-07-21 NOTE — Anesthesia Postprocedure Evaluation (Signed)
 Anesthesia Post Note  Patient: Jeffery Cobb  Procedure(s) Performed: COLONOSCOPY EGD (ESOPHAGOGASTRODUODENOSCOPY) BIOPSY, SKIN, SUBCUTANEOUS TISSUE, OR MUCOUS MEMBRANE     Patient location during evaluation: PACU Anesthesia Type: MAC Level of consciousness: awake and alert Pain management: pain level controlled Vital Signs Assessment: post-procedure vital signs reviewed and stable Respiratory status: spontaneous breathing, nonlabored ventilation, respiratory function stable and patient connected to nasal cannula oxygen Cardiovascular status: stable and blood pressure returned to baseline Postop Assessment: no apparent nausea or vomiting Anesthetic complications: no   No notable events documented.  Last Vitals:  Vitals:   07/21/24 0940 07/21/24 0950  BP: 105/62 109/65  Pulse: 66 71  Resp: 19 13  Temp:    SpO2: 100% 100%    Last Pain:  Vitals:   07/21/24 0955  TempSrc:   PainSc: 0-No pain                 [name]

## 2024-07-21 NOTE — H&P (Signed)
 Date of Initial H&P: 06/25/24  History reviewed, patient examined, no change in status, stable for surgery.

## 2024-07-21 NOTE — Discharge Instructions (Signed)
 YOU HAD AN ENDOSCOPIC PROCEDURE TODAY: Refer to the procedure report and other information in the discharge instructions given to you for any specific questions about what was found during the examination. If this information does not answer your questions, please call [facility] office at [phone] to clarify.   YOU SHOULD EXPECT: Some feelings of bloating in the abdomen. Passage of more gas than usual. Walking can help get rid of the air that was put into your GI tract during the procedure and reduce the bloating. If you had a lower endoscopy (such as a colonoscopy or flexible sigmoidoscopy) you may notice spotting of blood in your stool or on the toilet paper. Some abdominal soreness may be present for a day or two, also.  DIET: Your first meal following the procedure should be a light meal and then it is ok to progress to your normal diet. A half-sandwich or bowl of soup is an example of a good first meal. Heavy or fried foods are harder to digest and may make you feel nauseous or bloated. Drink plenty of fluids but you should avoid alcoholic beverages for 24 hours.    ACTIVITY: Your care partner should take you home directly after the procedure. You should plan to take it easy, moving slowly for the rest of the day. You can resume normal activity the day after the procedure however YOU SHOULD NOT DRIVE, use power tools, machinery or perform tasks that involve climbing or major physical exertion for 24 hours (because of the sedation medicines used during the test).   SYMPTOMS TO REPORT IMMEDIATELY: A gastroenterologist can be reached at any hour. Please call [phone]  for any of the following symptoms:  Following lower endoscopy (colonoscopy, flexible sigmoidoscopy) Excessive amounts of blood in the stool  Significant tenderness, worsening of abdominal pains  Swelling of the abdomen that is new, acute  Fever of 100 or higher  Following upper endoscopy (EGD, EUS, ERCP, esophageal  dilation) Vomiting of blood or coffee ground material  New, significant abdominal pain  New, significant chest pain or pain under the shoulder blades  Painful or persistently difficult swallowing  New shortness of breath  Black, tarry-looking or red, bloody stools  FOLLOW UP:  If any biopsies were taken you will be contacted by phone or by letter within the next 1-3 weeks. Call [phone]  if you have not heard about the biopsies in 3 weeks.  Please also call with any specific questions about appointments or follow up tests. YOU HAD AN ENDOSCOPIC PROCEDURE TODAY: Refer to the procedure report and other information in the discharge instructions given to you for any specific questions about what was found during the examination. If this information does not answer your questions, please call [facility] office at [phone] to clarify.

## 2024-07-21 NOTE — Transfer of Care (Signed)
 Immediate Anesthesia Transfer of Care Note  Patient: Jeffery Cobb  Procedure(s) Performed: COLONOSCOPY EGD (ESOPHAGOGASTRODUODENOSCOPY) BIOPSY, SKIN, SUBCUTANEOUS TISSUE, OR MUCOUS MEMBRANE  Patient Location: PACU and Endoscopy Unit  Anesthesia Type:MAC  Level of Consciousness: drowsy and responds to stimulation  Airway & Oxygen Therapy: Patient Spontanous Breathing  Post-op Assessment: Report given to RN and Post -op Vital signs reviewed and stable  Post vital signs: Reviewed and stable  Last Vitals:  Vitals Value Taken Time  BP 105/70 07/21/24 09:31  Temp 36.3 C 07/21/24 09:31  Pulse 78 07/21/24 09:31  Resp 15 07/21/24 09:31  SpO2 96 % 07/21/24 09:31  Vitals shown include unfiled device data.  Last Pain:  Vitals:   07/21/24 0931  TempSrc: Temporal  PainSc:          Complications: No notable events documented.

## 2024-07-21 NOTE — Anesthesia Procedure Notes (Signed)
 Procedure Name: MAC Date/Time: 07/21/2024 8:59 AM  Performed by: Nada Corean CROME, CRNAPre-anesthesia Checklist: Patient identified, Emergency Drugs available, Suction available, Patient being monitored and Timeout performed Patient Re-evaluated:Patient Re-evaluated prior to induction Oxygen Delivery Method: Simple face mask Preoxygenation: Pre-oxygenation with 100% oxygen Induction Type: IV induction Placement Confirmation: positive ETCO2 Dental Injury: Teeth and Oropharynx as per pre-operative assessment

## 2024-07-21 NOTE — Op Note (Signed)
 [facility] Patient Name: Jeffery Cobb Procedure Date: 07/21/2024 MRN: [id number] Attending MD: [name] , MD, [id number] Date of Birth: 09-28-1955 CSN: [id number] Age: 68 Admit Type: Outpatient Procedure:                Upper GI endoscopy Indications:              Iron deficiency anemia secondary to chronic blood                            loss, Heme positive stool Providers:                Jerrell KYM Sol, MD, Ozell Pouch, Farris [place]gate, Technician Referring MD:             Elfrieda Haggard, MD Medicines:                Propofol per Anesthesia, Monitored Anesthesia Care Complications:            No immediate complications. Estimated Blood Loss:     Estimated blood loss was minimal. Procedure:                Pre-Anesthesia Assessment:                           - Prior to the procedure, a History and Physical                            was performed, and patient medications and                            allergies were reviewed. The patient's tolerance of                            previous anesthesia was also reviewed. The risks                            and benefits of the procedure and the sedation                            options and risks were discussed with the patient.                            All questions were answered, and informed consent                            was obtained. Prior Anticoagulants: The patient has                            taken no anticoagulant or antiplatelet agents. ASA                            Grade Assessment: IV - A patient with severe  systemic disease that is a constant threat to life.                            After reviewing the risks and benefits, the patient                            was deemed in satisfactory condition to undergo the                            procedure.                           After obtaining informed consent, the endoscope was                             passed under direct vision. Throughout the                            procedure, the patient's blood pressure, pulse, and                            oxygen saturations were monitored continuously. The                            [id number] ([id number]) Olympus endoscope was introduced                            through the mouth, and advanced to the second part                            of duodenum. The upper GI endoscopy was                            accomplished without difficulty. The patient                            tolerated the procedure well. Scope In: Scope Out: Findings:      The examined esophagus was normal.      The Z-line was regular and was found 42 cm from the incisors.      Patchy moderate inflammation with hemorrhage characterized by congestion       (edema), erosions and erythema was found in the gastric body and in the       gastric antrum. Biopsies were taken with a cold forceps for histology.       Estimated blood loss was minimal.      The cardia and gastric fundus were normal on retroflexion.      Patchy mild mucosal changes characterized by congestion and erythema       were found in the duodenal bulb and in the second portion of the       duodenum. Biopsies were taken with a cold forceps for histology.       Estimated blood loss was minimal. Impression:               - Normal esophagus.                           -  Z-line regular, 42 cm from the incisors.                           - Gastritis with hemorrhage. Biopsied.                           - Mucosal changes in the duodenum. Biopsied. Moderate Sedation:      N/A - MAC procedure Recommendation:           - Patient has a contact number available for                            emergencies. The signs and symptoms of potential                            delayed complications were discussed with the                            patient. Return to normal activities tomorrow.                             Written discharge instructions were provided to the                            patient.                           - Resume previous diet.                           - Await pathology results. Procedure Code(s):        --- Professional ---                           [phone], Esophagogastroduodenoscopy, flexible,                            transoral; with biopsy, single or multiple Diagnosis Code(s):        --- Professional ---                           D50.0, Iron deficiency anemia secondary to blood                            loss (chronic)                           K29.71, Gastritis, unspecified, with bleeding                           R19.5, Other fecal abnormalities                           K31.89, Other diseases of stomach and duodenum CPT copyright 2022 [employer] Association. All rights reserved. The codes documented in this report are preliminary and upon coder review may  be revised to meet current compliance requirements. [name], MD  07/21/2024 9:34:42 AM This report has been signed electronically. Number of Addenda: 0

## 2024-07-22 ENCOUNTER — Encounter (HOSPITAL_COMMUNITY): Payer: Self-pay | Admitting: Gastroenterology

## 2024-07-22 LAB — SURGICAL PATHOLOGY

## 2024-08-03 ENCOUNTER — Encounter (INDEPENDENT_AMBULATORY_CARE_PROVIDER_SITE_OTHER): Payer: Self-pay

## 2024-08-24 NOTE — Therapy (Addendum)
 " OUTPATIENT PHYSICAL THERAPY THORACOLUMBAR EVALUATION   Patient Name: Jeffery Cobb MRN: [id number] DOB:1955/09/26, 69 y.o., male Today's Date: 08/25/2024  END OF SESSION:  PT End of Session - 08/25/24 1621     Visit Number 1    Date for Recertification  10/20/24    Authorization Type Medicare/ [employer] Supplement    Progress Note Due on Visit 10    PT Start Time 1532    PT Stop Time 1611    PT Time Calculation (min) 39 min    Activity Tolerance Patient tolerated treatment well    Behavior During Therapy WFL for tasks assessed/performed          Past Medical History:  Diagnosis Date   Anxiety    Bipolar affective disorder (HCC)    CAD (coronary artery disease)    Cancer (HCC)    lung   Depression    Depression    Hypercholesterolemia    Hypopituitarism    Hypothyroidism (acquired)    Malignant neoplasm of right lung, unspecified part of lung (HCC)    Past Surgical History:  Procedure Laterality Date   BIOPSY OF SKIN SUBCUTANEOUS TISSUE AND/OR MUCOUS MEMBRANE  07/21/2024   Procedure: BIOPSY, SKIN, SUBCUTANEOUS TISSUE, OR MUCOUS MEMBRANE;  Surgeon: Jeffery Specking, MD;  Location: [facility];  Service: Gastroenterology;;   COLONOSCOPY N/A 07/21/2024   Procedure: COLONOSCOPY;  Surgeon: Jeffery Specking, MD;  Location: [facility];  Service: Gastroenterology;  Laterality: N/A;   ESOPHAGOGASTRODUODENOSCOPY N/A 07/21/2024   Procedure: EGD (ESOPHAGOGASTRODUODENOSCOPY);  Surgeon: Jeffery Specking, MD;  Location: [facility];  Service: Gastroenterology;  Laterality: N/A;   JOINT REPLACEMENT Left 6.20/2018   hip   Patient Active Problem List   Diagnosis Date Noted   Anemia 07/21/2024   SAH (subarachnoid hemorrhage) (HCC) 12/29/2017   Pancolitis 03/29/2017   C. 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) 01/07/2017   ICH (intracerebral hemorrhage) (HCC) 07/14/2016   Portal vein thrombosis 07/14/2016   Short-term  memory loss 07/14/2016   Malignant neoplasm metastatic to brain [facility]) 07/14/2016   Metastatic lung cancer (metastasis from lung to other site) [facility]) 07/13/2016   Malignant neoplasm metastatic to brain (HCC) 07/13/2016   Metastatic cancer (HCC) 04/18/2015   Adrenal insufficiency 07/13/2013    PCP: Jeffery Slater, MD   REFERRING PROVIDER: Ivonne Verneita Jansky, PA  REFERRING DIAG: M54.50 (ICD-10-CM) - Low back pain, unspecified G89.29 (ICD-10-CM) - Other chronic pain  Rationale for Evaluation and Treatment: Rehabilitation  THERAPY DIAG:  Other low back pain  Cramp and spasm  Muscle weakness (generalized)  ONSET DATE: Chronic  SUBJECTIVE:  SUBJECTIVE STATEMENT: Patient presents with low back pain that is chronic and over the last few years he has been getting injections over the last 6 months. He does not have use of his left quadriceps and that has caused him a lot of issues. 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 Before he was able to walk 5 miles a day and right now he is limited 2-67miles. He has increased pain in the mornings and it is really intense and it spasms. After mobility (walking, working out) his back feels better.  When he wakes up in the morning he uses his walker because it helps him move around better. [facility] swim[facility], walks[facility]   From 08/03/2024 MD Note: In remission from cancer for 11 years, no longer fitting the palliative care criteria. Oncologist has lost interest due to remission status. Current focus is on managing chronic back pain rather than cancer-related issues.    PERTINENT HISTORY:  Lung cancer metastatic to bone; depression; anxiety; bipolar disorder; Lt hip replacement; patient reports recent prostate cancer diagnosis (08/24/2024)  PAIN:   Are you having pain? Yes: NPRS scale: 4(currently) 8(worst)/10 Pain location: right side of lumbar spine Pain description: joint of electricity; spasm Aggravating factors: reaching; standing , walking  Relieving factors: movement  PRECAUTIONS: None  RED FLAGS: None   WEIGHT BEARING RESTRICTIONS: No  FALLS:  Has patient fallen in last 6 months? No  LIVING ENVIRONMENT: Lives with: lives alone Lives in: House/apartment Stairs: No Has following equipment at home: Vannie - 4 wheeled and Walking poles  OCCUPATION: Retired- Airline Pilot  PLOF: Independent, Independent with basic ADLs, Independent with household mobility with device, Independent with community mobility with device, Independent with community mobility without device, Independent with gait, Independent with transfers, and Leisure: Travel, Agricultural Consultant   PATIENT GOALS: Figure out a way to strengthen left leg  NEXT MD VISIT: March 2026  OBJECTIVE:  Note: Objective measures were completed at Evaluation unless otherwise noted.  DIAGNOSTIC FINDINGS:  08/25/2020 IMPRESSION: 1. Central compression deformity of the superior endplate of L2, new since 03/29/2017. 2. Chronic mild L3 compression deformity.  PATIENT SURVEYS:  Modified Oswestry: 18/50 36%  Interpretation of scores: Score Category Description  0-20% Minimal Disability The patient can cope with most living activities. Usually no treatment is indicated apart from advice on lifting, sitting and exercise  21-40% Moderate Disability The patient experiences more pain and difficulty with sitting, lifting and standing. Travel and social life are more difficult and they may be disabled from work. Personal care, sexual activity and sleeping are not grossly affected, and the patient can usually be managed by conservative means  41-60% Severe Disability Pain remains the main problem in this group, but activities of daily living are affected. These patients require a detailed  investigation  61-80% Crippled Back pain impinges on all aspects of the patients life. Positive intervention is required  81-100% Bed-bound These patients are either bed-bound or exaggerating their symptoms  Jeffery Cobb, et al. Surgery versus conservative management of stable thoracolumbar fracture: the PRESTO feasibility RCT. [place] ([place]): [employer]; 2021 Nov. [facility] Technology Assessment, No. 25.62.) Appendix 3, Oswestry Disability Index category descriptors. Available from: Findjewelers.cz  Minimally Clinically Important Difference (MCID) = 12.8%  COGNITION: Overall cognitive status: Within functional limits for tasks assessed     SENSATION: WFL  MUSCLE LENGTH: Hamstrings: decreased bilateral    POSTURE: rounded shoulders and forward head  PALPATION:  Increased muscle spasms throughout erector spinae Tspine & Lt spine No pain with Grade II joint mobs in lumbar spine  LUMBAR ROM:   AROM eval  Flexion   Extension   Right lateral flexion   Left lateral flexion   Right rotation   Left rotation    (Blank rows = not tested)  LOWER EXTREMITY ROM:   Decreased hip PROM bilateral no pain   LOWER EXTREMITY MMT:    MMT Right eval Left eval  Hip flexion 4+ 4-  Hip extension    Hip abduction 4+ 4-  Hip adduction 4+ 4-  Hip internal rotation    Hip external rotation    Knee flexion 4+ 4  Knee extension 4+ 4-  Ankle dorsiflexion    Ankle plantarflexion    Ankle inversion    Ankle eversion     (Blank rows = not tested)    FUNCTIONAL TESTS:  5 times sit to stand: 15.52 sec no UE support Timed up and go (TUG): 15.94 sec  GAIT:  Comments: decreased heel contact on Lt ; Lt knee hyperextension in stance;   TREATMENT DATE: 08/25/2024 Initial Evaluation & HEP created                                                                                                                                 PATIENT  EDUCATION:  Education details: PT eval findings, anticipated POC, progress with PT, and initial HEP  Person educated: Patient Education method: Explanation, Demonstration, and Handouts Education comprehension: verbalized understanding, returned demonstration, and needs further education  HOME EXERCISE PROGRAM: Access Code: [email or web address] URL: [email or web address] Date: 08/25/2024 Prepared by: Kristeen Sar  Exercises - Supine Lower Trunk Rotation  - 1 x daily - 7 x weekly - 1 sets - 8-10 reps - Supine Hip Internal and External Rotation  - 1 x daily - 7 x weekly - 1 sets - 8-10 reps - 5 hold - Sidelying Thoracic Rotation with Open Book  - 1 x daily - 7 x weekly - 1 sets - 10 reps - Seated Hamstring Stretch  - 1 x daily - 7 x weekly - 2 sets - 20-30 hold - Seated Quadratus Lumborum Stretch in Chair  - 1 x daily - 7 x weekly - 3 sets - 10 hold  ASSESSMENT:  CLINICAL IMPRESSION: Patient is a 69 y.o. male who was seen today for physical therapy evaluation and treatment for low back pain. Kamryn presents to skilled therapy with chronic low back pain for the last 10 years. He has minimal quadriceps function and he feels that has affected his gait mechanics and back. He is very active and walks, swims, or lifts weights 7x a week. He is pain is the worse in the mornings and with movements it gets better. Based on evaluation noted muscle weakness, increased muscle spasms, and decreased ROM. Patient is highly motivated and wants to maintain function and strengthen his legs. Patient  will benefit from skilled PT to address the below impairments and improve overall function.    OBJECTIVE IMPAIRMENTS: Abnormal gait, decreased balance, decreased mobility, difficulty walking, decreased ROM, decreased strength, hypomobility, increased muscle spasms, impaired flexibility, postural dysfunction, and pain.   ACTIVITY LIMITATIONS: carrying, lifting, bending, standing, squatting, stairs, transfers, bed  mobility, dressing, and hygiene/grooming  PARTICIPATION LIMITATIONS: cleaning, laundry, interpersonal relationship, and community activity  PERSONAL FACTORS: Time since onset of injury/illness/exacerbation and 3+ comorbidities: Lung cancer metastatic to bone; depression; anxiety; bipolar disorder are also affecting patient's functional outcome.   REHAB POTENTIAL: Good  CLINICAL DECISION MAKING: Evolving/moderate complexity  EVALUATION COMPLEXITY: Moderate   GOALS: Goals reviewed with patient? Yes  SHORT TERM GOALS: Target date: 09/22/2024  Patient will be independent with initial HEP. Baseline:  Goal status: INITIAL  2.  Patient will report > or = to 30% improvement in low back pain since starting PT. Baseline:  Goal status: INITIAL   3.  Patient will verbalized compliance with stretching after physical activity.  Baseline:  Goal status: INITIAL   LONG TERM GOALS: Target date: 10/20/2024  Patient will demonstrate independence in advanced HEP. Baseline:  Goal status: INITIAL  2.  Patient will report > or = to 60% improvement in low back pain since starting PT. Baseline:  Goal status: INITIAL  3.  Patient will verbalize and demonstrate self-care strategies to manage pain including tissue mobility practices and change of position. Baseline:  Goal status: INITIAL   4.  Patient will perform 5STS in < or = to 11 sec due to improved LE strength and functional mobility.  Baseline: 15.52 Goal status: INITIAL  5.  Patient will perform TUG in < or = to 12 sec to decrease fall risk. Baseline: 15.94 Goal status: INITIAL  6.  Patient will be able to carry at least 10-15# to help his ability to bring in groceries safely and without a loss of balance. Baseline:  Goal status: INITIAL  PLAN:  PT FREQUENCY: 1-2x/week  PT DURATION: 8 weeks  PLANNED INTERVENTIONS: 97164- PT Re-evaluation, 97110-Therapeutic exercises, 97530- Therapeutic activity, [id number]- Neuromuscular  re-education, 97535- Self Care, [id number]- Manual therapy, [id number]- Gait training, [phone]- Canalith repositioning, [id number]- Aquatic Therapy, [phone]- Splinting, [id number]- Electrical stimulation (unattended), [phone]- Electrical stimulation (manual), [id number]- Vasopneumatic device, [id number]- Traction (mechanical), [id number]- Ionotophoresis 4mg /ml Dexamethasone , 79439 (1-2 muscles), 20561 (3+ muscles)- Dry Needling, Patient/Family education, Balance training, Stair training, Taping, Joint mobilization, Joint manipulation, Spinal manipulation, Spinal mobilization, Vestibular training, Cryotherapy, and Moist heat.  PLAN FOR NEXT SESSION: Review HEP; NuStep/Recumbent Bike; lumbar & hip mobility; left LE strengthening (abductors, hip flexors, extensors)    Kristeen Sar, PT, DPT 08/25/2024 4:22 PM [facility] [address] [place], [place] [address] Phone # [phone] Fax [phone]   "

## 2024-08-25 ENCOUNTER — Encounter: Payer: Self-pay | Admitting: Physical Therapy

## 2024-08-25 ENCOUNTER — Other Ambulatory Visit: Payer: Self-pay

## 2024-08-25 ENCOUNTER — Ambulatory Visit: Attending: Physician Assistant | Admitting: Physical Therapy

## 2024-08-25 DIAGNOSIS — M6281 Muscle weakness (generalized): Secondary | ICD-10-CM | POA: Diagnosis not present

## 2024-08-25 DIAGNOSIS — M545 Low back pain, unspecified: Secondary | ICD-10-CM | POA: Diagnosis present

## 2024-08-25 DIAGNOSIS — G8929 Other chronic pain: Secondary | ICD-10-CM | POA: Insufficient documentation

## 2024-08-25 DIAGNOSIS — M5459 Other low back pain: Secondary | ICD-10-CM | POA: Insufficient documentation

## 2024-08-25 DIAGNOSIS — R252 Cramp and spasm: Secondary | ICD-10-CM

## 2024-08-26 ENCOUNTER — Other Ambulatory Visit (HOSPITAL_COMMUNITY): Payer: Self-pay | Admitting: Urology

## 2024-08-26 DIAGNOSIS — C61 Malignant neoplasm of prostate: Secondary | ICD-10-CM

## 2024-09-03 ENCOUNTER — Ambulatory Visit: Admitting: Physical Therapy

## 2024-09-04 ENCOUNTER — Telehealth: Payer: Self-pay

## 2024-09-04 ENCOUNTER — Encounter (HOSPITAL_COMMUNITY)
Admission: RE | Admit: 2024-09-04 | Discharge: 2024-09-04 | Disposition: A | Discharge status: Home / Self Care | Source: Ambulatory Visit | Attending: Urology | Admitting: Urology

## 2024-09-04 ENCOUNTER — Encounter: Payer: Self-pay | Admitting: Physical Therapy

## 2024-09-04 ENCOUNTER — Ambulatory Visit: Admitting: Physical Therapy

## 2024-09-04 DIAGNOSIS — M6281 Muscle weakness (generalized): Secondary | ICD-10-CM

## 2024-09-04 DIAGNOSIS — M5459 Other low back pain: Secondary | ICD-10-CM

## 2024-09-04 DIAGNOSIS — C61 Malignant neoplasm of prostate: Secondary | ICD-10-CM | POA: Diagnosis present

## 2024-09-04 DIAGNOSIS — G8929 Other chronic pain: Secondary | ICD-10-CM | POA: Diagnosis not present

## 2024-09-04 DIAGNOSIS — R252 Cramp and spasm: Secondary | ICD-10-CM

## 2024-09-04 MED ORDER — FLOTUFOLASTAT F 18 GALLIUM 296-5846 MBQ/ML IV SOLN
8.1900 | Freq: Once | INTRAVENOUS | Status: AC
  Administered 2024-09-04: 8.19 via INTRAVENOUS

## 2024-09-04 NOTE — Therapy (Signed)
 " OUTPATIENT PHYSICAL THERAPY THORACOLUMBAR TREATMENT   Patient Name: Jeffery Cobb MRN: [id number] DOB:September 03, 1955, 69 y.o., male Today's Date: 09/04/2024  END OF SESSION:  PT End of Session - 09/04/24 1103     Visit Number 2    Date for Recertification  10/20/24    Authorization Type Medicare/ Aetna Supplement    Progress Note Due on Visit 10    PT Start Time 0932    PT Stop Time 1012    PT Time Calculation (min) 40 min    Activity Tolerance Patient tolerated treatment well    Behavior During Therapy WFL for tasks assessed/performed           Past Medical History:  Diagnosis Date   Anxiety    Bipolar affective disorder (HCC)    CAD (coronary artery disease)    Cancer (HCC)    lung   Depression    Depression    Hypercholesterolemia    Hypopituitarism    Hypothyroidism (acquired)    Malignant neoplasm of right lung, unspecified part of lung (HCC)    Past Surgical History:  Procedure Laterality Date   BIOPSY OF SKIN SUBCUTANEOUS TISSUE AND/OR MUCOUS MEMBRANE  07/21/2024   Procedure: BIOPSY, SKIN, SUBCUTANEOUS TISSUE, OR MUCOUS MEMBRANE;  Surgeon: Dianna Specking, MD;  Location: [facility];  Service: Gastroenterology;;   COLONOSCOPY N/A 07/21/2024   Procedure: COLONOSCOPY;  Surgeon: Dianna Specking, MD;  Location: [facility];  Service: Gastroenterology;  Laterality: N/A;   ESOPHAGOGASTRODUODENOSCOPY N/A 07/21/2024   Procedure: EGD (ESOPHAGOGASTRODUODENOSCOPY);  Surgeon: Dianna Specking, MD;  Location: [facility];  Service: Gastroenterology;  Laterality: N/A;   JOINT REPLACEMENT Left 6.20/2018   hip   Patient Active Problem List   Diagnosis Date Noted   Anemia 07/21/2024   SAH (subarachnoid hemorrhage) (HCC) 12/29/2017   Pancolitis 03/29/2017   C. 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) 01/07/2017   ICH (intracerebral hemorrhage) (HCC) 07/14/2016   Portal vein thrombosis 07/14/2016   Short-term  memory loss 07/14/2016   Malignant neoplasm metastatic to brain [facility]) 07/14/2016   Metastatic lung cancer (metastasis from lung to other site) [facility]) 07/13/2016   Malignant neoplasm metastatic to brain (HCC) 07/13/2016   Metastatic cancer (HCC) 04/18/2015   Adrenal insufficiency 07/13/2013    PCP: Regino Slater, MD   REFERRING PROVIDER: Ivonne Verneita Jansky, PA  REFERRING DIAG: M54.50 (ICD-10-CM) - Low back pain, unspecified G89.29 (ICD-10-CM) - Other chronic pain  Rationale for Evaluation and Treatment: Rehabilitation  THERAPY DIAG:  Other low back pain  Cramp and spasm  Muscle weakness (generalized)  ONSET DATE: Chronic  SUBJECTIVE:  SUBJECTIVE STATEMENT: Patient reports he is doing okay today. He has been doing his exercises.   From Eval: Patient presents with low back pain that is chronic and over the last few years he has been getting injections over the last 6 months. He does not have use of his left quadriceps and that has caused him a lot of issues. 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 Before he was able to walk 5 miles a day and right now he is limited 2-65miles. He has increased pain in the mornings and it is really intense and it spasms. After mobility (walking, working out) his back feels better.  When he wakes up in the morning he uses his walker because it helps him move around better. [facility] swim[facility], walks[facility]   From 08/03/2024 MD Note: In remission from cancer for 11 years, no longer fitting the palliative care criteria. Oncologist has lost interest due to remission status. Current focus is on managing chronic back pain rather than cancer-related issues.    PERTINENT HISTORY:  Lung cancer metastatic to bone; depression; anxiety; bipolar disorder; Lt  hip replacement; patient reports recent prostate cancer diagnosis (08/24/2024)  PAIN:  Are you having pain? Yes: NPRS scale: 4(currently) 8(worst)/10 Pain location: right side of lumbar spine Pain description: joint of electricity; spasm Aggravating factors: reaching; standing , walking  Relieving factors: movement  PRECAUTIONS: None  RED FLAGS: None   WEIGHT BEARING RESTRICTIONS: No  FALLS:  Has patient fallen in last 6 months? No  LIVING ENVIRONMENT: Lives with: lives alone Lives in: House/apartment Stairs: No Has following equipment at home: Vannie - 4 wheeled and Walking poles  OCCUPATION: Retired- Airline Pilot  PLOF: Independent, Independent with basic ADLs, Independent with household mobility with device, Independent with community mobility with device, Independent with community mobility without device, Independent with gait, Independent with transfers, and Leisure: Travel, Agricultural Consultant   PATIENT GOALS: Figure out a way to strengthen left leg  NEXT MD VISIT: March 2026  OBJECTIVE:  Note: Objective measures were completed at Evaluation unless otherwise noted.  DIAGNOSTIC FINDINGS:  08/25/2020 IMPRESSION: 1. Central compression deformity of the superior endplate of L2, new since 03/29/2017. 2. Chronic mild L3 compression deformity.  PATIENT SURVEYS:  Modified Oswestry: 18/50 36%  Interpretation of scores: Score Category Description  0-20% Minimal Disability The patient can cope with most living activities. Usually no treatment is indicated apart from advice on lifting, sitting and exercise  21-40% Moderate Disability The patient experiences more pain and difficulty with sitting, lifting and standing. Travel and social life are more difficult and they may be disabled from work. Personal care, sexual activity and sleeping are not grossly affected, and the patient can usually be managed by conservative means  41-60% Severe Disability Pain remains the main problem in this group,  but activities of daily living are affected. These patients require a detailed investigation  61-80% Crippled Back pain impinges on all aspects of the patients life. Positive intervention is required  81-100% Bed-bound These patients are either bed-bound or exaggerating their symptoms  [name] [name] [name], et al. Surgery versus conservative management of stable thoracolumbar fracture: the PRESTO feasibility RCT. [place] ([place]): [employer]; 2021 Nov. [facility] Technology Assessment, No. 25.62.) Appendix 3, Oswestry Disability Index category descriptors. Available from: Findjewelers.cz  Minimally Clinically Important Difference (MCID) = 12.8%  COGNITION: Overall cognitive status: Within functional limits for tasks assessed     SENSATION: [facility]  MUSCLE LENGTH: Hamstrings: decreased bilateral    POSTURE: rounded shoulders and forward head  PALPATION: Increased muscle spasms throughout erector spinae Tspine & Lt spine No pain with Grade II joint mobs in lumbar spine  LUMBAR ROM:   AROM eval  Flexion   Extension   Right lateral flexion   Left lateral flexion   Right rotation   Left rotation    (Blank rows = not tested)  LOWER EXTREMITY ROM:   Decreased hip PROM bilateral no pain   LOWER EXTREMITY MMT:    MMT Right eval Left eval  Hip flexion 4+ 4-  Hip extension    Hip abduction 4+ 4-  Hip adduction 4+ 4-  Hip internal rotation    Hip external rotation    Knee flexion 4+ 4  Knee extension 4+ 4-  Ankle dorsiflexion    Ankle plantarflexion    Ankle inversion    Ankle eversion     (Blank rows = not tested)    FUNCTIONAL TESTS:  5 times sit to stand: 15.52 sec no UE support Timed up and go (TUG): 15.94 sec  GAIT:  Comments: decreased heel contact on Lt ; Lt knee hyperextension in stance;   TREATMENT DATE:  09/04/2024 Seated hamstring stretch  x 30 sec bilateral (this aggravated patient's back) Supine  hamstring stretch with green strap 2 x 30 sec bilateral  Supine LTR x 10 bilateral  Supine hip internal/external rotation x 10 bilateral  Supine SLR 2 x 10 bilateral  Supine bridges 2 x 10 with purple ball squeeze Side stepping with yellow loop around knees x 3 laps x 10 ft Seated clam with yellow loop 2 x 10 bilateral (doing one leg at a time) Pallof press with green TB x12 each direction 3 way stability ball stretch x 8 each direction Review and update of HEP  08/25/2024 Initial Evaluation & HEP created                                                                                                                                 PATIENT EDUCATION:  Education details: PT eval findings, anticipated POC, progress with PT, and initial HEP  Person educated: Patient Education method: Explanation, Demonstration, and Handouts Education comprehension: verbalized understanding, returned demonstration, and needs further education  HOME EXERCISE PROGRAM: Access Code: [email or web address] URL: [email or web address] Date: 09/04/2024 Prepared by: Kristeen Sar  Exercises - Supine Lower Trunk Rotation  - 1 x daily - 7 x weekly - 1 sets - 8-10 reps - Supine Hip Internal and External Rotation  - 1 x daily - 7 x weekly - 1 sets - 8-10 reps - 5 hold - Sidelying Thoracic Rotation with Open Book  - 1 x daily - 7 x weekly - 1 sets - 10 reps - Seated Hamstring Stretch  - 1 x daily - 7 x weekly - 2 sets - 20-30 hold - Seated Quadratus Lumborum Stretch in Chair  -  1 x daily - 7 x weekly - 3 sets - 10 hold - Seated Cervical Rotation AROM  - 1 x daily - 7 x weekly - 1 sets - 5 reps - 5 hold - Seated Upper Trapezius Stretch  - 1 x daily - 7 x weekly - 2 sets - 20-30s hold - Seated Cervical Flexion Stretch with Finger Support Behind Neck  - 1 x daily - 7 x weekly - 2 sets - 10 reps - Side Stepping with Resistance at Thighs  - 1 x daily - 7 x weekly - 3 sets - 5 reps - Seated Hip Abduction with Resistance  - 1  x daily - 7 x weekly - 2 sets - 10 reps  ASSESSMENT:  CLINICAL IMPRESSION: Meko presents to first follow up appointment since evaluation. He verbalized compliance with HEP and required minimal cues for form correction. He was challenged with sidelying hip abduction especially on the left. He occasional had increased back spasms but he responded well to stretching. Updated HEP to include exercise progression. PT provided verbal and visual cues as needed for correct exercise performance. Overall, patient should respond well to skilled therapy.    OBJECTIVE IMPAIRMENTS: Abnormal gait, decreased balance, decreased mobility, difficulty walking, decreased ROM, decreased strength, hypomobility, increased muscle spasms, impaired flexibility, postural dysfunction, and pain.   ACTIVITY LIMITATIONS: carrying, lifting, bending, standing, squatting, stairs, transfers, bed mobility, dressing, and hygiene/grooming  PARTICIPATION LIMITATIONS: cleaning, laundry, interpersonal relationship, and community activity  PERSONAL FACTORS: Time since onset of injury/illness/exacerbation and 3+ comorbidities: Lung cancer metastatic to bone; depression; anxiety; bipolar disorder are also affecting patient's functional outcome.   REHAB POTENTIAL: Good  CLINICAL DECISION MAKING: Evolving/moderate complexity  EVALUATION COMPLEXITY: Moderate   GOALS: Goals reviewed with patient? Yes  SHORT TERM GOALS: Target date: 09/22/2024  Patient will be independent with initial HEP. Baseline:  Goal status: INITIAL  2.  Patient will report > or = to 30% improvement in low back pain since starting PT. Baseline:  Goal status: INITIAL   3.  Patient will verbalized compliance with stretching after physical activity.  Baseline:  Goal status: INITIAL   LONG TERM GOALS: Target date: 10/20/2024  Patient will demonstrate independence in advanced HEP. Baseline:  Goal status: INITIAL  2.  Patient will report > or = to 60%  improvement in low back pain since starting PT. Baseline:  Goal status: INITIAL  3.  Patient will verbalize and demonstrate self-care strategies to manage pain including tissue mobility practices and change of position. Baseline:  Goal status: INITIAL   4.  Patient will perform 5STS in < or = to 11 sec due to improved LE strength and functional mobility.  Baseline: 15.52 Goal status: INITIAL  5.  Patient will perform TUG in < or = to 12 sec to decrease fall risk. Baseline: 15.94 Goal status: INITIAL  6.  Patient will be able to carry at least 10-15# to help his ability to bring in groceries safely and without a loss of balance. Baseline:  Goal status: INITIAL  PLAN:  PT FREQUENCY: 1-2x/week  PT DURATION: 8 weeks  PLANNED INTERVENTIONS: 97164- PT Re-evaluation, 97110-Therapeutic exercises, 97530- Therapeutic activity, [id number]- Neuromuscular re-education, 97535- Self Care, [id number]- Manual therapy, [id number]- Gait training, [phone]- Canalith repositioning, [id number]- Aquatic Therapy, [phone]- Splinting, H9716- Electrical stimulation (unattended), [phone]- Electrical stimulation (manual), [id number]- Vasopneumatic device, [id number]- Traction (mechanical), [id number]- Ionotophoresis 4mg /ml Dexamethasone , 79439 (1-2 muscles), [id number] (3+ muscles)- Dry Needling, Patient/Family education, Balance training, Stair training, Taping, Joint mobilization, Joint manipulation,  Spinal manipulation, Spinal mobilization, Vestibular training, Cryotherapy, and Moist heat.  PLAN FOR NEXT SESSION: assess tolerance to treatment session; NuStep/Recumbent Bike; lumbar & hip mobility; left LE strengthening (abductors, hip flexors, extensors)    Kristeen Sar, PT, DPT 09/04/24 11:04 AM [facility] [address] [place], [place] [address] Phone # [phone] Fax [phone]   "

## 2024-09-04 NOTE — Telephone Encounter (Signed)
 Wasn't able to leave a message, will mail packet

## 2024-09-07 NOTE — Progress Notes (Signed)
 RN was notified from [employer] ([employer] that tissue request could not be completed due to path sent out to [employer].

## 2024-09-07 NOTE — Progress Notes (Signed)
 RN spoke with Cole Pilar ([employer]) to inquire if path has been sent back from [facility]. Path remains with [facility].   RN left message with [facility] for call back to inquire if path review has been completed for send back.

## 2024-09-08 ENCOUNTER — Encounter (INDEPENDENT_AMBULATORY_CARE_PROVIDER_SITE_OTHER): Payer: Self-pay

## 2024-09-08 ENCOUNTER — Encounter (INDEPENDENT_AMBULATORY_CARE_PROVIDER_SITE_OTHER): Payer: Self-pay | Admitting: Otolaryngology

## 2024-09-08 ENCOUNTER — Ambulatory Visit (INDEPENDENT_AMBULATORY_CARE_PROVIDER_SITE_OTHER): Admitting: Otolaryngology

## 2024-09-08 VITALS — BP 119/74 | HR 86 | Ht 70.0 in | Wt 165.0 lb

## 2024-09-08 DIAGNOSIS — H9202 Otalgia, left ear: Secondary | ICD-10-CM

## 2024-09-08 NOTE — Progress Notes (Signed)
 Reason for Consult: Ear pain Referring Physician: Dr. Regino Rush Jeffery Cobb is an 69 y.o. male.  HPI: He is here for left ear pain.  He has had a history of vertigo and had significant workup at [facility] with ENT and vestibular evaluation.  He still has some of that occasionally.  His left ear pain has been going on for about a month.  He says it is worse when he yawns or chews.  He has no drainage from the ear.  No new tinnitus.  He does have new diagnosis of prostate cancer.  He still active with history of stage IV lung cancer.  His recent PET scan did not show any highlighted issues within the head neck.  Past Medical History:  Diagnosis Date   Anxiety    Bipolar affective disorder (HCC)    CAD (coronary artery disease)    Cancer (HCC)    lung   Depression    Depression    Hypercholesterolemia    Hypopituitarism    Hypothyroidism (acquired)    Malignant neoplasm of right lung, unspecified part of lung (HCC)     Past Surgical History:  Procedure Laterality Date   BIOPSY OF SKIN SUBCUTANEOUS TISSUE AND/OR MUCOUS MEMBRANE  07/21/2024   Procedure: BIOPSY, SKIN, SUBCUTANEOUS TISSUE, OR MUCOUS MEMBRANE;  Surgeon: Dianna Specking, MD;  Location: [facility];  Service: Gastroenterology;;   COLONOSCOPY N/A 07/21/2024   Procedure: COLONOSCOPY;  Surgeon: Dianna Specking, MD;  Location: [facility];  Service: Gastroenterology;  Laterality: N/A;   ESOPHAGOGASTRODUODENOSCOPY N/A 07/21/2024   Procedure: EGD (ESOPHAGOGASTRODUODENOSCOPY);  Surgeon: Dianna Specking, MD;  Location: [facility];  Service: Gastroenterology;  Laterality: N/A;   JOINT REPLACEMENT Left 6.20/2018   hip    Family History  Problem Relation Age of Onset   Lung cancer Mother    Breast cancer Sister     Social History:  reports that he quit smoking about 39 years ago. His smoking use included cigarettes. He uses smokeless tobacco. He reports that he does not drink alcohol and does not use  drugs.  Allergies: Allergies[1]   No results found for this or any previous visit (from the past 48 hours).  No results found.  ROS Blood pressure 119/74, pulse 86, height 5' 10 (1.778 m), weight 165 lb (74.8 kg), SpO2 98%. Physical Exam Constitutional:      Appearance: Normal appearance.  HENT:     Head: Normocephalic and atraumatic.     Right Ear: Tympanic membrane is without lesions and middle ear aerated, ear canal and external ear normal.     Left Ear: Tympanic membrane is without lesions and middle ear aerated, ear canal and external ear normal.  Manipulation of the left TMJ does reveal some crepitance.  May be a slight amount of discomfort in the right ear.    Nose: Nose without deviation of septum.  Turbinates with mild hypertrophy, No significant swelling or masses.     Oral cavity/oropharynx: Mucous membranes are moist. No lesions or masses    Larynx: normal voice. Mirror attempted without success    Eyes:     Extraocular Movements: Extraocular movements intact.     Conjunctiva/sclera: Conjunctivae normal.     Pupils: Pupils are equal, round, and reactive to light.  Cardiovascular:     Rate and Rhythm: Normal rate.  Pulmonary:     Effort: Pulmonary effort is normal.  Musculoskeletal:     Cervical back: Normal range of motion and neck supple. No rigidity.  Lymphadenopathy:  Cervical: No cervical adenopathy or masses.salivary glands without lesions. .     Salivary glands- no mass or swelling Neurological:     Mental Status: He is alert. CN 2-12 intact. No nystagmus      Assessment/Plan: Left ear pain-it would seem that the most consistent and likely issue is TMJ problems.  I am going to refer him to oral surgery first to evaluate.  We talked about referred otalgia and all the locations that a ear pain can come from.  If he persists with this for much longer we need to do a fiberoptic exam of his larynx/pharynx but right now with a negative PET scan it seems  appropriate to pursue the TMJ especially since its exacerbated by chewing and eating.  The patient feels that probably on track as he does have some dental issues as well.  Norleen Notice 09/08/2024, 11:50 AM        [1] No Known Allergies

## 2024-09-10 ENCOUNTER — Ambulatory Visit: Admitting: Physical Therapy

## 2024-09-10 ENCOUNTER — Encounter: Payer: Self-pay | Admitting: Physical Therapy

## 2024-09-10 DIAGNOSIS — M6281 Muscle weakness (generalized): Secondary | ICD-10-CM

## 2024-09-10 DIAGNOSIS — M5459 Other low back pain: Secondary | ICD-10-CM

## 2024-09-10 DIAGNOSIS — R252 Cramp and spasm: Secondary | ICD-10-CM

## 2024-09-10 DIAGNOSIS — G8929 Other chronic pain: Secondary | ICD-10-CM | POA: Diagnosis not present

## 2024-09-10 NOTE — Therapy (Signed)
 " OUTPATIENT PHYSICAL THERAPY THORACOLUMBAR TREATMENT   Patient Name: Jeffery Cobb MRN: [id number] DOB:[birth year], 69 y.o., male Today's Date: 09/10/2024  END OF SESSION:  PT End of Session - 09/10/24 1708     Visit Number 3    Date for Recertification  10/20/24    Authorization Type Medicare/ Aetna Supplement    Progress Note Due on Visit 10    PT Start Time 1619    PT Stop Time 1658    PT Time Calculation (min) 39 min    Activity Tolerance Patient tolerated treatment well    Behavior During Therapy WFL for tasks assessed/performed            Past Medical History:  Diagnosis Date   Anxiety    Bipolar affective disorder (HCC)    CAD (coronary artery disease)    Cancer (HCC)    lung   Depression    Depression    Hypercholesterolemia    Hypopituitarism    Hypothyroidism (acquired)    Malignant neoplasm of right lung, unspecified part of lung (HCC)    Past Surgical History:  Procedure Laterality Date   BIOPSY OF SKIN SUBCUTANEOUS TISSUE AND/OR MUCOUS MEMBRANE  07/21/2024   Procedure: BIOPSY, SKIN, SUBCUTANEOUS TISSUE, OR MUCOUS MEMBRANE;  Surgeon: Dianna Specking, MD;  Location: [facility];  Service: Gastroenterology;;   COLONOSCOPY N/A 07/21/2024   Procedure: COLONOSCOPY;  Surgeon: Dianna Specking, MD;  [facility];  Service: Gastroenterology;  Laterality: N/A;   ESOPHAGOGASTRODUODENOSCOPY N/A 07/21/2024   Procedure: EGD (ESOPHAGOGASTRODUODENOSCOPY);  Surgeon: Dianna Specking, MD;  Location: [facility];  Service: Gastroenterology;  Laterality: N/A;   JOINT REPLACEMENT Left 6.20/2018   hip   Patient Active Problem List   Diagnosis Date Noted   Anemia 07/21/2024   SAH (subarachnoid hemorrhage) (HCC) 12/29/2017   Pancolitis 03/29/2017   C. 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) 01/07/2017   ICH (intracerebral hemorrhage) (HCC) 07/14/2016   Portal vein thrombosis 07/14/2016   Short-term  memory loss 07/14/2016   Malignant neoplasm metastatic to brain [facility]) 07/14/2016   Metastatic lung cancer (metastasis from lung to other site) [name]) 07/13/2016   Malignant neoplasm metastatic to brain (HCC) 07/13/2016   Metastatic cancer (HCC) 04/18/2015   Adrenal insufficiency 07/13/2013    PCP: Regino Slater, MD   REFERRING PROVIDER: Ivonne Verneita Jansky, PA  REFERRING DIAG: M54.50 (ICD-10-CM) - Low back pain, unspecified G89.29 (ICD-10-CM) - Other chronic pain  Rationale for Evaluation and Treatment: Rehabilitation  THERAPY DIAG:  Other low back pain  Cramp and spasm  Muscle weakness (generalized)  ONSET DATE: Chronic  SUBJECTIVE:  SUBJECTIVE STATEMENT: Patient reports his back is doing good. He feels the exercises are helping.   From Eval: Patient presents with low back pain that is chronic and over the last few years he has been getting injections over the last 6 months. He does not have use of his left quadriceps and that has caused him a lot of issues. 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 Before he was able to walk 5 miles a day and right now he is limited 2-54miles. He has increased pain in the mornings and it is really intense and it spasms. After mobility (walking, working out) his back feels better.  When he wakes up in the morning he uses his walker because it helps him move around better. [facility] swim[facility], walks[facility]   From 08/03/2024 MD Note: In remission from cancer for 11 years, no longer fitting the palliative care criteria. Oncologist has lost interest due to remission status. Current focus is on managing chronic back pain rather than cancer-related issues.    PERTINENT HISTORY:  Lung cancer metastatic to bone; depression; anxiety; bipolar disorder;  Lt hip replacement; patient reports recent prostate cancer diagnosis (08/24/2024)  PAIN:  Are you having pain? Yes: NPRS scale: 4(currently) 8(worst)/10 Pain location: right side of lumbar spine Pain description: joint of electricity; spasm Aggravating factors: reaching; standing , walking  Relieving factors: movement  PRECAUTIONS: None  RED FLAGS: None   WEIGHT BEARING RESTRICTIONS: No  FALLS:  Has patient fallen in last 6 months? No  LIVING ENVIRONMENT: Lives with: lives alone Lives in: House/apartment Stairs: No Has following equipment at home: Vannie - 4 wheeled and Walking poles  OCCUPATION: Retired- Airline Pilot  PLOF: Independent, Independent with basic ADLs, Independent with household mobility with device, Independent with community mobility with device, Independent with community mobility without device, Independent with gait, Independent with transfers, and Leisure: Travel, Agricultural Consultant   PATIENT GOALS: Figure out a way to strengthen left leg  NEXT MD VISIT: March 2026  OBJECTIVE:  Note: Objective measures were completed at Evaluation unless otherwise noted.  DIAGNOSTIC FINDINGS:  08/25/2020 IMPRESSION: 1. Central compression deformity of the superior endplate of L2, new since 03/29/2017. 2. Chronic mild L3 compression deformity.  PATIENT SURVEYS:  Modified Oswestry: 18/50 36%  Interpretation of scores: Score Category Description  0-20% Minimal Disability The patient can cope with most living activities. Usually no treatment is indicated apart from advice on lifting, sitting and exercise  21-40% Moderate Disability The patient experiences more pain and difficulty with sitting, lifting and standing. Travel and social life are more difficult and they may be disabled from work. Personal care, sexual activity and sleeping are not grossly affected, and the patient can usually be managed by conservative means  41-60% Severe Disability Pain remains the main problem in this  group, but activities of daily living are affected. These patients require a detailed investigation  61-80% Crippled Back pain impinges on all aspects of the patients life. Positive intervention is required  81-100% Bed-bound These patients are either bed-bound or exaggerating their symptoms  Jeffery Cobb Jeffery Cobb Jeffery Cobb, et al. Surgery versus conservative management of stable thoracolumbar fracture: the PRESTO feasibility RCT. [place] ([place]): [employer]; 2021 Nov. [facility] Technology Assessment, No. 25.62.) Appendix 3, Oswestry Disability Index category descriptors. Available from: Findjewelers.cz  Minimally Clinically Important Difference (MCID) = 12.8%  COGNITION: Overall cognitive status: Within functional limits for tasks assessed     SENSATION: [facility]  MUSCLE LENGTH: Hamstrings: decreased bilateral    POSTURE: rounded shoulders and forward head  PALPATION: Increased muscle spasms throughout erector spinae Tspine & Lt spine No pain with Grade II joint mobs in lumbar spine  LUMBAR ROM:   AROM eval  Flexion   Extension   Right lateral flexion   Left lateral flexion   Right rotation   Left rotation    (Blank rows = not tested)  LOWER EXTREMITY ROM:   Decreased hip PROM bilateral no pain   LOWER EXTREMITY MMT:    MMT Right eval Left eval  Hip flexion 4+ 4-  Hip extension    Hip abduction 4+ 4-  Hip adduction 4+ 4-  Hip internal rotation    Hip external rotation    Knee flexion 4+ 4  Knee extension 4+ 4-  Ankle dorsiflexion    Ankle plantarflexion    Ankle inversion    Ankle eversion     (Blank rows = not tested)    FUNCTIONAL TESTS:  5 times sit to stand: 15.52 sec no UE support Timed up and go (TUG): 15.94 sec  GAIT:  Comments: decreased heel contact on Lt ; Lt knee hyperextension in stance;   TREATMENT DATE:  09/10/2024 Supine bridges 2 x 10 with purple ball squeeze Supine SLR 2 x 10 bilateral  Prone hip  extension 2 x 10 bilateral (cues to press pelvis into table to decrease discomfort in back) Prone cervical rotation & flexion and extension x 10 each direction Side stepping with yellow loop around knees x 3 laps x 10 ft Standing hip abduction with yellow loop around knees x 12 bilateral  Standing hip extension with yellow loop around knees x 12 bilateral  Staggered sit to stand holding 8# DB x 10 each LE Standing march with unilateral 8 # DB hold x 12 bilateral (weight at shoulder height) Standing hinge x 10 (max verbal and visual cues) Standing RDL with 8# DB x 10 (max verbal and visual cues) 3 way stability ball stretch x 8 each direction Pallof press with green TB x15 each direction Supine LTR x 10 bilateral  Standing trunk rotation with green TB x10 each direction Supine hamstring stretch with green strap 2 x 30 sec bilateral     09/04/2024 Seated hamstring stretch  x 30 sec bilateral (this aggravated patient's back) Supine hamstring stretch with green strap 2 x 30 sec bilateral  Supine LTR x 10 bilateral  Supine hip internal/external rotation x 10 bilateral  Supine SLR 2 x 10 bilateral  Supine bridges 2 x 10 with purple ball squeeze Side stepping with yellow loop around knees x 3 laps x 10 ft Seated clam with yellow loop 2 x 10 bilateral (doing one leg at a time) Pallof press with green TB x12 each direction 3 way stability ball stretch x 8 each direction Review and update of HEP  08/25/2024 Initial Evaluation & HEP created  PATIENT EDUCATION:  Education details: PT eval findings, anticipated POC, progress with PT, and initial HEP  Person educated: Patient Education method: Explanation, Demonstration, and Handouts Education comprehension: verbalized understanding, returned demonstration, and needs further education  HOME EXERCISE PROGRAM: Access  Code: [email or web address] URL: [email or web address] Date: 09/04/2024 Prepared by: Kristeen Sar  Exercises - Supine Lower Trunk Rotation  - 1 x daily - 7 x weekly - 1 sets - 8-10 reps - Supine Hip Internal and External Rotation  - 1 x daily - 7 x weekly - 1 sets - 8-10 reps - 5 hold - Sidelying Thoracic Rotation with Open Book  - 1 x daily - 7 x weekly - 1 sets - 10 reps - Seated Hamstring Stretch  - 1 x daily - 7 x weekly - 2 sets - 20-30 hold - Seated Quadratus Lumborum Stretch in Chair  - 1 x daily - 7 x weekly - 3 sets - 10 hold - Seated Cervical Rotation AROM  - 1 x daily - 7 x weekly - 1 sets - 5 reps - 5 hold - Seated Upper Trapezius Stretch  - 1 x daily - 7 x weekly - 2 sets - 20-30s hold - Seated Cervical Flexion Stretch with Finger Support Behind Neck  - 1 x daily - 7 x weekly - 2 sets - 10 reps - Side Stepping with Resistance at Thighs  - 1 x daily - 7 x weekly - 3 sets - 5 reps - Seated Hip Abduction with Resistance  - 1 x daily - 7 x weekly - 2 sets - 10 reps  ASSESSMENT:  CLINICAL IMPRESSION: Lelend presents with minimal back discomfort currently. He has been compliant with HEP and feels they are helping overall. He still has been having neck stiffness so educated patient on exercises he can do. He is challenged when current exercise intensity level. He requires occasional rest breaks due to fatigue. Required max verbal and visual cues for proper hinging technique. He verbalized discomfort in his back, but with decreased ROM patient felt muscle engagement in posterior chain. Overall, patient tolerated treatment session well and should continue to respond well to skilled therapy.    OBJECTIVE IMPAIRMENTS: Abnormal gait, decreased balance, decreased mobility, difficulty walking, decreased ROM, decreased strength, hypomobility, increased muscle spasms, impaired flexibility, postural dysfunction, and pain.   ACTIVITY LIMITATIONS: carrying, lifting, bending, standing, squatting,  stairs, transfers, bed mobility, dressing, and hygiene/grooming  PARTICIPATION LIMITATIONS: cleaning, laundry, interpersonal relationship, and community activity  PERSONAL FACTORS: Time since onset of injury/illness/exacerbation and 3+ comorbidities: Lung cancer metastatic to bone; depression; anxiety; bipolar disorder are also affecting patient's functional outcome.   REHAB POTENTIAL: Good  CLINICAL DECISION MAKING: Evolving/moderate complexity  EVALUATION COMPLEXITY: Moderate   GOALS: Goals reviewed with patient? Yes  SHORT TERM GOALS: Target date: 09/22/2024  Patient will be independent with initial HEP. Baseline:  Goal status: MET 09/10/2024  2.  Patient will report > or = to 30% improvement in low back pain since starting PT. Baseline:  Goal status: INITIAL   3.  Patient will verbalized compliance with stretching after physical activity.  Baseline:  Goal status: INITIAL   LONG TERM GOALS: Target date: 10/20/2024  Patient will demonstrate independence in advanced HEP. Baseline:  Goal status: INITIAL  2.  Patient will report > or = to 60% improvement in low back pain since starting PT. Baseline:  Goal status: INITIAL  3.  Patient will verbalize and demonstrate self-care strategies to manage pain including tissue mobility practices and  change of position. Baseline:  Goal status: INITIAL   4.  Patient will perform 5STS in < or = to 11 sec due to improved LE strength and functional mobility.  Baseline: 15.52 Goal status: INITIAL  5.  Patient will perform TUG in < or = to 12 sec to decrease fall risk. Baseline: 15.94 Goal status: INITIAL  6.  Patient will be able to carry at least 10-15# to help his ability to bring in groceries safely and without a loss of balance. Baseline:  Goal status: INITIAL  PLAN:  PT FREQUENCY: 1-2x/week  PT DURATION: 8 weeks  PLANNED INTERVENTIONS: 97164- PT Re-evaluation, 97110-Therapeutic exercises, 97530- Therapeutic activity,  [id number]- Neuromuscular re-education, 97535- Self Care, [id number]- Manual therapy, [id number]- Gait training, [phone]- Canalith repositioning, [id number]- Aquatic Therapy, [phone]- Splinting, H9716- Electrical stimulation (unattended), [phone]- Electrical stimulation (manual), [id number]- Vasopneumatic device, [id number]- Traction (mechanical), [id number]- Ionotophoresis 4mg /ml Dexamethasone , 79439 (1-2 muscles), [id number] (3+ muscles)- Dry Needling, Patient/Family education, Balance training, Stair training, Taping, Joint mobilization, Joint manipulation, Spinal manipulation, Spinal mobilization, Vestibular training, Cryotherapy, and Moist heat.  PLAN FOR NEXT SESSION:hip matrix ; lumbar & hip mobility; left LE strengthening (abductors, hip flexors, extensors)    Kristeen Sar, PT, DPT 09/10/24 5:09 PM [facility] [address] [place], [place] [address] Phone # [phone] Fax [phone]   "

## 2024-09-11 NOTE — Progress Notes (Signed)
 RN spoke with patient and reviewed upcoming PMDC on 1/27.   RN also followed up with [facility] pathology, they will return pathology slides to [facility]) in [place]  [place].  RN will follow up to request tissue sample to be reviewed by our local pathologist as well.

## 2024-09-14 NOTE — Progress Notes (Unsigned)
 [facility] CONSULT NOTE  Patient Care Team: Regino Slater, MD as PCP - General (Family Medicine)  ASSESSMENT & PLAN:  Jeffery Cobb is a 69 y.o.male with history of Stage IV (T2a, NX, M1b) Lung cancer s/p ipi/nivo with last treatment 08/30/14. SRS to brain metastases, adrenal insufficiency, CAD, hypothyroidism, bipolar disordere being seen at [facility] for prostate cancer.  Biopsy on 07/28/24 showed 3 cores of 4+5=9 GG5 prostate adenocarcinoma. PSA was 7.2 on 07/31/24. MRI without transcapsular spread or SV involvement and PSMA PET showed localized disease.  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 iPSA 7.2 high risk PC Germline testing: eligible Somatic testing: not yet Treatment: to be determined  His case was discussed at tumor board with multiple specialists including radiation oncologist, urology oncologist, pathologist, radiologist. The patient was counseled on the natural history of prostate cancer and the standard treatment options that are available for prostate cancer.   For high risk, NCCN guidelines list both radiation therapy plus ADT (12-36 months) as Category 1 and radical prostatectomy as acceptable initial treatment options for patients with life expectancy greater than 5 years.  Treatment is recommended. Patient was encouraged to consider the side effect profiles of each treatment modality and make an informed decision for definitive treatment.  Patient has follow up at [facility] this week and will follow-up with radiation oncology and urologic oncology for definitive treatment and long term follow up after treatment.  He may follow-up with medical oncology as needed.  Assessment & Plan Malignant neoplasm of prostate (HCC) Treatment is recommended. Patient will consider his options. Follow up with [facility] later this week. He is having genetic testing there. Calcium  (1000-1200 mg daily from food and supplements) and vitamin D3 (1000 IU  daily) Healthy lifestyle to prevent diabetes and CV disease Weight-bearing exercises (30 minutes per day) Limit alcohol consumption and avoid smoking  All questions were answered. The patient knows to call the clinic with any problems, questions or concerns. No barriers to learning was detected.  Pauletta JAYSON Chihuahua, MD 1/26/20267:46 PM  CHIEF COMPLAINTS/PURPOSE OF CONSULTATION:  prostate cancer  HISTORY OF PRESENTING ILLNESS:  Jeffery Cobb 69 y.o. male is here because of prostate cancer.  I have reviewed his chart and materials related to his cancer extensively and collaborated history with the patient. Summary of oncologic history is as follows:  07/01/24 prostate MRI 1. Lesion in the posterior LEFT mid gland peripheral zone is concerning for high-grade prostate adenocarcinoma. PI-RADS 4. Region of interest #1 2. Less well-defined lesion in the posterior LEFT and RIGHT apex. PI-RADS 3. Region of interest 2.  07/28/24 Biopsy showed 3 cores of 4+5=9 GG5 prostate adenocarcinoma. [facility] reviewed agrees with the read.  07/31/24 PSA was 7.2.  09/04/24 PSMA PET IMPRESSION: 1. Focal activity in the posterior left lobe of the prostate gland, consistent with primary prostate carcinoma, corresponding to comparison MRI from July 01, 2024. 2. No PSMA-avid pelvic or abdominal lymph nodes. 3. No PSMA-avid skeletal metastases. 4. Evaluation of the pelvis is limited by streak artifact from the left hip prosthesis.  MEDICAL HISTORY:  Past Medical History:  Diagnosis Date   Anxiety    Bipolar affective disorder (HCC)    CAD (coronary artery disease)    Cancer (HCC)    lung   Depression    Depression    Hypercholesterolemia    Hypopituitarism    Hypothyroidism (acquired)    Malignant neoplasm of right lung, unspecified part of lung (HCC)     SURGICAL  HISTORY: Past Surgical History:  Procedure Laterality Date   BIOPSY OF SKIN SUBCUTANEOUS TISSUE AND/OR MUCOUS MEMBRANE  07/21/2024    Procedure: BIOPSY, SKIN, SUBCUTANEOUS TISSUE, OR MUCOUS MEMBRANE;  Surgeon: Dianna Specking, MD;  [facility];  Service: Gastroenterology;;   COLONOSCOPY N/A 07/21/2024   Procedure: COLONOSCOPY;  Surgeon: Dianna Specking, MD;  [facility];  Service: Gastroenterology;  Laterality: N/A;   ESOPHAGOGASTRODUODENOSCOPY N/A 07/21/2024   Procedure: EGD (ESOPHAGOGASTRODUODENOSCOPY);  Surgeon: Dianna Specking, MD;  Location: [facility];  Service: Gastroenterology;  Laterality: N/A;   JOINT REPLACEMENT Left 6.20/2018   hip    SOCIAL HISTORY: Social History   Socioeconomic History   Marital status: Divorced    Spouse name: Not on file   Number of children: Not on file   Years of education: Not on file   Highest education level: Not on file  Occupational History   Not on file  Tobacco Use   Smoking status: Former    Current packs/day: 0.00    Types: Cigarettes    Quit date: 10    Years since quitting: 39.0   Smokeless tobacco: Current  Vaping Use   Vaping status: Unknown  Substance and Sexual Activity   Alcohol use: No   Drug use: No   Sexual activity: Not on file  Other Topics Concern   Not on file  Social History Narrative   Not on file   Social Drivers of Health   Tobacco Use: High Risk (09/10/2024)   Patient History    Smoking Tobacco Use: Former    Smokeless Tobacco Use: Current    Passive Exposure: Not on file  Financial Resource Strain: Low Risk  (03/14/2024)   Received from [facility]   Overall Financial Resource Strain (CARDIA)    Difficulty of Paying Living Expenses: Not hard at all  Food Insecurity: No Food Insecurity (09/11/2024)   Epic    Worried About Radiation Protection Practitioner of Food in the Last Year: Never true    Ran Out of Food in the Last Year: Never true  Transportation Needs: No Transportation Needs (09/11/2024)   Epic    Lack of Transportation (Medical): No    Lack of Transportation (Non-Medical): No  Physical Activity:  Sufficiently Active (02/11/2024)   Received from [facility]   Exercise Vital Sign    On average, how many days per week do you engage in moderate to strenuous exercise (like a brisk walk)?: 4 days    On average, how many minutes do you engage in exercise at this level?: 60 min  Stress: No Stress Concern Present (02/11/2024)   Received from [facility] - Occupational Stress Questionnaire    Feeling of Stress : Not at all  Social Connections: Moderately Integrated (02/11/2024)   Received from [facility]   Social Network    How would you rate your social network (family, work, friends)?: Adequate participation with social networks  Intimate Partner Violence: Not At Risk (02/11/2024)   Received from [facility]   HITS    Over the last 12 months how often did your partner physically hurt you?: Never    Over the last 12 months how often did your partner insult you or talk down to you?: Never    Over the last 12 months how often did your partner threaten you with physical harm?: Never    Over the last 12 months how often did your partner scream or curse at you?:  Never  Depression (PHQ2-9): Not on file  Alcohol Screen: Not on file  Housing: Unknown (09/11/2024)   Epic    Unable to Pay for Housing in the Last Year: No    Number of Times Moved in the Last Year: Not on file    Homeless in the Last Year: No  Utilities: Not At Risk (09/11/2024)   Epic    Threatened with loss of utilities: No  Health Literacy: Not on file    FAMILY HISTORY: Family History  Problem Relation Age of Onset   Lung cancer Mother    Breast cancer Sister     ALLERGIES:  has no known allergies.  MEDICATIONS:  Current Outpatient Medications  Medication Sig Dispense Refill   amphetamine-dextroamphetamine (ADDERALL XR) 20 MG 24 hr capsule Take 20 mg by mouth daily.     aspirin  EC 81 MG tablet Take 1 tablet (81 mg total) by mouth daily. Swallow whole.     lurasidone  (LATUDA) 40 MG TABS tablet Take 40 mg by mouth daily at 6 (six) AM.     oxyCODONE  (ROXICODONE ) 5 MG immediate release tablet Take 1 tablet (5 mg total) by mouth every 6 (six) hours as needed for up to 10 doses. 10 tablet 0   predniSONE (DELTASONE) 5 MG tablet Take 5 mg by mouth daily at 6 (six) AM.     rosuvastatin  (CRESTOR ) 5 MG tablet Take 5 mg by mouth at bedtime.     testosterone cypionate (DEPOTESTOTERONE CYPIONATE) 100 MG/ML injection Inject 200 mg into the muscle every 14 (fourteen) days. For IM use only     No current facility-administered medications for this visit.    REVIEW OF SYSTEMS:   All relevant systems were reviewed with the patient and are negative.  PHYSICAL EXAMINATION: ECOG PERFORMANCE STATUS: 0  09/15/2024  Oncology Vitals   BP 119/76   Resp 20   SpO2 99 %   BSA (m2) 1.96 m2    GENERAL: alert, no distress and comfortable SKIN: skin color is normal, no jaundice LUNGS: Effort normal, no respiratory distress.  Clear to auscultation bilaterally HEART: regular rate & rhythm and no lower extremity edema ABDOMEN: soft, non-tender and nondistended  LABORATORY & PATHOLOGY DATA:  I have reviewed the results of labs, PSA and biopsy results related to his cancer.  RADIOGRAPHIC STUDIES: I have reviewed the radiological images related to his cancer during tumor board meeting.

## 2024-09-14 NOTE — Progress Notes (Unsigned)
" ° °                              Care Plan Summary  Name:  Jeffery Cobb DOB: 04/26/1956   Your Medical Team:   Urologist -  Dr. Gretel Ferrara, [facility]  Radiation Oncologist - Dr. Donnice Barge, [facility]   Medical Oncologist - Dr. Pauletta Chihuahua, [facility]  Recommendations: Surgery Hormonal Therapy/Radiation  Brachy boost with 5 weeks of IMRT 8 weeks of IMRT   * These recommendations are based on information available as of todays consult.      Recommendations may change depending on the results of further tests or exams.    Next Steps: Consider all your options.  Contact Vertell, your nurse navigator, with any questions or treatment decision.   When appointments need to be scheduled, you will be contacted by [facility] and/or [facility].  Questions?  Please do not hesitate to call Vertell Pont, BSN, RN at [phone] with any questions or concerns.  Vertell is your Oncology Nurse Navigator and is available to assist you while youre receiving your medical care at [facility].  "

## 2024-09-15 ENCOUNTER — Encounter: Payer: Self-pay | Admitting: Radiation Oncology

## 2024-09-15 ENCOUNTER — Inpatient Hospital Stay

## 2024-09-15 ENCOUNTER — Ambulatory Visit
Admission: RE | Admit: 2024-09-15 | Discharge: 2024-09-15 | Disposition: A | Discharge status: Home / Self Care | Source: Ambulatory Visit | Attending: Radiation Oncology | Admitting: Radiation Oncology

## 2024-09-15 ENCOUNTER — Encounter: Payer: Self-pay | Admitting: Urology

## 2024-09-15 ENCOUNTER — Inpatient Hospital Stay: Admitting: Genetic Counselor

## 2024-09-15 VITALS — BP 119/76 | HR 101 | Temp 97.2°F | Resp 20 | Ht 70.0 in | Wt 170.6 lb

## 2024-09-15 DIAGNOSIS — E2740 Unspecified adrenocortical insufficiency: Secondary | ICD-10-CM | POA: Insufficient documentation

## 2024-09-15 DIAGNOSIS — Z85118 Personal history of other malignant neoplasm of bronchus and lung: Secondary | ICD-10-CM | POA: Insufficient documentation

## 2024-09-15 DIAGNOSIS — C61 Malignant neoplasm of prostate: Secondary | ICD-10-CM

## 2024-09-15 DIAGNOSIS — E039 Hypothyroidism, unspecified: Secondary | ICD-10-CM | POA: Insufficient documentation

## 2024-09-15 DIAGNOSIS — C039 Malignant neoplasm of gum, unspecified: Secondary | ICD-10-CM | POA: Diagnosis not present

## 2024-09-15 HISTORY — DX: Malignant neoplasm of prostate: C61

## 2024-09-15 HISTORY — DX: Elevated prostate specific antigen (PSA): R97.20

## 2024-09-15 NOTE — Assessment & Plan Note (Addendum)
 Treatment is recommended. Patient will consider his options. Follow up with [facility] later this week. He is having genetic testing there. Calcium  (1000-1200 mg daily from food and supplements) and vitamin D3 (1000 IU daily) Healthy lifestyle to prevent diabetes and CV disease Weight-bearing exercises (30 minutes per day) Limit alcohol consumption and avoid smoking

## 2024-09-15 NOTE — Progress Notes (Signed)
 "  Radiation Oncology         [phone] ________________________________  Multidisciplinary Prostate Cancer Clinic  Initial Radiation Oncology Consultation  Name: Jeffery Cobb MRN: [id number]  Date: 09/15/2024  DOB: 1956-04-15  RR:Xnpmjoj, Dibas, MD  Jeffery Sherwood [name], MD   REFERRING PHYSICIAN: Carolee Sherwood [name], MD  DIAGNOSIS: 69 y.o. 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 Malignant neoplasm of prostate (HCC)  C61       HISTORY OF PRESENT ILLNESS::Jeffery Cobb is a 69 y.o. gentleman.  He is an established urology patient, followed by Dr. Carolee and previously treated for Peyronie's disease and hypogonadism, on testosterone replacement therapy until January 2026.  More recently, he was noted to have an elevated PSA of 6.4, up from 3.7 in 2024.  Digital rectal exam was without concerning findings but a prostate MRI performed 07/01/2024, showed a PI-RADS 4 lesion in the left posterior mid peripheral zone and a PI-RADS 3 in the posterior left and right apex.  A repeat PSA on 07/30/2024 remained elevated at 7.2, so the patient proceeded to MRI fusion transrectal ultrasound with 13 biopsies of the prostate on 08/07/2024.  The prostate volume measured 29 cc.  Out of 13 core biopsies, 5 were positive, all on the left.  The maximum Gleason score was 4+5, and this was seen in all 5 of the positive cores.  He was started on Orgovyx ADT on 08/24/2024 and had a PSMA PET scan on 09/04/2024 to complete disease staging.  This did show a dominant intraprostatic lesion (DIL) on the left that correlates with the MRI images and pathology findings, but no evidence of metastatic disease.    The patient reviewed the biopsy and imaging results with his urologist and he has kindly been referred to the multidisciplinary prostate cancer clinic today, for presentation of pathology and radiology studies in our conference and discussion  of potential treatment options.   Of note, he has a history of stage IV lung cancer, poorly differentiated adenocarcinoma, involving the lungs, liver, bone and brain, diagnosed in 2014 and treated at [facility].  He had SRS brain radiation and postop radiation to the left femur followed by immunotherapy with Nivo/Ipi, on clinical trial at [facility].  His treatment was completed in 2016 and he has remained in close follow-up with his oncology team at [facility], without evidence of disease recurrence since that time.  PREVIOUS RADIATION THERAPY: Yes  05/14/13: SRS brain to  a right fronto-parietal metastasis to 18 Gy in a single fraction  03/13/13:  SRS brain to a right anterior frontal and left  frontal metastasis to 20 Gy in a single fraction  01/26/13 - 01/30/13: postop SBRT to Lhip/proximal femur with 20 Gy in 5 fractions of 4 Gy each.  01/23/2013:  SRS Brain to 4 lesions (frontal parasaggital cluster, right parietotemporal, left frontoparietal) treated to 20 Gy in a single fraction.  PAST MEDICAL HISTORY:  has a past medical history of Anxiety, Bipolar affective disorder (HCC), CAD (coronary artery disease), Cancer (HCC), Depression, Depression, Elevated PSA, Hypercholesterolemia, Hypopituitarism, Hypothyroidism (acquired), Malignant neoplasm of right lung, unspecified part of lung (HCC), and Prostate cancer (HCC).    PAST SURGICAL HISTORY: Past Surgical History:  Procedure Laterality Date   BIOPSY OF SKIN SUBCUTANEOUS TISSUE AND/OR MUCOUS MEMBRANE  07/21/2024   Procedure: BIOPSY, SKIN, SUBCUTANEOUS TISSUE, OR MUCOUS MEMBRANE;  Surgeon: Jeffery Specking, MD;  Location:  [facility];  Service: Gastroenterology;;   COLONOSCOPY N/A 07/21/2024   Procedure: COLONOSCOPY;  Surgeon: Jeffery Specking, MD;  Location: [facility];  Service: Gastroenterology;  Laterality: N/A;   ESOPHAGOGASTRODUODENOSCOPY N/A 07/21/2024   Procedure: EGD (ESOPHAGOGASTRODUODENOSCOPY);  Surgeon: Jeffery Specking, MD;  Location: [facility];  Service: Gastroenterology;  Laterality: N/A;   JOINT REPLACEMENT Left 6.20/2018   hip   PROSTATE BIOPSY     TOTAL HIP ARTHROPLASTY Left 11/2016    FAMILY HISTORY: family history includes Breast cancer in his sister; Lung cancer in his mother.  SOCIAL HISTORY:  reports that he quit smoking about 39 years ago. His smoking use included cigarettes. He uses smokeless tobacco. He reports that he does not drink alcohol and does not use drugs.  ALLERGIES: Patient has no known allergies.  MEDICATIONS:  Current Outpatient Medications  Medication Sig Dispense Refill   pregabalin (LYRICA) 50 MG capsule Take 50 mg by mouth daily.     amphetamine-dextroamphetamine (ADDERALL XR) 20 MG 24 hr capsule Take 20 mg by mouth daily.     CAPLYTA 42 MG capsule Take 42 mg by mouth daily.     predniSONE (DELTASONE) 1 MG tablet Take by mouth.     predniSONE (DELTASONE) 5 MG tablet Take 5 mg by mouth daily at 6 (six) AM.     rosuvastatin  (CRESTOR ) 10 MG tablet Take 10 mg by mouth at bedtime.     tamsulosin (FLOMAX) 0.4 MG CAPS capsule Take by mouth.     No current facility-administered medications for this encounter.    REVIEW OF SYSTEMS:  On review of systems, the patient reports that he is doing well overall. He denies any chest pain, shortness of breath, cough, fevers, chills, night sweats, unintended weight changes. He denies any bowel disturbances, and denies abdominal pain, nausea or vomiting. He denies any new musculoskeletal or joint aches or pains. His IPSS was 10, indicating moderate urinary symptoms. He is not currently sexually active. A complete review of systems is obtained and is otherwise negative.   PHYSICAL EXAM:  Wt Readings from Last 3 Encounters:  09/15/24 170 lb 9.6 oz (77.4 kg)  09/08/24 165 lb (74.8 kg)  07/21/24 175 lb (79.4 kg)   Temp Readings from Last 3 Encounters:  09/15/24 (!) 97.2 F (36.2 C)  07/21/24 (!) 97.4 F (36.3 C) (Temporal)  11/09/23 97.9 F (36.6 C)    BP Readings from Last 3 Encounters:  09/15/24 119/76  09/08/24 119/74  07/21/24 109/65   Pulse Readings from Last 3 Encounters:  09/15/24 (!) 101  09/08/24 86  07/21/24 71   Pain Assessment Pain Score: 3  Pain Loc: Back (lower)/10  In general this is a well appearing Caucasian man in no acute distress. He's alert and oriented x4 and appropriate throughout the examination. Cardiopulmonary assessment is negative for acute distress and he exhibits normal effort.    KPS = 100  100 - Normal; no complaints; no evidence of disease. 90   - Able to carry on normal activity; minor signs or symptoms of disease. 80   - Normal activity with effort; some signs or symptoms of disease. 47   - Cares for self; unable to carry on normal activity or to do active work. 60   - Requires occasional assistance, but is able to care for most of his personal needs. 50   - Requires considerable assistance and frequent medical care. 40   - Disabled; requires special care and assistance. 30   - Severely disabled;  hospital admission is indicated although death not imminent. 20   - Very sick; hospital admission necessary; active supportive treatment necessary. 10   - Moribund; fatal processes progressing rapidly. 0     - Dead  Karnofsky DA, Abelmann WH, Craver LS and Burchenal [facility] [phone]) The use of the nitrogen mustards in the palliative treatment of carcinoma: with particular reference to bronchogenic carcinoma Cancer 1 634-56   LABORATORY DATA:  Lab Results  Component Value Date   WBC 11.1 (H) 11/09/2023   HGB 12.0 (L) 11/09/2023   HCT 38.5 (L) 11/09/2023   MCV 80.9 11/09/2023   PLT 282 11/09/2023   Lab Results  Component Value Date   NA 139 11/09/2023   K 3.7 11/09/2023   CL 100 11/09/2023   CO2 35 (H) 11/09/2023   Lab Results  Component Value Date   ALT 13 (L) 03/29/2017   AST 17 03/29/2017   ALKPHOS 56 03/29/2017   BILITOT 0.8 03/29/2017     RADIOGRAPHY: NM PET (PSMA) SKULL TO MID  THIGH Result Date: 09/08/2024 EXAM: PROSTATE PET SKULL BASE TO MID THIGHS 09/04/2024 04:14:18 PM TECHNIQUE: RADIOPHARMACEUTICAL: 8.19 mCi F-18 flotufolastaf (Posluma ) injected intravenously. PET imaging was obtained from skull vertex to mid thighs. Computed tomography was used for attenuation correction and localization. Fusion imaging was obtained. COMPARISON: MRI dated 07/01/2024. CLINICAL HISTORY: PSA: 7.2 on 07/30/2024. Patient reports biopsy in December 2025, no recent surgeries or treatments. Colonoscopy 07/21/2024. MR PROSTATE W WO CONTRAST. FINDINGS: PROSTATE AND PROSTATE BED: Focal activity in the posterior left lobe of the prostate gland. Findings consistent with primary prostate carcinoma and corresponds to comparison MRI dated 07/01/2024. LYMPH NODES: No PSMA avid pelvic lymph nodes. No PSMA avid abdominal lymph nodes. BONES: No PSMA avid skeletal metastases. Degenerative change at L2-L1. Healed posterior right rib fractures with callus formation. No PSMA activity. OTHER FINDINGS: Physiologic activity within the salivary glands, liver, spleen, kidneys, bowel, and urinary bladder. ARTIFACTS: Significant streak artifact from the left hip prosthetic. IMPRESSION: 1. Focal activity in the posterior left lobe of the prostate gland, consistent with primary prostate carcinoma, corresponding to comparison MRI from July 01, 2024. 2. No PSMA-avid pelvic or abdominal lymph nodes. 3. No PSMA-avid skeletal metastases. 4. Evaluation of the pelvis is limited by streak artifact from the left hip prosthesis. Electronically signed by: Norleen Boxer MD 09/08/2024 10:23 AM EST RP Workstation: [id number]      IMPRESSION/PLAN: 69 y.o. gentleman with Stage T1c adenocarcinoma of the prostate with a Gleason's score of 4+5 and a PSA of 7.2  We discussed the patient's workup and outlined the nature of prostate cancer in this setting. The patient's T stage, Gleason's score, and PSA put him into the high risk group.  Accordingly, he is eligible for a variety of potential treatment options including LT-ADT concurrent with either 8 weeks of external radiation or 5 weeks of external radiation with an upfront brachytherapy boost, or prostatectomy. We discussed the available radiation techniques, and focused on the details and logistics of delivery. We discussed and outlined the risks, benefits, short and long-term effects associated with radiotherapy and compared and contrasted these with prostatectomy. We discussed the role of rectal spacer gel in reducing the rectal toxicity associated with radiotherapy. We also detailed the role of ADT in the treatment of high risk prostate cancer and outlined the associated side effects that could be expected with this therapy. He has already started Orgovyx ADT on 08/24/2024 and appears to have a good understanding of his disease and  our treatment recommendations which are of curative intent.  He was encouraged to ask questions that were answered to his stated satisfaction.  At the end of the conversation the patient is undecided regarding his treatment preference and has an upcoming visit in the multidisciplinary clinic at [facility] on 09/18/2024, for a second opinion since he has an established oncology team there associated with his previous stage IV lung cancer treatment.  He has our contact information and will let us  know if he ultimately elects to pursue one of the radiation options here in [place] and we will move forward with treatment planning accordingly at that time.  We enjoyed meeting with him today and look forward to continuing to participate in his care.  We personally spent 60 minutes in this encounter including chart review, reviewing radiological studies, meeting face-to-face with the patient, entering orders and completing documentation.  Dr. Patrcia participated in this consultation remotely due to his own personal illness, and Theresa Dohrman completed the in-person component.   Dr. Patrcia participated fully in the multidisciplinary conference prior to the in-person visit.    Sabra MICAEL Rusk, PA-C    Donnice Patrcia, MD  [facility]  Radiation Oncology Direct Dial: [phone]  Fax: [phone] [email or web address]  Skype  LinkedIn    "

## 2024-09-17 ENCOUNTER — Encounter: Payer: Self-pay | Admitting: Physical Therapy

## 2024-09-17 ENCOUNTER — Ambulatory Visit: Admitting: Physical Therapy

## 2024-09-17 DIAGNOSIS — G8929 Other chronic pain: Secondary | ICD-10-CM | POA: Diagnosis not present

## 2024-09-17 DIAGNOSIS — R252 Cramp and spasm: Secondary | ICD-10-CM

## 2024-09-17 DIAGNOSIS — M5459 Other low back pain: Secondary | ICD-10-CM

## 2024-09-17 DIAGNOSIS — M6281 Muscle weakness (generalized): Secondary | ICD-10-CM

## 2024-09-17 NOTE — Therapy (Signed)
 " OUTPATIENT PHYSICAL THERAPY THORACOLUMBAR TREATMENT   Patient Name: Jeffery Cobb MRN: [id number] DOB:01-17-1956, 69 y.o., male Today's Date: 09/17/2024  END OF SESSION:  PT End of Session - 09/17/24 1659     Visit Number 4    Date for Recertification  10/20/24    Authorization Type Medicare/ [employer]na Supplement    Progress Note Due on Visit 10    PT Start Time 1617    PT Stop Time 1658    PT Time Calculation (min) 41 min    Activity Tolerance Patient tolerated treatment well    Behavior During Therapy WFL for tasks assessed/performed             Past Medical History:  Diagnosis Date   Anxiety    Bipolar affective disorder (HCC)    CAD (coronary artery disease)    Cancer (HCC)    lung   Depression    Depression    Elevated PSA    Hypercholesterolemia    Hypopituitarism    Hypothyroidism (acquired)    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   Malignant neoplasm of right lung, unspecified part of lung (HCC)    Metastatic lung cancer (metastasis from lung to other site) (HCC) 07/13/2016   Prostate cancer [facility])    Past Surgical History:  Procedure Laterality Date   BIOPSY OF SKIN SUBCUTANEOUS TISSUE AND/OR MUCOUS MEMBRANE  07/21/2024   Procedure: BIOPSY, SKIN, SUBCUTANEOUS TISSUE, OR MUCOUS MEMBRANE;  Surgeon: Dianna Specking, MD;  Location: [facility];  Service: Gastroenterology;;   COLONOSCOPY N/A 07/21/2024   Procedure: COLONOSCOPY;  Surgeon: Dianna Specking, MD;  Location: [facility];  Service: Gastroenterology;  Laterality: N/A;   ESOPHAGOGASTRODUODENOSCOPY N/A 07/21/2024   Procedure: EGD (ESOPHAGOGASTRODUODENOSCOPY);  Surgeon: Dianna Specking, MD;  Location: [facility];  Service: Gastroenterology;  Laterality: N/A;   JOINT REPLACEMENT Left 6.20/2018   hip   PROSTATE BIOPSY     TOTAL HIP ARTHROPLASTY Left 11/2016   Patient Active Problem List   Diagnosis Date Noted   Malignant neoplasm of prostate (HCC) 09/15/2024   Anemia  07/21/2024   SAH (subarachnoid hemorrhage) (HCC) 12/29/2017   Pancolitis 03/29/2017   C. difficile colitis 03/29/2017   H/O: lung cancer 03/29/2017   ICH (intracerebral hemorrhage) (HCC) 07/14/2016   Portal vein thrombosis 07/14/2016   Short-term memory loss 07/14/2016   Adrenal insufficiency 07/13/2013    PCP: Regino Slater, MD   REFERRING PROVIDER: Ivonne Verneita Jansky, PA  REFERRING DIAG: M54.50 (ICD-10-CM) - Low back pain, unspecified G89.29 (ICD-10-CM) - Other chronic pain  Rationale for Evaluation and Treatment: Rehabilitation  THERAPY DIAG:  Other low back pain  Cramp and spasm  Muscle weakness (generalized)  ONSET DATE: Chronic  SUBJECTIVE:  SUBJECTIVE STATEMENT: Patient reports he is doing okay today. No pain currently. He had pain this morning and doing his exercises helped.  From Eval: Patient presents with low back pain that is chronic and over the last few years he has been getting injections over the last 6 months. He does not have use of his left quadriceps and that has caused him a lot of issues. 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 Before he was able to walk 5 miles a day and right now he is limited 2-20miles. He has increased pain in the mornings and it is really intense and it spasms. After mobility (walking, working out) his back feels better.  When he wakes up in the morning he uses his walker because it helps him move around better. [facility] swim[facility], walks[facility]   From 08/03/2024 MD Note: In remission from cancer for 11 years, no longer fitting the palliative care criteria. Oncologist has lost interest due to remission status. Current focus is on managing chronic back pain rather than cancer-related issues.    PERTINENT HISTORY:  Lung cancer  metastatic to bone; depression; anxiety; bipolar disorder; Lt hip replacement; patient reports recent prostate cancer diagnosis (08/24/2024)  PAIN:  Are you having pain? Yes: NPRS scale: 4(currently) 8(worst)/10 Pain location: right side of lumbar spine Pain description: joint of electricity; spasm Aggravating factors: reaching; standing , walking  Relieving factors: movement  PRECAUTIONS: None  RED FLAGS: None   WEIGHT BEARING RESTRICTIONS: No  FALLS:  Has patient fallen in last 6 months? No  LIVING ENVIRONMENT: Lives with: lives alone Lives in: House/apartment Stairs: No Has following equipment at home: Vannie - 4 wheeled and Walking poles  OCCUPATION: Retired- Airline Pilot  PLOF: Independent, Independent with basic ADLs, Independent with household mobility with device, Independent with community mobility with device, Independent with community mobility without device, Independent with gait, Independent with transfers, and Leisure: Travel, Agricultural Consultant   PATIENT GOALS: Figure out a way to strengthen left leg  NEXT MD VISIT: March 2026  OBJECTIVE:  Note: Objective measures were completed at Evaluation unless otherwise noted.  DIAGNOSTIC FINDINGS:  08/25/2020 IMPRESSION: 1. Central compression deformity of the superior endplate of L2, new since 03/29/2017. 2. Chronic mild L3 compression deformity.  PATIENT SURVEYS:  Modified Oswestry: 18/50 36%  Interpretation of scores: Score Category Description  0-20% Minimal Disability The patient can cope with most living activities. Usually no treatment is indicated apart from advice on lifting, sitting and exercise  21-40% Moderate Disability The patient experiences more pain and difficulty with sitting, lifting and standing. Travel and social life are more difficult and they may be disabled from work. Personal care, sexual activity and sleeping are not grossly affected, and the patient can usually be managed by conservative means  41-60%  Severe Disability Pain remains the main problem in this group, but activities of daily living are affected. These patients require a detailed investigation  61-80% Crippled Back pain impinges on all aspects of the patients life. Positive intervention is required  81-100% Bed-bound These patients are either bed-bound or exaggerating their symptoms  [name] [name] [name], et al. Surgery versus conservative management of stable thoracolumbar fracture: the PRESTO feasibility RCT. [place] ([place]): [employer]; 2021 Nov. [facility] Technology Assessment, No. 25.62.) Appendix 3, Oswestry Disability Index category descriptors. Available from: Findjewelers.cz  Minimally Clinically Important Difference (MCID) = 12.8%  COGNITION: Overall cognitive status: Within functional limits for tasks assessed  SENSATION: WFL  MUSCLE LENGTH: Hamstrings: decreased bilateral    POSTURE: rounded shoulders and forward head  PALPATION: Increased muscle spasms throughout erector spinae Tspine & Lt spine No pain with Grade II joint mobs in lumbar spine  LUMBAR ROM:   AROM eval  Flexion   Extension   Right lateral flexion   Left lateral flexion   Right rotation   Left rotation    (Blank rows = not tested)  LOWER EXTREMITY ROM:   Decreased hip PROM bilateral no pain   LOWER EXTREMITY MMT:    MMT Right eval Left eval  Hip flexion 4+ 4-  Hip extension    Hip abduction 4+ 4-  Hip adduction 4+ 4-  Hip internal rotation    Hip external rotation    Knee flexion 4+ 4  Knee extension 4+ 4-  Ankle dorsiflexion    Ankle plantarflexion    Ankle inversion    Ankle eversion     (Blank rows = not tested)    FUNCTIONAL TESTS:  5 times sit to stand: 15.52 sec no UE support Timed up and go (TUG): 15.94 sec  GAIT:  Comments: decreased heel contact on Lt ; Lt knee hyperextension in stance;   TREATMENT DATE:  09/17/2024 Supine bridges 2 x 10 with red  loop around knees SL clamshell with red loop 2 x 10 bilateral  Supine SLR 2 x 10 bilateral 2#AW SL hip abduction with 2# AE 2 x 10 bilateral  Staggered sit to stand holding 8# DB x 10 each LE then x 10 regular Seated hamstring stretch 2 x 30 sec bilateral  Side stepping with yellow loop around knees x 3 laps x 10 ft Standing march with unilateral 8 # DB hold x 12 bilateral  Seated TA activation + ball squeeze (cues for posture) x 20 Hip matrix (abduction & extension ) 25# x 12 bilateral  3 way stability ball stretch x 8 each direction Supine hamstring stretch with green strap 2 x 30 sec bilateral  Review and update of HEP    09/10/2024 Supine bridges 2 x 10 with purple ball squeeze Supine SLR 2 x 10 bilateral  Prone hip extension 2 x 10 bilateral (cues to press pelvis into table to decrease discomfort in back) Prone cervical rotation & flexion and extension x 10 each direction Side stepping with yellow loop around knees x 3 laps x 10 ft Standing hip abduction with yellow loop around knees x 12 bilateral  Standing hip extension with yellow loop around knees x 12 bilateral  Staggered sit to stand holding 8# DB x 10 each LE Standing march with unilateral 8 # DB hold x 12 bilateral (weight at shoulder height) Standing hinge x 10 (max verbal and visual cues) Standing RDL with 8# DB x 10 (max verbal and visual cues) 3 way stability ball stretch x 8 each direction Pallof press with green TB x15 each direction Supine LTR x 10 bilateral  Standing trunk rotation with green TB x10 each direction Supine hamstring stretch with green strap 2 x 30 sec bilateral     09/04/2024 Seated hamstring stretch  x 30 sec bilateral (this aggravated patient's back) Supine hamstring stretch with green strap 2 x 30 sec bilateral  Supine LTR x 10 bilateral  Supine hip internal/external rotation x 10 bilateral  Supine SLR 2 x 10 bilateral  Supine bridges 2 x 10 with purple ball squeeze Side stepping with  yellow loop around knees x 3 laps x 10 ft  Seated clam with yellow loop 2 x 10 bilateral (doing one leg at a time) Pallof press with green TB x12 each direction 3 way stability ball stretch x 8 each direction Review and update of HEP                                                                                                                             PATIENT EDUCATION:  Education details: PT eval findings, anticipated POC, progress with PT, and initial HEP  Person educated: Patient Education method: Explanation, Demonstration, and Handouts Education comprehension: verbalized understanding, returned demonstration, and needs further education  HOME EXERCISE PROGRAM: Access Code: [email or web address] URL: [email or web address] Date: 09/17/2024 Prepared by: Kristeen Sar  Exercises - Supine Lower Trunk Rotation  - 1 x daily - 7 x weekly - 1 sets - 8-10 reps - Supine Hip Internal and External Rotation  - 1 x daily - 7 x weekly - 1 sets - 8-10 reps - 5 hold - Sidelying Thoracic Rotation with Open Book  - 1 x daily - 7 x weekly - 1 sets - 10 reps - Seated Hamstring Stretch  - 1 x daily - 7 x weekly - 2 sets - 20-30 hold - Seated Quadratus Lumborum Stretch in Chair  - 1 x daily - 7 x weekly - 3 sets - 10 hold - Seated Cervical Rotation AROM  - 1 x daily - 7 x weekly - 1 sets - 5 reps - 5 hold - Seated Upper Trapezius Stretch  - 1 x daily - 7 x weekly - 2 sets - 20-30s hold - Seated Cervical Flexion Stretch with Finger Support Behind Neck  - 1 x daily - 7 x weekly - 2 sets - 10 reps - Side Stepping with Resistance at Thighs  - 1 x daily - 7 x weekly - 3 sets - 5 reps - Seated Hip Abduction with Resistance  - 1 x daily - 7 x weekly - 2 sets - 10 reps - Bridge with Hip Abduction and Resistance  - 1 x daily - 7 x weekly - 2 sets - 10 reps - Clamshell with Resistance  - 1 x daily - 7 x weekly - 2 sets - 10 reps - Sidelying Hip Abduction  - 1 x daily - 7 x weekly - 2 sets - 10  reps  ASSESSMENT:  CLINICAL IMPRESSION: Sal is progressing very well with skilled therapy. He verbalized feeling 20% better since starting skilled therapy. When his back pain is increased it is relieved with HEP exercises. He continues to verbalize increased neck stiffness. Educated patient on the potential benefit of dry needling or getting a massage. He has had DN in the past and did not experience notable improvements.  He tolerated strength progressions well today. He continues to be challenged on left lower extremity strengthening exercises. Updated HEP to include exercise progressions. Patient is highly motivated and enjoys the challenge.     OBJECTIVE IMPAIRMENTS: Abnormal  gait, decreased balance, decreased mobility, difficulty walking, decreased ROM, decreased strength, hypomobility, increased muscle spasms, impaired flexibility, postural dysfunction, and pain.   ACTIVITY LIMITATIONS: carrying, lifting, bending, standing, squatting, stairs, transfers, bed mobility, dressing, and hygiene/grooming  PARTICIPATION LIMITATIONS: cleaning, laundry, interpersonal relationship, and community activity  PERSONAL FACTORS: Time since onset of injury/illness/exacerbation and 3+ comorbidities: Lung cancer metastatic to bone; depression; anxiety; bipolar disorder are also affecting patient's functional outcome.   REHAB POTENTIAL: Good  CLINICAL DECISION MAKING: Evolving/moderate complexity  EVALUATION COMPLEXITY: Moderate   GOALS: Goals reviewed with patient? Yes  SHORT TERM GOALS: Target date: 09/22/2024  Patient will be independent with initial HEP. Baseline:  Goal status: MET 09/10/2024  2.  Patient will report > or = to 30% improvement in low back pain since starting PT. Baseline:  Goal status: IN PROGRESS (20%) 09/17/2024  3.  Patient will verbalized compliance with stretching after physical activity.  Baseline:  Goal status: MET 09/17/2024   LONG TERM GOALS: Target date:  10/20/2024  Patient will demonstrate independence in advanced HEP. Baseline:  Goal status: INITIAL  2.  Patient will report > or = to 60% improvement in low back pain since starting PT. Baseline:  Goal status: INITIAL  3.  Patient will verbalize and demonstrate self-care strategies to manage pain including tissue mobility practices and change of position. Baseline:  Goal status: INITIAL   4.  Patient will perform 5STS in < or = to 11 sec due to improved LE strength and functional mobility.  Baseline: 15.52 Goal status: INITIAL  5.  Patient will perform TUG in < or = to 12 sec to decrease fall risk. Baseline: 15.94 Goal status: INITIAL  6.  Patient will be able to carry at least 10-15# to help his ability to bring in groceries safely and without a loss of balance. Baseline:  Goal status: INITIAL  PLAN:  PT FREQUENCY: 1-2x/week  PT DURATION: 8 weeks  PLANNED INTERVENTIONS: 97164- PT Re-evaluation, 97110-Therapeutic exercises, 97530- Therapeutic activity, [id number]- Neuromuscular re-education, 97535- Self Care, [id number]- Manual therapy, [id number]- Gait training, [phone]- Canalith repositioning, [id number]- Aquatic Therapy, [id number]- Splinting, [id number]- Electrical stimulation (unattended), [phone]- Electrical stimulation (manual), [id number]- Vasopneumatic device, [id number]- Traction (mechanical), [id number]- Ionotophoresis 4mg /ml Dexamethasone , [id number] (1-2 muscles), [id number] (3+ muscles)- Dry Needling, Patient/Family education, Balance training, Stair training, Taping, Joint mobilization, Joint manipulation, Spinal manipulation, Spinal mobilization, Vestibular training, Cryotherapy, and Moist heat.  PLAN FOR NEXT SESSION:asess tolerance to treatment session & updated hep ; lumbar & hip mobility; left LE strengthening (abductors, hip flexors, extensors)    Kristeen Sar, PT, DPT 09/17/24 5:00 PM [facility] [address], [address] [place], [place] [address] Phone # [phone] Fax  [phone]   "

## 2024-09-22 NOTE — Progress Notes (Signed)
 RN left message for call back to follow up after recent [facility] appointment and second opinion at [facility] on 09/18/24.   Patient does have a follow up with urologist, Dr. Carolee, on 09/29/24.  RN will follow up again after this date to review treatment decision.

## 2024-09-24 ENCOUNTER — Ambulatory Visit: Admitting: Physical Therapy

## 2024-09-24 ENCOUNTER — Telehealth: Payer: Self-pay | Admitting: Physical Therapy

## 2024-09-24 NOTE — Progress Notes (Signed)
 RN spoke with patient and he still remains undecided between his treatment options at this time.    Patient was inquiring about information on Ostarine, which is not FDA approved.   RN encouraged patient to review with urologist at upcoming follow up on 2/10.    Patient agreeable for continued follow up after urology follow up.

## 2024-09-24 NOTE — Telephone Encounter (Signed)
 Left patient a message about missed appointment. Reminded patient of next scheduled appointment and left a call back number to the clinic.  Kristeen Sar, PT, DPT 09/24/24 4:44 PM

## 2024-10-01 ENCOUNTER — Ambulatory Visit: Admitting: Physical Therapy

## 2024-10-08 ENCOUNTER — Ambulatory Visit: Admitting: Physical Therapy

## 2024-10-15 ENCOUNTER — Ambulatory Visit: Admitting: Physical Therapy

## 2024-10-22 ENCOUNTER — Ambulatory Visit: Admitting: Physical Therapy

## 2024-10-29 ENCOUNTER — Ambulatory Visit: Admitting: Physical Therapy
